# Patient Record
Sex: Male | Born: 1945 | Race: Black or African American | Hispanic: No | Marital: Married | Smoking: Former smoker
Health system: Southern US, Community
[De-identification: ages and names within clinical notes are randomized; demographics above are authoritative.]

## PROBLEM LIST (undated history)

## (undated) DIAGNOSIS — G8929 Other chronic pain: Secondary | ICD-10-CM

## (undated) DIAGNOSIS — M549 Dorsalgia, unspecified: Secondary | ICD-10-CM

## (undated) DIAGNOSIS — K219 Gastro-esophageal reflux disease without esophagitis: Secondary | ICD-10-CM

## (undated) DIAGNOSIS — K668 Other specified disorders of peritoneum: Secondary | ICD-10-CM

## (undated) DIAGNOSIS — I10 Essential (primary) hypertension: Secondary | ICD-10-CM

## (undated) DIAGNOSIS — K259 Gastric ulcer, unspecified as acute or chronic, without hemorrhage or perforation: Secondary | ICD-10-CM

## (undated) DIAGNOSIS — E119 Type 2 diabetes mellitus without complications: Secondary | ICD-10-CM

## (undated) DIAGNOSIS — G589 Mononeuropathy, unspecified: Secondary | ICD-10-CM

## (undated) DIAGNOSIS — M1990 Unspecified osteoarthritis, unspecified site: Secondary | ICD-10-CM

## (undated) HISTORY — PX: KNEE ARTHROSCOPY: SHX127

---

## 2000-07-02 ENCOUNTER — Emergency Department (HOSPITAL_COMMUNITY): Admission: EM | Admit: 2000-07-02 | Discharge: 2000-07-03 | Payer: Self-pay | Admitting: *Deleted

## 2001-05-29 ENCOUNTER — Ambulatory Visit (HOSPITAL_COMMUNITY): Admission: RE | Admit: 2001-05-29 | Discharge: 2001-05-29 | Payer: Self-pay | Admitting: General Surgery

## 2013-03-20 DIAGNOSIS — K668 Other specified disorders of peritoneum: Secondary | ICD-10-CM

## 2013-03-20 HISTORY — PX: CARPAL TUNNEL RELEASE: SHX101

## 2013-03-20 HISTORY — DX: Other specified disorders of peritoneum: K66.8

## 2013-10-30 ENCOUNTER — Inpatient Hospital Stay (HOSPITAL_COMMUNITY)
Admission: AD | Admit: 2013-10-30 | Discharge: 2013-11-09 | DRG: 357 | Disposition: A | Discharge status: Home / Self Care | Payer: 59 | Source: Other Acute Inpatient Hospital | Attending: Internal Medicine | Admitting: Internal Medicine

## 2013-10-30 ENCOUNTER — Encounter (HOSPITAL_COMMUNITY): Payer: Self-pay | Admitting: General Practice

## 2013-10-30 DIAGNOSIS — K560 Paralytic ileus: Secondary | ICD-10-CM | POA: Diagnosis present

## 2013-10-30 DIAGNOSIS — Z79899 Other long term (current) drug therapy: Secondary | ICD-10-CM | POA: Diagnosis not present

## 2013-10-30 DIAGNOSIS — K567 Ileus, unspecified: Secondary | ICD-10-CM

## 2013-10-30 DIAGNOSIS — E119 Type 2 diabetes mellitus without complications: Secondary | ICD-10-CM | POA: Diagnosis present

## 2013-10-30 DIAGNOSIS — R109 Unspecified abdominal pain: Secondary | ICD-10-CM | POA: Diagnosis present

## 2013-10-30 DIAGNOSIS — E876 Hypokalemia: Secondary | ICD-10-CM | POA: Diagnosis not present

## 2013-10-30 DIAGNOSIS — K9189 Other postprocedural complications and disorders of digestive system: Secondary | ICD-10-CM

## 2013-10-30 DIAGNOSIS — R188 Other ascites: Secondary | ICD-10-CM | POA: Diagnosis present

## 2013-10-30 DIAGNOSIS — F17200 Nicotine dependence, unspecified, uncomplicated: Secondary | ICD-10-CM | POA: Diagnosis present

## 2013-10-30 DIAGNOSIS — IMO0002 Reserved for concepts with insufficient information to code with codable children: Secondary | ICD-10-CM | POA: Diagnosis present

## 2013-10-30 DIAGNOSIS — Y838 Other surgical procedures as the cause of abnormal reaction of the patient, or of later complication, without mention of misadventure at the time of the procedure: Secondary | ICD-10-CM | POA: Diagnosis present

## 2013-10-30 DIAGNOSIS — R1900 Intra-abdominal and pelvic swelling, mass and lump, unspecified site: Secondary | ICD-10-CM | POA: Diagnosis present

## 2013-10-30 DIAGNOSIS — I10 Essential (primary) hypertension: Secondary | ICD-10-CM | POA: Diagnosis present

## 2013-10-30 DIAGNOSIS — K929 Disease of digestive system, unspecified: Secondary | ICD-10-CM | POA: Diagnosis present

## 2013-10-30 DIAGNOSIS — K651 Peritoneal abscess: Principal | ICD-10-CM | POA: Diagnosis present

## 2013-10-30 HISTORY — DX: Gastro-esophageal reflux disease without esophagitis: K21.9

## 2013-10-30 HISTORY — DX: Essential (primary) hypertension: I10

## 2013-10-30 HISTORY — DX: Mononeuropathy, unspecified: G58.9

## 2013-10-30 HISTORY — DX: Type 2 diabetes mellitus without complications: E11.9

## 2013-10-30 LAB — CBC WITH DIFFERENTIAL/PLATELET
Basophils Absolute: 0 10*3/uL (ref 0.0–0.1)
Basophils Relative: 0 % (ref 0–1)
EOS ABS: 0 10*3/uL (ref 0.0–0.7)
EOS PCT: 0 % (ref 0–5)
HCT: 40.3 % (ref 39.0–52.0)
Hemoglobin: 13.7 g/dL (ref 13.0–17.0)
LYMPHS ABS: 2.2 10*3/uL (ref 0.7–4.0)
Lymphocytes Relative: 20 % (ref 12–46)
MCH: 31.3 pg (ref 26.0–34.0)
MCHC: 34 g/dL (ref 30.0–36.0)
MCV: 92 fL (ref 78.0–100.0)
Monocytes Absolute: 0.6 10*3/uL (ref 0.1–1.0)
Monocytes Relative: 5 % (ref 3–12)
Neutro Abs: 8.3 10*3/uL — ABNORMAL HIGH (ref 1.7–7.7)
Neutrophils Relative %: 75 % (ref 43–77)
PLATELETS: 274 10*3/uL (ref 150–400)
RBC: 4.38 MIL/uL (ref 4.22–5.81)
RDW: 13 % (ref 11.5–15.5)
WBC: 11.1 10*3/uL — ABNORMAL HIGH (ref 4.0–10.5)

## 2013-10-30 LAB — GLUCOSE, CAPILLARY
Glucose-Capillary: 242 mg/dL — ABNORMAL HIGH (ref 70–99)
Glucose-Capillary: 277 mg/dL — ABNORMAL HIGH (ref 70–99)

## 2013-10-30 MED ORDER — INSULIN ASPART 100 UNIT/ML ~~LOC~~ SOLN
0.0000 [IU] | Freq: Three times a day (TID) | SUBCUTANEOUS | Status: DC
  Administered 2013-10-31: 5 [IU] via SUBCUTANEOUS

## 2013-10-30 MED ORDER — ONDANSETRON HCL 4 MG/2ML IJ SOLN
4.0000 mg | Freq: Four times a day (QID) | INTRAMUSCULAR | Status: DC | PRN
  Administered 2013-10-31 – 2013-11-07 (×8): 4 mg via INTRAVENOUS
  Filled 2013-10-30 (×9): qty 2

## 2013-10-30 MED ORDER — MORPHINE SULFATE 2 MG/ML IJ SOLN
1.0000 mg | INTRAMUSCULAR | Status: DC | PRN
  Administered 2013-10-31 (×2): 1 mg via INTRAVENOUS
  Filled 2013-10-30 (×3): qty 1

## 2013-10-30 MED ORDER — HYDRALAZINE HCL 20 MG/ML IJ SOLN: 10.0000 mg | INTRAMUSCULAR | Status: DC | PRN

## 2013-10-30 MED ORDER — ACETAMINOPHEN 325 MG PO TABS
650.0000 mg | ORAL_TABLET | Freq: Four times a day (QID) | ORAL | Status: DC | PRN
  Administered 2013-11-02 – 2013-11-09 (×2): 650 mg via ORAL
  Filled 2013-10-30 (×2): qty 2

## 2013-10-30 MED ORDER — METRONIDAZOLE IN NACL 5-0.79 MG/ML-% IV SOLN
500.0000 mg | Freq: Three times a day (TID) | INTRAVENOUS | Status: DC
  Administered 2013-10-31 – 2013-11-09 (×28): 500 mg via INTRAVENOUS
  Filled 2013-10-30 (×29): qty 100

## 2013-10-30 MED ORDER — ONDANSETRON HCL 4 MG PO TABS: 4.0000 mg | ORAL_TABLET | Freq: Four times a day (QID) | ORAL | Status: DC | PRN

## 2013-10-30 MED ORDER — ACETAMINOPHEN 650 MG RE SUPP: 650.0000 mg | Freq: Four times a day (QID) | RECTAL | Status: DC | PRN

## 2013-10-30 MED ORDER — SODIUM CHLORIDE 0.9 % IV SOLN
INTRAVENOUS | Status: DC
  Administered 2013-10-30: via INTRAVENOUS

## 2013-10-30 MED ORDER — CIPROFLOXACIN IN D5W 400 MG/200ML IV SOLN
400.0000 mg | INTRAVENOUS | Status: AC
  Administered 2013-10-31: 400 mg via INTRAVENOUS
  Filled 2013-10-30: qty 200

## 2013-10-30 NOTE — Progress Notes (Signed)
Pt arrive to unit in no s/s of distress via EMS. Pt transported from [facility]. Pt A&Ox4. Pt oriented to unit and room. Whiteboard updated. VS stable. RR elevated - pt in no s/s of distress. BP low - still stable. Report received from previous nurse prior to pt's arrival. Callbell within reach. Will continue to monitor. Admitting doctor paged.

## 2013-10-30 NOTE — Progress Notes (Signed)
ANTIBIOTIC CONSULT NOTE - INITIAL  Pharmacy Consult for cipro Indication: intra-abdominal infection  Not on File  Patient Measurements: Height: 5\' 6"  (167.6 cm) Weight: 155 lb 10.3 oz (70.6 kg) IBW/kg (Calculated) : 63.8   Vital Signs: Temp: 99.2 F (37.3 C) (08/13 2152) Temp src: Oral (08/13 2152) BP: 99/65 mmHg (08/13 2152) Pulse Rate: 88 (08/13 2152) Intake/Output from previous day:   Intake/Output from this shift:    Labs: No results found for this basename: WBC, HGB, PLT, LABCREA, CREATININE,  in the last 72 hours CrCl is unknown because no creatinine reading has been taken. No results found for this basename: VANCOTROUGH, VANCOPEAK, VANCORANDOM, GENTTROUGH, GENTPEAK, GENTRANDOM, TOBRATROUGH, TOBRAPEAK, TOBRARND, AMIKACINPEAK, AMIKACINTROU, AMIKACIN,  in the last 72 hours   Microbiology: No results found for this or any previous visit (from the past 720 hour(s)).  Medical History: Past Medical History  Diagnosis Date  . Hypertension   . Type II diabetes mellitus   . GERD (gastroesophageal reflux disease)   . Pinched nerve     "back" (10/30/2013)    Medications:  See electronic EMR  Assessment: 68 year old male with abdominal abscess ordered empiric cipro and flagyl. No fever noted, wbc normal at 11.1. Renal function normal, pharmacy to sign off as it does not appear that patient will need any further dose adjustments. Reconsult if needed.  Goal of Therapy:  Eradication of infection  Plan:  Cipro 400mg  q12h  Erin Hearing PharmD., BCPS Clinical Pharmacist Pager [phone] 10/30/2013 10:57 PM

## 2013-10-30 NOTE — H&P (Signed)
[facility] History and Physical  METE PURDUM WUJ:[id number] DOB: 1945/04/17 DOA: 10/30/2013  Referring physician: Patient was transferred from [facility] hospital. PCP: Fanny Bien, MD  Chief Complaint: Abdominal pain.  HPI: AMBERS Cross is a 68 y.o. male with history of diabetes mellitus and hypertension had presented to the ER at [facility] with complaints of abdominal pain. Patient has been experiencing abdominal pain over the last one month but it had acutely worsened over last 24 hours and patient also had some nausea vomiting. Patient has had some subjective feeling of fever and chills. Pain most recent left lower quadrant radiating across the abdomen to the right lower quadrant. Patient also has pain in the left lower extremity. CT abdomen pelvis done showed left omental mass with necrosis and patient was transferred to [facility] for further management. On exam patient has left lower quadrant tenderness. Patient's last bowel movement was 2 days ago. Denies any chest pain or shortness of breath. Patient is mildly febrile. Labs done at [facility] were largely unremarkable except for mild leukocytosis. Patient states he has had colonoscopy last year which was unremarkable and as per the patient.   Review of Systems: As presented in the history of presenting illness, rest negative.  Past Medical History  Diagnosis Date  . Hypertension   . Type II diabetes mellitus   . GERD (gastroesophageal reflux disease)   . Pinched nerve     "back" (10/30/2013)   Past Surgical History  Procedure Laterality Date  . Carpal tunnel release Left 03/2013  . Knee arthroscopy Left 1980's   Social History:  reports that he has been smoking Cigarettes.  He has a 56 pack-year smoking history. He has never used smokeless tobacco. He reports that he does not drink alcohol or use illicit drugs. Where does patient live home. Can patient participate in ADLs? Yes.  Not on  File  Family History:  Family History  Problem Relation Age of Onset  . Diabetes Mellitus II Sister   . Stroke Neg Hx       Prior to Admission medications   Medication Sig Start Date End Date Taking? Authorizing Provider  glyBURIDE (DIABETA) 5 MG tablet Take 10 mg by mouth 2 (two) times daily. 09/30/13  Yes Historical Provider, MD  lisinopril (PRINIVIL,ZESTRIL) 40 MG tablet Take 40 mg by mouth daily. 09/27/13  Yes Historical Provider, MD    Physical Exam: Filed Vitals:   10/30/13 2152  BP: 99/65  Pulse: 88  Temp: 99.2 F (37.3 C)  TempSrc: Oral  Resp: 24  Height: 5\' 6"  (1.676 m)  Weight: 70.6 kg (155 lb 10.3 oz)  SpO2: 97%     General:  Moderately built and nourished.  Eyes: Anicteric no pallor.  ENT: No discharge from ears eyes nose mouth.  Neck: No mass felt.  Cardiovascular: S1-S2 heard.  Respiratory: No rhonchi or crepitations.  Abdomen: Soft tenderness in the left lower quadrant with guarding no rigidity. Bowel sounds not appreciated.  Skin: No rash.  Musculoskeletal: No edema. Poor pulses.  Psychiatric: Appears normal.  Neurologic: Alert awake oriented to time place and person. Moves all extremities.  Labs on Admission:  Basic Metabolic Panel: No results found for this basename: NA, K, CL, CO2, GLUCOSE, BUN, CREATININE, CALCIUM, MG, PHOS,  in the last 168 hours Liver Function Tests: No results found for this basename: AST, ALT, ALKPHOS, BILITOT, PROT, ALBUMIN,  in the last 168 hours No results found for this basename: LIPASE, AMYLASE,  in the  last 168 hours No results found for this basename: AMMONIA,  in the last 168 hours CBC: No results found for this basename: WBC, NEUTROABS, HGB, HCT, MCV, PLT,  in the last 168 hours Cardiac Enzymes: No results found for this basename: CKTOTAL, CKMB, CKMBINDEX, TROPONINI,  in the last 168 hours  BNP (last 3 results) No results found for this basename: PROBNP,  in the last 8760 hours CBG:  Recent Labs Lab  10/30/13 2155  GLUCAP 277*    Radiological Exams on Admission: No results found.   Assessment/Plan Principal Problem:   Abdominal abscess Active Problems:   HTN (hypertension)   Diabetes mellitus   Abdominal mass   1. 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 Prince Solian. At this time patient will be kept n.p.o. and since patient has mild fever with leukocytosis I have placed patient on empiric antibiotics. Pain relief medication and gentle hydration. Patient's blood pressure is in the low-normal but does not look septic or toxic at this time. 2. Diabetes mellitus - since patient is n.p.o. I have placed patient on sliding-scale coverage for now. Closely follow CBGs. 3. Hypertension - patient's blood pressure is in the low-normal. Hold antihypertensives. Continue hydration. When necessary IV hydralazine for systolic blood pressure more than 160.  Patient's repeat labs are pending.  Code Status: Full code.  Family Communication: Patient's daughter at the bedside.  Disposition Plan: Admit to inpatient.    Ceceilia Cephus N. [facility] Pager [phone].  If 7PM-7AM, please contact night-coverage [email or web address] Password [email or web address] 10/30/2013, 11:30 PM

## 2013-10-31 DIAGNOSIS — D490 Neoplasm of unspecified behavior of digestive system: Secondary | ICD-10-CM

## 2013-10-31 LAB — BASIC METABOLIC PANEL
Anion gap: 16 — ABNORMAL HIGH (ref 5–15)
BUN: 11 mg/dL (ref 6–23)
CALCIUM: 9.5 mg/dL (ref 8.4–10.5)
CO2: 24 mEq/L (ref 19–32)
Chloride: 98 mEq/L (ref 96–112)
Creatinine, Ser: 0.95 mg/dL (ref 0.50–1.35)
GFR calc Af Amer: >90 mL/min (ref >90)
GFR, EST NON AFRICAN AMERICAN: 85 mL/min — AB (ref >90)
Glucose, Bld: 266 mg/dL — ABNORMAL HIGH (ref 70–99)
Potassium: 4.1 mEq/L (ref 3.7–5.3)
Sodium: 138 mEq/L (ref 137–147)

## 2013-10-31 LAB — GLUCOSE, CAPILLARY
GLUCOSE-CAPILLARY: 275 mg/dL — AB (ref 70–99)
GLUCOSE-CAPILLARY: 207 mg/dL — AB (ref 70–99)
GLUCOSE-CAPILLARY: 211 mg/dL — AB (ref 70–99)
Glucose-Capillary: 245 mg/dL — ABNORMAL HIGH (ref 70–99)
Glucose-Capillary: 183 mg/dL — ABNORMAL HIGH (ref 70–99)
Glucose-Capillary: 213 mg/dL — ABNORMAL HIGH (ref 70–99)

## 2013-10-31 LAB — COMPREHENSIVE METABOLIC PANEL
ALK PHOS: 75 U/L (ref 39–117)
ALT: 15 U/L (ref 0–53)
AST: 10 U/L (ref 0–37)
Albumin: 2.5 g/dL — ABNORMAL LOW (ref 3.5–5.2)
Anion gap: 13 (ref 5–15)
BUN: 10 mg/dL (ref 6–23)
CHLORIDE: 100 meq/L (ref 96–112)
CO2: 25 meq/L (ref 19–32)
Calcium: 9.5 mg/dL (ref 8.4–10.5)
Creatinine, Ser: 0.95 mg/dL (ref 0.50–1.35)
GFR calc Af Amer: >90 mL/min (ref >90)
GFR calc non Af Amer: 85 mL/min — ABNORMAL LOW (ref >90)
Glucose, Bld: 260 mg/dL — ABNORMAL HIGH (ref 70–99)
Potassium: 4.2 mEq/L (ref 3.7–5.3)
SODIUM: 138 meq/L (ref 137–147)
Total Bilirubin: 0.3 mg/dL (ref 0.3–1.2)
Total Protein: 6.5 g/dL (ref 6.0–8.3)

## 2013-10-31 LAB — CBC
HCT: 40.9 % (ref 39.0–52.0)
Hemoglobin: 13.9 g/dL (ref 13.0–17.0)
MCH: 31.4 pg (ref 26.0–34.0)
MCHC: 34 g/dL (ref 30.0–36.0)
MCV: 92.3 fL (ref 78.0–100.0)
PLATELETS: 291 10*3/uL (ref 150–400)
RBC: 4.43 MIL/uL (ref 4.22–5.81)
RDW: 13.1 % (ref 11.5–15.5)
WBC: 10.1 10*3/uL (ref 4.0–10.5)

## 2013-10-31 MED ORDER — INSULIN GLARGINE 100 UNIT/ML ~~LOC~~ SOLN: 12.0000 [IU] | Freq: Every day | SUBCUTANEOUS | Status: DC

## 2013-10-31 MED ORDER — INSULIN ASPART 100 UNIT/ML ~~LOC~~ SOLN
0.0000 [IU] | SUBCUTANEOUS | Status: DC
  Administered 2013-10-31: 2 [IU] via SUBCUTANEOUS
  Administered 2013-10-31 (×2): 3 [IU] via SUBCUTANEOUS
  Administered 2013-11-01 (×2): 2 [IU] via SUBCUTANEOUS
  Administered 2013-11-01: 3 [IU] via SUBCUTANEOUS
  Administered 2013-11-01 (×2): 2 [IU] via SUBCUTANEOUS
  Administered 2013-11-01: 1 [IU] via SUBCUTANEOUS
  Administered 2013-11-01: 2 [IU] via SUBCUTANEOUS
  Administered 2013-11-02 (×5): 1 [IU] via SUBCUTANEOUS
  Administered 2013-11-03: 2 [IU] via SUBCUTANEOUS
  Administered 2013-11-03: 3 [IU] via SUBCUTANEOUS
  Administered 2013-11-03 – 2013-11-05 (×8): 2 [IU] via SUBCUTANEOUS
  Administered 2013-11-05: 1 [IU] via SUBCUTANEOUS
  Administered 2013-11-05 – 2013-11-06 (×5): 2 [IU] via SUBCUTANEOUS
  Administered 2013-11-06: 1 [IU] via SUBCUTANEOUS
  Administered 2013-11-06 (×2): 2 [IU] via SUBCUTANEOUS
  Administered 2013-11-07: 1 [IU] via SUBCUTANEOUS
  Administered 2013-11-07: 3 [IU] via SUBCUTANEOUS
  Administered 2013-11-07: 5 [IU] via SUBCUTANEOUS
  Administered 2013-11-07: 1 [IU] via SUBCUTANEOUS

## 2013-10-31 MED ORDER — HEPARIN SODIUM (PORCINE) 5000 UNIT/ML IJ SOLN
5000.0000 [IU] | Freq: Three times a day (TID) | INTRAMUSCULAR | Status: DC
  Administered 2013-10-31 – 2013-11-02 (×8): 5000 [IU] via SUBCUTANEOUS
  Filled 2013-10-31 (×11): qty 1

## 2013-10-31 MED ORDER — WHITE PETROLATUM GEL
Status: AC
  Administered 2013-10-31: 0.2
  Filled 2013-10-31: qty 5

## 2013-10-31 MED ORDER — SODIUM CHLORIDE 0.9 % IV SOLN
INTRAVENOUS | Status: AC
  Administered 2013-10-31 – 2013-11-01 (×3): via INTRAVENOUS

## 2013-10-31 MED ORDER — INSULIN GLARGINE 100 UNIT/ML ~~LOC~~ SOLN
8.0000 [IU] | Freq: Every day | SUBCUTANEOUS | Status: DC
  Administered 2013-10-31 – 2013-11-01 (×2): 8 [IU] via SUBCUTANEOUS
  Filled 2013-10-31 (×3): qty 0.08

## 2013-10-31 MED ORDER — CIPROFLOXACIN IN D5W 400 MG/200ML IV SOLN
400.0000 mg | Freq: Two times a day (BID) | INTRAVENOUS | Status: DC
  Administered 2013-10-31 – 2013-11-04 (×10): 400 mg via INTRAVENOUS
  Filled 2013-10-31 (×12): qty 200

## 2013-10-31 NOTE — Care Management Note (Signed)
    Page 1 of 1   11/06/2013     11:12:36 AM CARE MANAGEMENT NOTE 11/06/2013  Patient:  Adrian Cross, Adrian Cross   Account Number:  [id number]  Date Initiated:  10/31/2013  Documentation initiated by:  Tomi Bamberger  Subjective/Objective Assessment:   dx tumor abd, n/v  admit- lives with family.     Action/Plan:   Anticipated DC Date:  11/07/2013   Anticipated DC Plan:  [place]  CM consult      Choice offered to / List presented to:             Status of service:  In process, will continue to follow Medicare Important Message given?  YES (If response is "NO", the following Medicare IM given date fields will be blank) Date Medicare IM given:  11/03/2013 Medicare IM given by:  Tomi Bamberger Date Additional Medicare IM given:  11/06/2013 Additional Medicare IM given by:  Tomi Bamberger  Discharge Disposition:  HOME/SELF CARE  Per UR Regulation:  Reviewed for med. necessity/level of care/duration of stay  If discussed at [place] of Stay Meetings, dates discussed:   11/04/2013  11/06/2013    Comments:  11/06/13 [place], BSN [phone] patient with post op ileus, pca dc'd yeste, patient vomited last pm, will cont to try to advance diet.  11/04/13 [place], BSN [phone] s/p exploratory laporatomy and rescetion of omental mass. Conts on pca, sips and clears. NCM will continue to folow for dc needs.

## 2013-10-31 NOTE — Progress Notes (Signed)
Utilization review completed. Nyron Mozer, RN, BSN. 

## 2013-10-31 NOTE — Progress Notes (Signed)
[facility] Progress Note     Subjective: Pt c/o pain in his abdomen most significantly in the LLQ, but mildly all over.  No N/V, having flatus, no BM since 10/28/13.    Objective: Vital signs in last 24 hours: Temp:  [99.2 F (37.3 C)] 99.2 F (37.3 C) (08/14 0423) Pulse Rate:  [88-89] 89 (08/14 0423) Resp:  [20-24] 20 (08/14 0423) BP: (99-103)/(57-65) 103/57 mmHg (08/14 0423) SpO2:  [97 %-98 %] 98 % (08/14 0423) Weight:  [155 lb 10.3 oz (70.6 kg)] 155 lb 10.3 oz (70.6 kg) (08/13 2152) Last BM Date: 10/28/13  Intake/Output from previous day: 08/13 0701 - 08/14 0700 In: 462.5 [I.V.:462.5] Out: 50 [Urine:50] Intake/Output this shift:    PE: Gen:  Alert, NAD, pleasant Abd: Soft, distended, mass in LLQ, very tender to palpation in LLQ, +BS, no HSM, no abdominal scars noted   Lab Results:   Recent Labs  10/30/13 2348 10/31/13 0508  WBC 11.1* 10.1  HGB 13.7 13.9  HCT 40.3 40.9  PLT 274 291   BMET  Recent Labs  10/30/13 2348 10/31/13 0508  NA 138 138  K 4.2 4.1  CL 100 98  CO2 25 24  GLUCOSE 260* 266*  BUN 10 11  CREATININE 0.95 0.95  CALCIUM 9.5 9.5   PT/INR No results found for this basename: LABPROT, INR,  in the last 72 hours CMP     Component Value Date/Time   NA 138 10/31/2013 0508   K 4.1 10/31/2013 0508   CL 98 10/31/2013 0508   CO2 24 10/31/2013 0508   GLUCOSE 266* 10/31/2013 0508   BUN 11 10/31/2013 0508   CREATININE 0.95 10/31/2013 0508   CALCIUM 9.5 10/31/2013 0508   PROT 6.5 10/30/2013 2348   ALBUMIN 2.5* 10/30/2013 2348   AST 10 10/30/2013 2348   ALT 15 10/30/2013 2348   ALKPHOS 75 10/30/2013 2348   BILITOT 0.3 10/30/2013 2348   GFRNONAA 85* 10/31/2013 0508   GFRAA >90 10/31/2013 0508   Lipase  No results found for this basename: lipase       Studies/Results: No results found.  Anti-infectives: Anti-infectives   Start     Dose/Rate Route Frequency Ordered Stop   10/31/13 1000  ciprofloxacin (CIPRO) IVPB 400 mg     400  mg 200 mL/hr over 60 Minutes Intravenous Every 12 hours 10/31/13 0022     10/31/13 0000  metroNIDAZOLE (FLAGYL) IVPB 500 mg     500 mg 100 mL/hr over 60 Minutes Intravenous Every 8 hours 10/30/13 2252     10/31/13 0000  ciprofloxacin (CIPRO) IVPB 400 mg     400 mg 200 mL/hr over 60 Minutes Intravenous NOW 10/30/13 2255 10/31/13 0111       Assessment/Plan 6.4 x 5.1 cm LLQ necrotic omental mass - now causing increasing tenderness and leukocytosis   Plan: 1.  While CT-guided biopsy could be done, it is doubtful that any findings on biopsy would keep him from needing to have this area resected.  2.  Will need exploratory laparotomy with resection of this omental mass within the next few days.  Dr. Donne Hazel and Dr. Georgette Dover to decide timing - likely this weekend. 3.  Keep him NPO for now 4.  WBC normal 5.  Encourage ambulation and IS 6.  SCD's and not currently on blood thinners (dvt proph would be fine, but hold after midnight) 7.  Daughter at bedside - we discussed plans for surgery and they are both agreeable to  proceed with surgery    LOS: 1 day    Adrian Cross, Adrian Cross 10/31/2013, 9:55 AM Pager: [phone]

## 2013-10-31 NOTE — Consult Note (Signed)
Reason for Consult:Left lower quadrant abdominal omental mass with central necrosis Referring Physician: Dr. Sharlet Salina is an 68 y.o. male.  HPI: This is a 68 yo male with DM2 and chronic tobacco use that presents with about a month of LLQ abdominal pain that has become worse over the last couple of days.  The pain has now radiated across to his RLQ.  He feels distended.  His last BM was two days ago.  He reports some vomiting.  He presented to the emergency department at [facility] in [place], [place] where a CT scan showed a 6.4 x 5.1 cm necrotizing mass in the LLQ omentum.  WBC mildly elevated at 10.9.  He was then transferred to the [facility] service for further work-up.  Currently the patient is resting comfortably with minimal pain medication.  Past Medical History  Diagnosis Date  . Hypertension   . Type II diabetes mellitus   . GERD (gastroesophageal reflux disease)   . Pinched nerve     "back" (10/30/2013)    Past Surgical History  Procedure Laterality Date  . Carpal tunnel release Left 03/2013  . Knee arthroscopy Left 1980's    Family History  Problem Relation Age of Onset  . Diabetes Mellitus II Sister   . Stroke Neg Hx     Social History:  reports that he has been smoking Cigarettes.  He has a 56 pack-year smoking history. He has never used smokeless tobacco. He reports that he does not drink alcohol or use illicit drugs.  Allergies: Not on File  Medications:   Prior to Admission medications   Medication Sig Start Date End Date Taking? Authorizing Provider  glyBURIDE (DIABETA) 5 MG tablet Take 10 mg by mouth 2 (two) times daily. 09/30/13  Yes Historical Provider, MD  lisinopril (PRINIVIL,ZESTRIL) 40 MG tablet Take 40 mg by mouth daily. 09/27/13  Yes Historical Provider, MD     Results for orders placed during the hospital encounter of 10/30/13 (from the past 48 hour(s))  GLUCOSE, CAPILLARY     Status: Abnormal   Collection Time     10/30/13  9:55 PM      Result Value Ref Range   Glucose-Capillary 277 (*) 70 - 99 mg/dL   Comment 1 Notify RN     Comment 2 Documented in Chart    COMPREHENSIVE METABOLIC PANEL     Status: Abnormal   Collection Time    10/30/13 11:48 PM      Result Value Ref Range   Sodium 138  137 - 147 mEq/L   Potassium 4.2  3.7 - 5.3 mEq/L   Chloride 100  96 - 112 mEq/L   CO2 25  19 - 32 mEq/L   Glucose, Bld 260 (*) 70 - 99 mg/dL   BUN 10  6 - 23 mg/dL   Creatinine, Ser 0.95  0.50 - 1.35 mg/dL   Calcium 9.5  8.4 - 10.5 mg/dL   Total Protein 6.5  6.0 - 8.3 g/dL   Albumin 2.5 (*) 3.5 - 5.2 g/dL   AST 10  0 - 37 U/L   ALT 15  0 - 53 U/L   Alkaline Phosphatase 75  39 - 117 U/L   Total Bilirubin 0.3  0.3 - 1.2 mg/dL   GFR calc non Af Amer 85 (*) >90 mL/min   GFR calc Af Amer >90  >90 mL/min   Comment: (NOTE)     The eGFR has been calculated using the  CKD EPI equation.     This calculation has not been validated in all clinical situations.     eGFR's persistently <90 mL/min signify possible Chronic Kidney     Disease.   Anion gap 13  5 - 15  CBC WITH DIFFERENTIAL     Status: Abnormal   Collection Time    10/30/13 11:48 PM      Result Value Ref Range   WBC 11.1 (*) 4.0 - 10.5 K/uL   RBC 4.38  4.22 - 5.81 MIL/uL   Hemoglobin 13.7  13.0 - 17.0 g/dL   HCT 40.3  39.0 - 52.0 %   MCV 92.0  78.0 - 100.0 fL   MCH 31.3  26.0 - 34.0 pg   MCHC 34.0  30.0 - 36.0 g/dL   RDW 13.0  11.5 - 15.5 %   Platelets 274  150 - 400 K/uL   Neutrophils Relative % 75  43 - 77 %   Neutro Abs 8.3 (*) 1.7 - 7.7 K/uL   Lymphocytes Relative 20  12 - 46 %   Lymphs Abs 2.2  0.7 - 4.0 K/uL   Monocytes Relative 5  3 - 12 %   Monocytes Absolute 0.6  0.1 - 1.0 K/uL   Eosinophils Relative 0  0 - 5 %   Eosinophils Absolute 0.0  0.0 - 0.7 K/uL   Basophils Relative 0  0 - 1 %   Basophils Absolute 0.0  0.0 - 0.1 K/uL  GLUCOSE, CAPILLARY     Status: Abnormal   Collection Time    10/30/13 11:55 PM      Result Value Ref Range    Glucose-Capillary 242 (*) 70 - 99 mg/dL    CT Chest - Mild bibasilar atelectasis and central peribronchial thickening.  Underlying emphysematous changes.  No evidence of pulmonary tumor or metastases.  CT Abd/ Pelvis - Necrotic mass int he left lower quadrant appears to lie within the omentum.  The lesion could represent metastatic disease although no primary tumor is identified.  Additional differential considerations include GI stromal tumor or sarcoma.  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  Associated small volume of perihepatic and pelvic ascites is noted.  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  Review of Systems  Constitutional: Negative for weight loss.  HENT: Negative for ear discharge, ear pain, hearing loss and tinnitus.   Eyes: Negative for blurred vision, double vision, photophobia and pain.  Respiratory: Negative for cough, sputum production and shortness of breath.   Cardiovascular: Negative for chest pain.  Gastrointestinal: Positive for nausea, vomiting, abdominal pain and constipation.  Genitourinary: Negative for dysuria, urgency, frequency and flank pain.  Musculoskeletal: Negative for back pain, falls, joint pain, myalgias and neck pain.  Neurological: Negative for dizziness, tingling, sensory change, focal weakness, loss of consciousness and headaches.  Endo/Heme/Allergies: Does not bruise/bleed easily.  Psychiatric/Behavioral: Negative for depression, memory loss and substance abuse. The patient is not nervous/anxious.    Blood pressure 99/65, pulse 88, temperature 99.2 F (37.3 C), temperature source Oral, resp. rate 24, height 5' 6"  (1.676 m), weight 155 lb 10.3 oz (70.6 kg), SpO2 97.00%. Physical Exam WDWN in NAD HEENT:  EOMI, sclera anicteric Neck:  No masses, no  thyromegaly Lungs:  CTA bilaterally; normal respiratory effort CV:  Regular rate and rhythm; no murmurs Abd:  +bowel sounds,  distended; tender mostly in LLQ, but also in RLQ; no palpable mass Ext:  Well-perfused; no edema Skin:  Warm, dry; no sign of jaundice  Assessment/Plan: LLQ necrotic omental mass - now causing increasing tenderness and leukocytosis  While CT-guided biopsy could be done, it is doubtful that any findings on biopsy would keep him from needing to have this area resected.  Will discuss further with the daytime Surgeon, but he will likely need exploratory laparotomy with resection of this omental mass within the next few days.    Keep him NPO for now.  Imogene Burn. Georgette Dover, MD, [facility]  General/ Trauma Surgery  10/31/2013 1:01 AM   Katlyn Muldrew K. 10/31/2013, 12:50 AM

## 2013-10-31 NOTE — Progress Notes (Signed)
Agree with Dr Vonna Kotyk note, I think laparotomy with resection indicated.  Will try to prep him today and possibly for surgery tomorrow.

## 2013-10-31 NOTE — Progress Notes (Signed)
Patient Demographics  Adrian Cross, is a 68 y.o. male, DOB - Aug 17, 1945, HQP:[id number]  Admit date - 10/30/2013   Admitting Physician Bonnielee Haff, MD  Outpatient Primary MD for the patient is HILL,CHARLES, MD  LOS - 1   No chief complaint on file.       Subjective:   Adrian Cross today has, No headache, No chest pain, +ve LLQ abdominal pain - No Nausea, No new weakness tingling or numbness, No Cough - SOB.   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 and on IVF + epiric antibiotics. Pain relief medication and gentle hydration. CCS following likely exploratory laparotomy in am.    Diabetes mellitus  2 - since patient is n.p.o. I have placed patient on sliding-scale coverage for now + low dose Lantus. Closely follow CBGs.   CBG (last 3)   Recent Labs  10/30/13 2355 10/31/13 0422 10/31/13 0746  GLUCAP 242* 245* 275*      Hypertension - patient's blood pressure is in the low-normal. Hold antihypertensives. Continue hydration. When necessary IV hydralazine for systolic blood pressure more than 160.      Code Status: full  Family Communication: none  Disposition Plan: home   Procedures Ct abd-pelvis,    Consults CCS   Medications  Scheduled Meds: . ciprofloxacin  400 mg Intravenous Q12H  . heparin subcutaneous  5,000 Units Subcutaneous 3 times per day  . insulin aspart  0-9 Units Subcutaneous TID WC  . metronidazole  500 mg Intravenous Q8H   Continuous Infusions: . sodium chloride     PRN Meds:.acetaminophen, acetaminophen, hydrALAZINE, morphine injection, ondansetron (ZOFRAN) IV, ondansetron  DVT Prophylaxis   Heparin - SCDs    Lab Results  Component Value Date   PLT 291 10/31/2013     Antibiotics     Anti-infectives   Start     Dose/Rate Route Frequency Ordered Stop   10/31/13 1000  ciprofloxacin (CIPRO) IVPB 400 mg     400 mg 200 mL/hr over 60 Minutes Intravenous Every 12 hours 10/31/13 0022     10/31/13 0000  metroNIDAZOLE (FLAGYL) IVPB 500 mg     500 mg 100 mL/hr over 60 Minutes Intravenous Every 8 hours 10/30/13 2252     10/31/13 0000  ciprofloxacin (CIPRO) IVPB 400 mg     400 mg 200 mL/hr over 60 Minutes Intravenous NOW 10/30/13 2255 10/31/13 0111          Objective:   Filed Vitals:   10/30/13 2152 10/31/13 0423  BP: 99/65 103/57  Pulse: 88 89  Temp: 99.2 F (37.3 C) 99.2 F (37.3 C)  TempSrc: Oral Oral  Resp: 24 20  Height: 5\' 6"  (1.676 m)   Weight: 70.6 kg (155 lb 10.3 oz)   SpO2: 97% 98%    Wt Readings from Last 3 Encounters:  10/30/13 70.6 kg (155 lb 10.3 oz)     Intake/Output Summary (Last 24 hours) at 10/31/13 1106 Last data filed at 10/31/13 0601  Gross per 24 hour  Intake  462.5 ml  Output     50 ml  Net  412.5 ml     Physical Exam  Awake  Alert, Oriented X 3, No new F.N deficits, Normal affect [place].AT,PERRAL Supple Neck,No JVD, No cervical lymphadenopathy appriciated.  Symmetrical Chest wall movement, Good air movement bilaterally, CTAB RRR,No Gallops,Rubs or new Murmurs, No Parasternal Heave +ve B.Sounds, Abd Soft, +ve LLQ tenderness, No organomegaly appriciated, No rebound - guarding or rigidity. No Cyanosis, Clubbing or edema, No new Rash or bruise      Data Review   Micro Results No results found for this or any previous visit (from the past 240 hour(s)).  Radiology Reports No results found.  CBC  Recent Labs Lab 10/30/13 2348 10/31/13 0508  WBC 11.1* 10.1  HGB 13.7 13.9  HCT 40.3 40.9  PLT 274 291  MCV 92.0 92.3  MCH 31.3 31.4  MCHC 34.0 34.0  RDW 13.0 13.1  LYMPHSABS 2.2  --   MONOABS 0.6  --   EOSABS 0.0  --   BASOSABS 0.0  --     Chemistries   Recent Labs Lab 10/30/13 2348  10/31/13 0508  NA 138 138  K 4.2 4.1  CL 100 98  CO2 25 24  GLUCOSE 260* 266*  BUN 10 11  CREATININE 0.95 0.95  CALCIUM 9.5 9.5  AST 10  --   ALT 15  --   ALKPHOS 75  --   BILITOT 0.3  --    ------------------------------------------------------------------------------------------------------------------ estimated creatinine clearance is 69 ml/min (by C-G formula based on Cr of 0.95). ------------------------------------------------------------------------------------------------------------------ No results found for this basename: HGBA1C,  in the last 72 hours ------------------------------------------------------------------------------------------------------------------ No results found for this basename: CHOL, HDL, LDLCALC, TRIG, CHOLHDL, LDLDIRECT,  in the last 72 hours ------------------------------------------------------------------------------------------------------------------ No results found for this basename: TSH, T4TOTAL, FREET3, T3FREE, THYROIDAB,  in the last 72 hours ------------------------------------------------------------------------------------------------------------------ No results found for this basename: VITAMINB12, FOLATE, FERRITIN, TIBC, IRON, RETICCTPCT,  in the last 72 hours  Coagulation profile No results found for this basename: INR, PROTIME,  in the last 168 hours  No results found for this basename: DDIMER,  in the last 72 hours  Cardiac Enzymes No results found for this basename: CK, CKMB, TROPONINI, MYOGLOBIN,  in the last 168 hours ------------------------------------------------------------------------------------------------------------------ No components found with this basename: POCBNP,      Time Spent in minutes   35   Adrian Cross K M.D on 10/31/2013 at 11:06 AM  Between 7am to 7pm - Pager - [phone]  After 7pm go to [email or web address] - password [email or web address]  And look for the night coverage person covering for me after  hours  [facility] Office  [phone]   **Disclaimer: This note may have been dictated with voice recognition software. Similar sounding words can inadvertently be transcribed and this note may contain transcription errors which may not have been corrected upon publication of note.**

## 2013-11-01 ENCOUNTER — Inpatient Hospital Stay (HOSPITAL_COMMUNITY): Payer: 59

## 2013-11-01 LAB — COMPREHENSIVE METABOLIC PANEL
ALK PHOS: 84 U/L (ref 39–117)
ALT: 10 U/L (ref 0–53)
AST: 9 U/L (ref 0–37)
Albumin: 2.2 g/dL — ABNORMAL LOW (ref 3.5–5.2)
Anion gap: 16 — ABNORMAL HIGH (ref 5–15)
BUN: 13 mg/dL (ref 6–23)
CO2: 21 meq/L (ref 19–32)
Calcium: 10.3 mg/dL (ref 8.4–10.5)
Chloride: 100 mEq/L (ref 96–112)
Creatinine, Ser: 1.05 mg/dL (ref 0.50–1.35)
GFR calc Af Amer: 83 mL/min — ABNORMAL LOW (ref >90)
GFR, EST NON AFRICAN AMERICAN: 72 mL/min — AB (ref >90)
Glucose, Bld: 169 mg/dL — ABNORMAL HIGH (ref 70–99)
POTASSIUM: 4.5 meq/L (ref 3.7–5.3)
SODIUM: 137 meq/L (ref 137–147)
Total Bilirubin: 0.3 mg/dL (ref 0.3–1.2)
Total Protein: 6.5 g/dL (ref 6.0–8.3)

## 2013-11-01 LAB — GLUCOSE, CAPILLARY
GLUCOSE-CAPILLARY: 165 mg/dL — AB (ref 70–99)
GLUCOSE-CAPILLARY: 172 mg/dL — AB (ref 70–99)
Glucose-Capillary: 148 mg/dL — ABNORMAL HIGH (ref 70–99)
Glucose-Capillary: 167 mg/dL — ABNORMAL HIGH (ref 70–99)
Glucose-Capillary: 179 mg/dL — ABNORMAL HIGH (ref 70–99)
Glucose-Capillary: 186 mg/dL — ABNORMAL HIGH (ref 70–99)

## 2013-11-01 LAB — CBC
HCT: 42.4 % (ref 39.0–52.0)
HEMOGLOBIN: 14.2 g/dL (ref 13.0–17.0)
MCH: 31.3 pg (ref 26.0–34.0)
MCHC: 33.5 g/dL (ref 30.0–36.0)
MCV: 93.4 fL (ref 78.0–100.0)
PLATELETS: 301 10*3/uL (ref 150–400)
RBC: 4.54 MIL/uL (ref 4.22–5.81)
RDW: 13.2 % (ref 11.5–15.5)
WBC: 12.7 10*3/uL — AB (ref 4.0–10.5)

## 2013-11-01 LAB — SURGICAL PCR SCREEN
MRSA, PCR: NEGATIVE
STAPHYLOCOCCUS AUREUS: NEGATIVE

## 2013-11-01 MED ORDER — SODIUM CHLORIDE 0.9 % IV BOLUS (SEPSIS)
1000.0000 mL | INTRAVENOUS | Status: DC | PRN
  Administered 2013-11-01: 1000 mL via INTRAVENOUS

## 2013-11-01 MED ORDER — SODIUM CHLORIDE 0.9 % IV SOLN
INTRAVENOUS | Status: DC
  Administered 2013-11-01: 18:00:00 via INTRAVENOUS

## 2013-11-01 MED ORDER — PANTOPRAZOLE SODIUM 40 MG IV SOLR
40.0000 mg | INTRAVENOUS | Status: DC
  Administered 2013-11-01 – 2013-11-09 (×9): 40 mg via INTRAVENOUS
  Filled 2013-11-01 (×10): qty 40

## 2013-11-01 NOTE — Progress Notes (Signed)
Patient Demographics  Adrian Cross, is a 68 y.o. male, DOB - [birth year], MHD:[id number]  Admit date - 10/30/2013   Admitting Physician Bonnielee Haff, MD  Outpatient Primary MD for the patient is HILL,CHARLES, MD  LOS - 2   No chief complaint on file.       Subjective:   Adrian Cross today has, No headache, No chest pain, +ve LLQ abdominal pain - had Nausea and vomiting early morning 11/01/2013 is now better, No new weakness tingling or numbness, No Cough - SOB.   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 and on IVF + epiric antibiotics. Pain relief medication and gentle hydration. CCS following likely exploratory laparotomy today.    Diabetes mellitus  2 - since patient is n.p.o. I have placed patient on sliding-scale coverage for now + low dose Lantus. Closely follow CBGs.   CBG (last 3)   Recent Labs  10/31/13 2315 11/01/13 0359 11/01/13 0744  GLUCAP 213* 179* 186*      Hypertension - patient's blood pressure is in the low-normal. Hold antihypertensives. Continue hydration. When necessary IV hydralazine for systolic blood pressure more than 160.      Code Status: full  Family Communication: none  Disposition Plan: home   Procedures Ct abd-pelvis,    Consults CCS   Medications  Scheduled Meds: . ciprofloxacin  400 mg Intravenous Q12H  . heparin subcutaneous  5,000 Units Subcutaneous 3 times per day  . insulin aspart  0-9 Units Subcutaneous Q4H  . insulin glargine  8 Units Subcutaneous Daily  . metronidazole  500 mg Intravenous Q8H  . pantoprazole (PROTONIX) IV  40 mg Intravenous Q24H   Continuous Infusions: . sodium chloride 100 mL/hr at 11/01/13 0307   PRN Meds:.acetaminophen,  acetaminophen, hydrALAZINE, morphine injection, ondansetron (ZOFRAN) IV  DVT Prophylaxis   Heparin - SCDs    Lab Results  Component Value Date   PLT 301 11/01/2013    Antibiotics     Anti-infectives   Start     Dose/Rate Route Frequency Ordered Stop   10/31/13 1000  ciprofloxacin (CIPRO) IVPB 400 mg     400 mg 200 mL/hr over 60 Minutes Intravenous Every 12 hours 10/31/13 0022     10/31/13 0000  metroNIDAZOLE (FLAGYL) IVPB 500 mg     500 mg 100 mL/hr over 60 Minutes Intravenous Every 8 hours 10/30/13 2252     10/31/13 0000  ciprofloxacin (CIPRO) IVPB 400 mg     400 mg 200 mL/hr over 60 Minutes Intravenous NOW 10/30/13 2255 10/31/13 0111          Objective:   Filed Vitals:   10/31/13 0423 10/31/13 1430 10/31/13 2038 11/01/13 0405  BP: 103/57 95/66 99/64  103/64  Pulse: 89 99 96 70  Temp: 99.2 F (37.3 C) 98.3 F (36.8 C) 100.3 F (37.9 C) 98.1 F (36.7 C)  TempSrc: Oral Oral Oral Oral  Resp: 20 20 18 16   Height:      Weight:      SpO2: 98% 97% 96% 99%    Wt Readings from Last 3 Encounters:  10/30/13 70.6 kg (155 lb 10.3 oz)  Intake/Output Summary (Last 24 hours) at 11/01/13 0912 Last data filed at 11/01/13 6237  Gross per 24 hour  Intake   2280 ml  Output      0 ml  Net   2280 ml     Physical Exam  Awake Alert, Oriented X 3, No new F.N deficits, Normal affect [place].AT,PERRAL Supple Neck,No JVD, No cervical lymphadenopathy appriciated.  Symmetrical Chest wall movement, Good air movement bilaterally, CTAB RRR,No Gallops,Rubs or new Murmurs, No Parasternal Heave +ve B.Sounds, Abd Soft, +ve LLQ tenderness, No organomegaly appriciated, No rebound - guarding or rigidity. No Cyanosis, Clubbing or edema, No new Rash or bruise      Data Review   Micro Results Recent Results (from the past 240 hour(s))  SURGICAL PCR SCREEN     Status: None   Collection Time    11/01/13 12:12 AM      Result Value Ref Range Status   MRSA, PCR NEGATIVE  NEGATIVE Final    Staphylococcus aureus NEGATIVE  NEGATIVE Final   Comment:            The Xpert SA Assay (FDA     approved for NASAL specimens     in patients over 4 years of age),     is one component of     a comprehensive surveillance     program.  Test performance has     been validated by [employer] for patients greater     than or equal to 85 year old.     It is not intended     to diagnose infection nor to     guide or monitor treatment.    Radiology Reports No results found.  CBC  Recent Labs Lab 10/30/13 2348 10/31/13 0508 11/01/13 0529  WBC 11.1* 10.1 12.7*  HGB 13.7 13.9 14.2  HCT 40.3 40.9 42.4  PLT 274 291 301  MCV 92.0 92.3 93.4  MCH 31.3 31.4 31.3  MCHC 34.0 34.0 33.5  RDW 13.0 13.1 13.2  LYMPHSABS 2.2  --   --   MONOABS 0.6  --   --   EOSABS 0.0  --   --   BASOSABS 0.0  --   --     Chemistries   Recent Labs Lab 10/30/13 2348 10/31/13 0508 11/01/13 0529  NA 138 138 137  K 4.2 4.1 4.5  CL 100 98 100  CO2 25 24 21   GLUCOSE 260* 266* 169*  BUN 10 11 13   CREATININE 0.95 0.95 1.05  CALCIUM 9.5 9.5 10.3  AST 10  --  9  ALT 15  --  10  ALKPHOS 75  --  84  BILITOT 0.3  --  0.3   ------------------------------------------------------------------------------------------------------------------ estimated creatinine clearance is 62.4 ml/min (by C-G formula based on Cr of 1.05). ------------------------------------------------------------------------------------------------------------------ No results found for this basename: HGBA1C,  in the last 72 hours ------------------------------------------------------------------------------------------------------------------ No results found for this basename: CHOL, HDL, LDLCALC, TRIG, CHOLHDL, LDLDIRECT,  in the last 72 hours ------------------------------------------------------------------------------------------------------------------ No results found for this basename: TSH, T4TOTAL, FREET3, T3FREE, THYROIDAB,   in the last 72 hours ------------------------------------------------------------------------------------------------------------------ No results found for this basename: VITAMINB12, FOLATE, FERRITIN, TIBC, IRON, RETICCTPCT,  in the last 72 hours  Coagulation profile No results found for this basename: INR, PROTIME,  in the last 168 hours  No results found for this basename: DDIMER,  in the last 72 hours  Cardiac Enzymes No results found for this basename: CK, CKMB, TROPONINI, MYOGLOBIN,  in the last 168 hours ------------------------------------------------------------------------------------------------------------------ No components found with this basename: POCBNP,      Time Spent in minutes   35   Lala Lund K M.D on 11/01/2013 at 9:12 AM  Between 7am to 7pm - Pager - [phone]  After 7pm go to [email or web address] - password [email or web address]  And look for the night coverage person covering for me after hours  [facility] Office  [phone]   **Disclaimer: This note may have been dictated with voice recognition software. Similar sounding words can inadvertently be transcribed and this note may contain transcription errors which may not have been corrected upon publication of note.**

## 2013-11-01 NOTE — Progress Notes (Signed)
General Surgery Note  LOS: 2 days  POD -     Assessment/Plan: 1.  Omental mass - 6.4 x 5.1 cm  CT at [facility] (CT scan not in Epic at this time)  On Cipro/Flagyl  The OR schedule is busy today.  Will plan surgery tomorrow or more likely Monday.  Will give clear liquids until midnight. 2.  HTN 3.  DM 4.  GERD 5.  DVT prophylaxis - SQ Heparin 6.  Smokes 1 ppd  Principal Problem:   Abdominal abscess Active Problems:   HTN (hypertension)   Diabetes mellitus   Abdominal mass  Subjective:  A lot of family in room - 2 sisters, 1 daughter.  His wife is at home.  He feels a little better, but he has had abdominal pain going on about 1 to 2 months. Objective:   Filed Vitals:   11/01/13 0405  BP: 103/64  Pulse: 70  Temp: 98.1 F (36.7 C)  Resp: 16     Intake/Output from previous day:  08/14 0701 - 08/15 0700 In: 2280 [I.V.:1780; IV Piggyback:500] Out: -   Intake/Output this shift:      Physical Exam:   General: Older AA M who is alert and oriented.    HEENT: Normal. Pupils equal. .   Lungs: Clear   Abdomen: Tender - more towards LLQ.     Lab Results:    Recent Labs  10/31/13 0508 11/01/13 0529  WBC 10.1 12.7*  HGB 13.9 14.2  HCT 40.9 42.4  PLT 291 301    BMET   Recent Labs  10/31/13 0508 11/01/13 0529  NA 138 137  K 4.1 4.5  CL 98 100  CO2 24 21  GLUCOSE 266* 169*  BUN 11 13  CREATININE 0.95 1.05  CALCIUM 9.5 10.3    PT/INR  No results found for this basename: LABPROT, INR,  in the last 72 hours  ABG  No results found for this basename: PHART, PCO2, PO2, HCO3,  in the last 72 hours   Studies/Results:  No results found.   Anti-infectives:   Anti-infectives   Start     Dose/Rate Route Frequency Ordered Stop   10/31/13 1000  ciprofloxacin (CIPRO) IVPB 400 mg     400 mg 200 mL/hr over 60 Minutes Intravenous Every 12 hours 10/31/13 0022     10/31/13 0000  metroNIDAZOLE (FLAGYL) IVPB 500 mg     500 mg 100 mL/hr over 60 Minutes Intravenous  Every 8 hours 10/30/13 2252     10/31/13 0000  ciprofloxacin (CIPRO) IVPB 400 mg     400 mg 200 mL/hr over 60 Minutes Intravenous NOW 10/30/13 2255 10/31/13 0111      Alphonsa Overall, MD, FACS Pager: [facility] Office: [phone] 11/01/2013

## 2013-11-02 LAB — CBC
HEMATOCRIT: 40 % (ref 39.0–52.0)
HEMOGLOBIN: 13.5 g/dL (ref 13.0–17.0)
MCH: 31 pg (ref 26.0–34.0)
MCHC: 33.8 g/dL (ref 30.0–36.0)
MCV: 91.7 fL (ref 78.0–100.0)
Platelets: 294 10*3/uL (ref 150–400)
RBC: 4.36 MIL/uL (ref 4.22–5.81)
RDW: 13.1 % (ref 11.5–15.5)
WBC: 13.2 10*3/uL — ABNORMAL HIGH (ref 4.0–10.5)

## 2013-11-02 LAB — COMPREHENSIVE METABOLIC PANEL
ALT: 8 U/L (ref 0–53)
ANION GAP: 13 (ref 5–15)
AST: 7 U/L (ref 0–37)
Albumin: 1.9 g/dL — ABNORMAL LOW (ref 3.5–5.2)
Alkaline Phosphatase: 104 U/L (ref 39–117)
BUN: 12 mg/dL (ref 6–23)
CO2: 21 meq/L (ref 19–32)
CREATININE: 0.85 mg/dL (ref 0.50–1.35)
Calcium: 9.5 mg/dL (ref 8.4–10.5)
Chloride: 104 mEq/L (ref 96–112)
GFR calc Af Amer: >90 mL/min (ref >90)
GFR, EST NON AFRICAN AMERICAN: 89 mL/min — AB (ref >90)
GLUCOSE: 143 mg/dL — AB (ref 70–99)
Potassium: 3.5 mEq/L — ABNORMAL LOW (ref 3.7–5.3)
SODIUM: 138 meq/L (ref 137–147)
TOTAL PROTEIN: 5.9 g/dL — AB (ref 6.0–8.3)
Total Bilirubin: 0.3 mg/dL (ref 0.3–1.2)

## 2013-11-02 LAB — GLUCOSE, CAPILLARY
GLUCOSE-CAPILLARY: 140 mg/dL — AB (ref 70–99)
GLUCOSE-CAPILLARY: 137 mg/dL — AB (ref 70–99)
Glucose-Capillary: 135 mg/dL — ABNORMAL HIGH (ref 70–99)
Glucose-Capillary: 148 mg/dL — ABNORMAL HIGH (ref 70–99)
Glucose-Capillary: 128 mg/dL — ABNORMAL HIGH (ref 70–99)

## 2013-11-02 MED ORDER — INSULIN GLARGINE 100 UNIT/ML ~~LOC~~ SOLN
5.0000 [IU] | Freq: Every day | SUBCUTANEOUS | Status: DC
  Administered 2013-11-03: 5 [IU] via SUBCUTANEOUS
  Filled 2013-11-02 (×4): qty 0.05

## 2013-11-02 MED ORDER — POTASSIUM CHLORIDE IN NACL 40-0.9 MEQ/L-% IV SOLN
INTRAVENOUS | Status: DC
  Administered 2013-11-02 – 2013-11-03 (×2): 75 mL/h via INTRAVENOUS
  Filled 2013-11-02 (×3): qty 1000

## 2013-11-02 NOTE — Progress Notes (Signed)
Patient Demographics  Adrian Cross, is a 68 y.o. male, DOB - 1946-02-23, QPR:[id number]  Admit date - 10/30/2013   Admitting Physician Rise Patience, MD  Outpatient Primary MD for the patient is HILL,CHARLES, MD  LOS - 3   No chief complaint on file.       Subjective:   Adrian Cross today has, No headache, No chest pain, +ve LLQ abdominal pain - has Nausea , No new weakness tingling or numbness, No Cough - SOB.   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 and on IVF + epiric antibiotics. Pain relief medication and gentle hydration. CCS following likely exploratory laparotomy per Surgery.    Diabetes mellitus  2 - since patient is n.p.o. I have placed patient on sliding-scale coverage for now + low dose Lantus. Closely follow CBGs.   CBG (last 3)   Recent Labs  11/01/13 2340 11/02/13 0418 11/02/13 0731  GLUCAP 148* 135* 140*      Hypertension - patient's blood pressure is in the low-normal. Hold antihypertensives. Continue hydration. When necessary IV hydralazine for systolic blood pressure more than 160.      Code Status: full  Family Communication: none  Disposition Plan: home   Procedures Ct abd-pelvis,    Consults CCS   Medications  Scheduled Meds: . ciprofloxacin  400 mg Intravenous Q12H  . heparin subcutaneous  5,000 Units Subcutaneous 3 times per day  . insulin aspart  0-9 Units Subcutaneous Q4H  . insulin glargine  8 Units Subcutaneous Daily  . metronidazole  500 mg Intravenous Q8H  . pantoprazole (PROTONIX) IV  40 mg Intravenous Q24H   Continuous Infusions: . sodium chloride 75 mL/hr at 11/01/13 1742   PRN Meds:.acetaminophen, acetaminophen, hydrALAZINE, morphine  injection, ondansetron (ZOFRAN) IV, sodium chloride  DVT Prophylaxis   Heparin - SCDs    Lab Results  Component Value Date   PLT 294 11/02/2013    Antibiotics     Anti-infectives   Start     Dose/Rate Route Frequency Ordered Stop   10/31/13 1000  ciprofloxacin (CIPRO) IVPB 400 mg     400 mg 200 mL/hr over 60 Minutes Intravenous Every 12 hours 10/31/13 0022     10/31/13 0000  metroNIDAZOLE (FLAGYL) IVPB 500 mg     500 mg 100 mL/hr over 60 Minutes Intravenous Every 8 hours 10/30/13 2252     10/31/13 0000  ciprofloxacin (CIPRO) IVPB 400 mg     400 mg 200 mL/hr over 60 Minutes Intravenous NOW 10/30/13 2255 10/31/13 0111          Objective:   Filed Vitals:   11/01/13 0405 11/01/13 1421 11/01/13 2014 11/02/13 0419  BP: 103/64 109/75 126/81 115/73  Pulse: 70 84 89 86  Temp: 98.1 F (36.7 C) 98.9 F (37.2 C) 99 F (37.2 C) 99.5 F (37.5 C)  TempSrc: Oral Oral Oral Oral  Resp: 16 16 16 16   Height:      Weight:      SpO2: 99% 97% 96% 93%    Wt Readings from Last 3 Encounters:  10/30/13 70.6 kg (155 lb 10.3 oz)     Intake/Output Summary (Last  24 hours) at 11/02/13 1120 Last data filed at 11/02/13 0636  Gross per 24 hour  Intake  967.5 ml  Output      1 ml  Net  966.5 ml     Physical Exam  Awake Alert, Oriented X 3, No new F.N deficits, Normal affect Adrian Cross.AT,PERRAL Supple Neck,No JVD, No cervical lymphadenopathy appriciated.  Symmetrical Chest wall movement, Good air movement bilaterally, CTAB RRR,No Gallops,Rubs or new Murmurs, No Parasternal Heave +ve B.Sounds, Abd Soft, +ve LLQ tenderness, No organomegaly appriciated, No rebound - guarding or rigidity. No Cyanosis, Clubbing or edema, No new Rash or bruise      Data Review   Micro Results Recent Results (from the past 240 hour(s))  SURGICAL PCR SCREEN     Status: None   Collection Time    11/01/13 12:12 AM      Result Value Ref Range Status   MRSA, PCR NEGATIVE  NEGATIVE Final   Staphylococcus aureus  NEGATIVE  NEGATIVE Final   Comment:            The Xpert SA Assay (FDA     approved for NASAL specimens     in patients over 69 years of age),     is one component of     a comprehensive surveillance     program.  Test performance has     been validated by [employer] for patients greater     than or equal to 8 year old.     It is not intended     to diagnose infection nor to     guide or monitor treatment.    Radiology Reports No results found.  CBC  Recent Labs Lab 10/30/13 2348 10/31/13 0508 11/01/13 0529 11/02/13 0615  WBC 11.1* 10.1 12.7* 13.2*  HGB 13.7 13.9 14.2 13.5  HCT 40.3 40.9 42.4 40.0  PLT 274 291 301 294  MCV 92.0 92.3 93.4 91.7  MCH 31.3 31.4 31.3 31.0  MCHC 34.0 34.0 33.5 33.8  RDW 13.0 13.1 13.2 13.1  LYMPHSABS 2.2  --   --   --   MONOABS 0.6  --   --   --   EOSABS 0.0  --   --   --   BASOSABS 0.0  --   --   --     Chemistries   Recent Labs Lab 10/30/13 2348 10/31/13 0508 11/01/13 0529 11/02/13 0615  NA 138 138 137 138  K 4.2 4.1 4.5 3.5*  CL 100 98 100 104  CO2 25 24 21 21   GLUCOSE 260* 266* 169* 143*  BUN 10 11 13 12   CREATININE 0.95 0.95 1.05 0.85  CALCIUM 9.5 9.5 10.3 9.5  AST 10  --  9 7  ALT 15  --  10 8  ALKPHOS 75  --  84 104  BILITOT 0.3  --  0.3 0.3   ------------------------------------------------------------------------------------------------------------------ estimated creatinine clearance is 77.1 ml/min (by C-G formula based on Cr of 0.85). ------------------------------------------------------------------------------------------------------------------ No results found for this basename: HGBA1C,  in the last 72 hours ------------------------------------------------------------------------------------------------------------------ No results found for this basename: CHOL, HDL, LDLCALC, TRIG, CHOLHDL, LDLDIRECT,  in the last 72  hours ------------------------------------------------------------------------------------------------------------------ No results found for this basename: TSH, T4TOTAL, FREET3, T3FREE, THYROIDAB,  in the last 72 hours ------------------------------------------------------------------------------------------------------------------ No results found for this basename: VITAMINB12, FOLATE, FERRITIN, TIBC, IRON, RETICCTPCT,  in the last 72 hours  Coagulation profile No results found for this basename: INR, PROTIME,  in  the last 168 hours  No results found for this basename: DDIMER,  in the last 72 hours  Cardiac Enzymes No results found for this basename: CK, CKMB, TROPONINI, MYOGLOBIN,  in the last 168 hours ------------------------------------------------------------------------------------------------------------------ No components found with this basename: POCBNP,      Time Spent in minutes   35   Keisa Blow K M.D on 11/02/2013 at 11:20 AM  Between 7am to 7pm - Pager - [phone]  After 7pm go to [email or web address] - password [email or web address]  And look for the night coverage person covering for me after hours  [facility] Office  [phone]   **Disclaimer: This note may have been dictated with voice recognition software. Similar sounding words can inadvertently be transcribed and this note may contain transcription errors which may not have been corrected upon publication of note.**

## 2013-11-02 NOTE — Progress Notes (Signed)
General Surgery Note  LOS: 3 days  POD -     Assessment/Plan: 1.  Omental mass - 6.4 x 5.1 cm  CT at [facility] (CT scan now can be accessed in Epic)  On Cipro/Flagyl  Plan probable surgery tomorrow AM for exploration and removal of mass.  I discussed possible surgery with patient.  The risks include, but are not limited to, bleeding, infection, bowel resection, and ostomy.  Dr. Georgette Dover is familiar with the patient.  2.  HTN 3.  DM 4.  GERD 5.  DVT prophylaxis - SQ Heparin 6.  Smokes 1 ppd   Principal Problem:   Abdominal abscess Active Problems:   HTN (hypertension)   Diabetes mellitus   Abdominal mass  Subjective:  Vomited yesterday several times.  No family in room today.  Looks better today. Objective:   Filed Vitals:   11/02/13 0419  BP: 115/73  Pulse: 86  Temp: 99.5 F (37.5 C)  Resp: 16     Intake/Output from previous day:  08/15 0701 - 08/16 0700 In: 967.5 [I.V.:967.5] Out: 1 [Emesis/NG output:1]  Intake/Output this shift:      Physical Exam:   General: Older AA M who is alert and oriented.    HEENT: Normal. Pupils equal. .   Lungs: Clear   Abdomen: Tender - more towards LLQ.     Lab Results:     Recent Labs  11/01/13 0529 11/02/13 0615  WBC 12.7* 13.2*  HGB 14.2 13.5  HCT 42.4 40.0  PLT 301 294    BMET    Recent Labs  11/01/13 0529 11/02/13 0615  NA 137 138  K 4.5 3.5*  CL 100 104  CO2 21 21  GLUCOSE 169* 143*  BUN 13 12  CREATININE 1.05 0.85  CALCIUM 10.3 9.5    PT/INR  No results found for this basename: LABPROT, INR,  in the last 72 hours  ABG  No results found for this basename: PHART, PCO2, PO2, HCO3,  in the last 72 hours   Studies/Results:  Dg Chest 2 View  11/01/2013   CLINICAL DATA:  Preop for abdominal mass  EXAM: CHEST  2 VIEW  COMPARISON:  CT 10/30/2013  FINDINGS: Normal cardiac silhouette. There is linear markings at the lung bases corresponds linear atelectasis on comparison CT. Upper lungs are clear. No pleural  fluid.  IMPRESSION: 1. No interval change from recent CT. 2. Bibasilar atelectasis.   Electronically Signed   By: Suzy Bouchard M.D.   On: 11/01/2013 15:58     Anti-infectives:   Anti-infectives   Start     Dose/Rate Route Frequency Ordered Stop   10/31/13 1000  ciprofloxacin (CIPRO) IVPB 400 mg     400 mg 200 mL/hr over 60 Minutes Intravenous Every 12 hours 10/31/13 0022     10/31/13 0000  metroNIDAZOLE (FLAGYL) IVPB 500 mg     500 mg 100 mL/hr over 60 Minutes Intravenous Every 8 hours 10/30/13 2252     10/31/13 0000  ciprofloxacin (CIPRO) IVPB 400 mg     400 mg 200 mL/hr over 60 Minutes Intravenous NOW 10/30/13 2255 10/31/13 0111      Alphonsa Overall, MD, FACS Pager: [facility] Office: [phone] 11/02/2013

## 2013-11-02 NOTE — Progress Notes (Addendum)
PT with at least 3  episodes of vomiting overnight. Paged provider on call with no response. Pt no longer vomiting or requesting anything for vomiting.

## 2013-11-03 ENCOUNTER — Inpatient Hospital Stay (HOSPITAL_COMMUNITY): Payer: 59 | Admitting: Anesthesiology

## 2013-11-03 ENCOUNTER — Encounter (HOSPITAL_COMMUNITY): Payer: 59 | Admitting: Anesthesiology

## 2013-11-03 ENCOUNTER — Encounter (HOSPITAL_COMMUNITY)
Admission: AD | Disposition: A | Discharge status: Home / Self Care | Payer: Self-pay | Source: Other Acute Inpatient Hospital | Attending: Internal Medicine

## 2013-11-03 ENCOUNTER — Encounter (HOSPITAL_COMMUNITY): Payer: Self-pay | Admitting: Anesthesiology

## 2013-11-03 DIAGNOSIS — K651 Peritoneal abscess: Secondary | ICD-10-CM

## 2013-11-03 DIAGNOSIS — K660 Peritoneal adhesions (postprocedural) (postinfection): Secondary | ICD-10-CM

## 2013-11-03 HISTORY — PX: LAPAROTOMY: SHX154

## 2013-11-03 LAB — CBC
HCT: 38.6 % — ABNORMAL LOW (ref 39.0–52.0)
HEMATOCRIT: 37 % — AB (ref 39.0–52.0)
HEMOGLOBIN: 12.6 g/dL — AB (ref 13.0–17.0)
Hemoglobin: 13.3 g/dL (ref 13.0–17.0)
MCH: 30.4 pg (ref 26.0–34.0)
MCH: 30.6 pg (ref 26.0–34.0)
MCHC: 34.1 g/dL (ref 30.0–36.0)
MCHC: 34.5 g/dL (ref 30.0–36.0)
MCV: 89.4 fL (ref 78.0–100.0)
MCV: 88.7 fL (ref 78.0–100.0)
Platelets: 311 10*3/uL (ref 150–400)
Platelets: 314 K/uL (ref 150–400)
RBC: 4.14 MIL/uL — AB (ref 4.22–5.81)
RBC: 4.35 MIL/uL (ref 4.22–5.81)
RDW: 13 % (ref 11.5–15.5)
RDW: 13 % (ref 11.5–15.5)
WBC: 10.9 10*3/uL — ABNORMAL HIGH (ref 4.0–10.5)
WBC: 13.8 K/uL — ABNORMAL HIGH (ref 4.0–10.5)

## 2013-11-03 LAB — COMPREHENSIVE METABOLIC PANEL
ALK PHOS: 79 U/L (ref 39–117)
ALT: 8 U/L (ref 0–53)
ANION GAP: 11 (ref 5–15)
AST: 11 U/L (ref 0–37)
Albumin: 1.9 g/dL — ABNORMAL LOW (ref 3.5–5.2)
BUN: 11 mg/dL (ref 6–23)
CALCIUM: 9.2 mg/dL (ref 8.4–10.5)
CO2: 23 meq/L (ref 19–32)
Chloride: 103 mEq/L (ref 96–112)
Creatinine, Ser: 0.9 mg/dL (ref 0.50–1.35)
GFR calc non Af Amer: 87 mL/min — ABNORMAL LOW (ref >90)
GLUCOSE: 135 mg/dL — AB (ref 70–99)
Potassium: 3.6 mEq/L — ABNORMAL LOW (ref 3.7–5.3)
Sodium: 137 mEq/L (ref 137–147)
Total Bilirubin: 0.3 mg/dL (ref 0.3–1.2)
Total Protein: 5.7 g/dL — ABNORMAL LOW (ref 6.0–8.3)

## 2013-11-03 LAB — GLUCOSE, CAPILLARY
GLUCOSE-CAPILLARY: 151 mg/dL — AB (ref 70–99)
Glucose-Capillary: 115 mg/dL — ABNORMAL HIGH (ref 70–99)
Glucose-Capillary: 116 mg/dL — ABNORMAL HIGH (ref 70–99)
Glucose-Capillary: 151 mg/dL — ABNORMAL HIGH (ref 70–99)
Glucose-Capillary: 175 mg/dL — ABNORMAL HIGH (ref 70–99)
Glucose-Capillary: 185 mg/dL — ABNORMAL HIGH (ref 70–99)
Glucose-Capillary: 199 mg/dL — ABNORMAL HIGH (ref 70–99)
Glucose-Capillary: 231 mg/dL — ABNORMAL HIGH (ref 70–99)

## 2013-11-03 LAB — MAGNESIUM: Magnesium: 1.9 mg/dL (ref 1.5–2.5)

## 2013-11-03 LAB — CREATININE, SERUM
Creatinine, Ser: 0.83 mg/dL (ref 0.50–1.35)
GFR calc Af Amer: >90 mL/min
GFR calc non Af Amer: 90 mL/min — ABNORMAL LOW

## 2013-11-03 SURGERY — LAPAROTOMY, EXPLORATORY
Anesthesia: General | Site: Abdomen

## 2013-11-03 MED ORDER — ONDANSETRON HCL 4 MG/2ML IJ SOLN: 4.0000 mg | Freq: Four times a day (QID) | INTRAMUSCULAR | Status: DC | PRN

## 2013-11-03 MED ORDER — HYDROMORPHONE HCL PF 1 MG/ML IJ SOLN
0.2500 mg | INTRAMUSCULAR | Status: DC | PRN
  Administered 2013-11-03 (×4): 0.5 mg via INTRAVENOUS

## 2013-11-03 MED ORDER — CHLORHEXIDINE GLUCONATE 0.12 % MT SOLN: 15.0000 mL | Freq: Two times a day (BID) | OROMUCOSAL | Status: DC

## 2013-11-03 MED ORDER — NEOSTIGMINE METHYLSULFATE 10 MG/10ML IV SOLN
INTRAVENOUS | Status: DC | PRN
  Administered 2013-11-03: 1 mg via INTRAVENOUS
  Administered 2013-11-03: 3 mg via INTRAVENOUS

## 2013-11-03 MED ORDER — ONDANSETRON HCL 4 MG/2ML IJ SOLN
INTRAMUSCULAR | Status: AC
  Filled 2013-11-03: qty 2

## 2013-11-03 MED ORDER — DIPHENHYDRAMINE HCL 50 MG/ML IJ SOLN: 12.5000 mg | Freq: Four times a day (QID) | INTRAMUSCULAR | Status: DC | PRN

## 2013-11-03 MED ORDER — HYDROMORPHONE HCL PF 1 MG/ML IJ SOLN
INTRAMUSCULAR | Status: AC
  Administered 2013-11-03: 0.5 mg via INTRAVENOUS
  Filled 2013-11-03: qty 1

## 2013-11-03 MED ORDER — MORPHINE SULFATE (PF) 1 MG/ML IV SOLN
INTRAVENOUS | Status: AC
  Filled 2013-11-03: qty 25

## 2013-11-03 MED ORDER — ONDANSETRON HCL 4 MG/2ML IJ SOLN: 4.0000 mg | Freq: Once | INTRAMUSCULAR | Status: DC | PRN

## 2013-11-03 MED ORDER — NALOXONE HCL 0.4 MG/ML IJ SOLN: 0.4000 mg | INTRAMUSCULAR | Status: DC | PRN

## 2013-11-03 MED ORDER — CETYLPYRIDINIUM CHLORIDE 0.05 % MT LIQD: 7.0000 mL | Freq: Two times a day (BID) | OROMUCOSAL | Status: DC

## 2013-11-03 MED ORDER — 0.9 % SODIUM CHLORIDE (POUR BTL) OPTIME
TOPICAL | Status: DC | PRN
  Administered 2013-11-03: 4000 mL

## 2013-11-03 MED ORDER — FENTANYL CITRATE 0.05 MG/ML IJ SOLN
INTRAMUSCULAR | Status: AC
  Filled 2013-11-03: qty 5

## 2013-11-03 MED ORDER — ONDANSETRON HCL 4 MG/2ML IJ SOLN
INTRAMUSCULAR | Status: DC | PRN
  Administered 2013-11-03: 4 mg via INTRAVENOUS

## 2013-11-03 MED ORDER — OXYCODONE HCL 5 MG PO TABS
5.0000 mg | ORAL_TABLET | Freq: Once | ORAL | Status: DC | PRN
Start: 2013-11-03 — End: 2013-11-03

## 2013-11-03 MED ORDER — DEXTROSE 5 % IV SOLN
INTRAVENOUS | Status: DC | PRN
  Administered 2013-11-03: 11:00:00 via INTRAVENOUS

## 2013-11-03 MED ORDER — POTASSIUM CHLORIDE 10 MEQ/100ML IV SOLN
10.0000 meq | INTRAVENOUS | Status: AC
  Administered 2013-11-03: 10 meq via INTRAVENOUS
  Filled 2013-11-03 (×2): qty 100

## 2013-11-03 MED ORDER — LIDOCAINE HCL (CARDIAC) 20 MG/ML IV SOLN
INTRAVENOUS | Status: AC
  Filled 2013-11-03: qty 5

## 2013-11-03 MED ORDER — GLYCOPYRROLATE 0.2 MG/ML IJ SOLN
INTRAMUSCULAR | Status: AC
  Filled 2013-11-03: qty 2

## 2013-11-03 MED ORDER — VECURONIUM BROMIDE 10 MG IV SOLR
INTRAVENOUS | Status: DC | PRN
  Administered 2013-11-03: 4 mg via INTRAVENOUS

## 2013-11-03 MED ORDER — SODIUM CHLORIDE 0.9 % IJ SOLN: 9.0000 mL | INTRAMUSCULAR | Status: DC | PRN

## 2013-11-03 MED ORDER — HEPARIN SODIUM (PORCINE) 5000 UNIT/ML IJ SOLN
5000.0000 [IU] | Freq: Three times a day (TID) | INTRAMUSCULAR | Status: DC
  Administered 2013-11-04 – 2013-11-09 (×16): 5000 [IU] via SUBCUTANEOUS
  Filled 2013-11-03 (×20): qty 1

## 2013-11-03 MED ORDER — ROCURONIUM BROMIDE 50 MG/5ML IV SOLN
INTRAVENOUS | Status: AC
  Filled 2013-11-03: qty 1

## 2013-11-03 MED ORDER — MIDAZOLAM HCL 5 MG/5ML IJ SOLN
INTRAMUSCULAR | Status: DC | PRN
  Administered 2013-11-03: 1 mg via INTRAVENOUS

## 2013-11-03 MED ORDER — LACTATED RINGERS IV SOLN
INTRAVENOUS | Status: DC | PRN
  Administered 2013-11-03 (×2): via INTRAVENOUS

## 2013-11-03 MED ORDER — FENTANYL CITRATE 0.05 MG/ML IJ SOLN
INTRAMUSCULAR | Status: DC | PRN
  Administered 2013-11-03 (×2): 50 ug via INTRAVENOUS
  Administered 2013-11-03: 100 ug via INTRAVENOUS

## 2013-11-03 MED ORDER — NEOSTIGMINE METHYLSULFATE 10 MG/10ML IV SOLN
INTRAVENOUS | Status: AC
  Filled 2013-11-03: qty 1

## 2013-11-03 MED ORDER — PROPOFOL 10 MG/ML IV BOLUS
INTRAVENOUS | Status: DC | PRN
  Administered 2013-11-03: 200 mg via INTRAVENOUS

## 2013-11-03 MED ORDER — POTASSIUM CHLORIDE IN NACL 40-0.9 MEQ/L-% IV SOLN
INTRAVENOUS | Status: DC
  Administered 2013-11-03 – 2013-11-04 (×2): 100 mL/h via INTRAVENOUS
  Administered 2013-11-04: 75 mL/h via INTRAVENOUS
  Filled 2013-11-03 (×4): qty 1000

## 2013-11-03 MED ORDER — MIDAZOLAM HCL 2 MG/2ML IJ SOLN
INTRAMUSCULAR | Status: AC
  Filled 2013-11-03: qty 2

## 2013-11-03 MED ORDER — SUCCINYLCHOLINE CHLORIDE 20 MG/ML IJ SOLN
INTRAMUSCULAR | Status: DC | PRN
  Administered 2013-11-03: 120 mg via INTRAVENOUS

## 2013-11-03 MED ORDER — GLYCOPYRROLATE 0.2 MG/ML IJ SOLN
INTRAMUSCULAR | Status: DC | PRN
  Administered 2013-11-03 (×2): 0.4 mg via INTRAVENOUS

## 2013-11-03 MED ORDER — MORPHINE SULFATE (PF) 1 MG/ML IV SOLN
INTRAVENOUS | Status: DC
  Administered 2013-11-03: 1.5 mg via INTRAVENOUS
  Administered 2013-11-03: 3 mg via INTRAVENOUS
  Administered 2013-11-04: 1.5 mg via INTRAVENOUS

## 2013-11-03 MED ORDER — DIPHENHYDRAMINE HCL 12.5 MG/5ML PO ELIX: 12.5000 mg | ORAL_SOLUTION | Freq: Four times a day (QID) | ORAL | Status: DC | PRN

## 2013-11-03 MED ORDER — PHENYLEPHRINE HCL 10 MG/ML IJ SOLN
INTRAMUSCULAR | Status: DC | PRN
  Administered 2013-11-03 (×2): 40 ug via INTRAVENOUS

## 2013-11-03 MED ORDER — LIDOCAINE HCL (CARDIAC) 20 MG/ML IV SOLN
INTRAVENOUS | Status: DC | PRN
  Administered 2013-11-03: 100 mg via INTRAVENOUS

## 2013-11-03 MED ORDER — DIPHENHYDRAMINE HCL 12.5 MG/5ML PO ELIX
12.5000 mg | ORAL_SOLUTION | Freq: Four times a day (QID) | ORAL | Status: DC | PRN
  Filled 2013-11-03: qty 5

## 2013-11-03 MED ORDER — OXYCODONE HCL 5 MG/5ML PO SOLN: 5.0000 mg | Freq: Once | ORAL | Status: DC | PRN

## 2013-11-03 MED ORDER — PROPOFOL 10 MG/ML IV BOLUS
INTRAVENOUS | Status: AC
  Filled 2013-11-03: qty 20

## 2013-11-03 MED ADMIN — Morphine Sulfate IV Soln PF 1 MG/ML: INTRAVENOUS | @ 12:00:00

## 2013-11-03 SURGICAL SUPPLY — 47 items
BLADE SURG ROTATE 9660 (MISCELLANEOUS) ×2 IMPLANT
CANISTER SUCTION 2500CC (MISCELLANEOUS) ×3 IMPLANT
CHLORAPREP W/TINT 26ML (MISCELLANEOUS) ×3 IMPLANT
COVER MAYO STAND STRL (DRAPES) IMPLANT
COVER SURGICAL LIGHT HANDLE (MISCELLANEOUS) ×3 IMPLANT
DRAPE LAPAROSCOPIC ABDOMINAL (DRAPES) ×3 IMPLANT
DRAPE PROXIMA HALF (DRAPES) IMPLANT
DRAPE UTILITY 15X26 W/TAPE STR (DRAPE) ×6 IMPLANT
DRAPE WARM FLUID 44X44 (DRAPE) ×3 IMPLANT
DRSG COVADERM 4X14 (GAUZE/BANDAGES/DRESSINGS) ×2 IMPLANT
DRSG OPSITE POSTOP 4X10 (GAUZE/BANDAGES/DRESSINGS) IMPLANT
DRSG OPSITE POSTOP 4X8 (GAUZE/BANDAGES/DRESSINGS) IMPLANT
ELECT BLADE 6.5 EXT (BLADE) IMPLANT
ELECT CAUTERY BLADE 6.4 (BLADE) ×4 IMPLANT
ELECT REM PT RETURN 9FT ADLT (ELECTROSURGICAL) ×3
ELECTRODE REM PT RTRN 9FT ADLT (ELECTROSURGICAL) ×1 IMPLANT
GLOVE BIO SURGEON STRL SZ 6.5 (GLOVE) ×1 IMPLANT
GLOVE BIO SURGEON STRL SZ7 (GLOVE) ×9 IMPLANT
GLOVE BIO SURGEONS STRL SZ 6.5 (GLOVE) ×1
GLOVE BIOGEL PI IND STRL 7.0 (GLOVE) IMPLANT
GLOVE BIOGEL PI IND STRL 7.5 (GLOVE) ×1 IMPLANT
GLOVE BIOGEL PI INDICATOR 7.0 (GLOVE) ×4
GLOVE BIOGEL PI INDICATOR 7.5 (GLOVE) ×2
GOWN STRL REUS W/ TWL LRG LVL3 (GOWN DISPOSABLE) ×3 IMPLANT
GOWN STRL REUS W/TWL LRG LVL3 (GOWN DISPOSABLE) ×9
KIT BASIN OR (CUSTOM PROCEDURE TRAY) ×3 IMPLANT
KIT ROOM TURNOVER OR (KITS) ×3 IMPLANT
LIGASURE IMPACT 36 18CM CVD LR (INSTRUMENTS) ×2 IMPLANT
NS IRRIG 1000ML POUR BTL (IV SOLUTION) ×6 IMPLANT
PACK GENERAL/GYN (CUSTOM PROCEDURE TRAY) ×3 IMPLANT
PAD ARMBOARD 7.5X6 YLW CONV (MISCELLANEOUS) ×5 IMPLANT
PENCIL BUTTON HOLSTER BLD 10FT (ELECTRODE) IMPLANT
SPECIMEN JAR LARGE (MISCELLANEOUS) IMPLANT
SPONGE LAP 18X18 X RAY DECT (DISPOSABLE) IMPLANT
STAPLER VISISTAT 35W (STAPLE) ×3 IMPLANT
SUCTION POOLE TIP (SUCTIONS) ×3 IMPLANT
SUT PDS AB 1 TP1 96 (SUTURE) ×6 IMPLANT
SUT SILK 2 0 SH CR/8 (SUTURE) ×3 IMPLANT
SUT SILK 2 0 TIES 10X30 (SUTURE) ×3 IMPLANT
SUT SILK 3 0 SH CR/8 (SUTURE) ×3 IMPLANT
SUT SILK 3 0 TIES 10X30 (SUTURE) ×3 IMPLANT
SUT VIC AB 3-0 SH 18 (SUTURE) IMPLANT
TOWEL OR 17X26 10 PK STRL BLUE (TOWEL DISPOSABLE) ×3 IMPLANT
TRAY FOLEY CATH 16FRSI W/METER (SET/KITS/TRAYS/PACK) ×2 IMPLANT
TUBE CONNECTING 12'X1/4 (SUCTIONS)
TUBE CONNECTING 12X1/4 (SUCTIONS) IMPLANT
YANKAUER SUCT BULB TIP NO VENT (SUCTIONS) IMPLANT

## 2013-11-03 NOTE — Progress Notes (Signed)
CARE MANAGEMENT NOTE 11/03/2013  Patient:  Adrian Cross, Adrian Cross   Account Number:  [id number]  Date Initiated:  10/31/2013  Documentation initiated by:  Tomi Bamberger  Subjective/Objective Assessment:   dx tumor abd, n/v  admit- lives with family.     Action/Plan:   Anticipated DC Date:  11/02/2013   Anticipated DC Plan:  [place]  CM consult      Choice offered to / List presented to:             Status of service:   Medicare Important Message given?  YES (If response is "NO", the following Medicare IM given date fields will be blank) Date Medicare IM given:  11/03/2013 Medicare IM given by:  [facility] Date Additional Medicare IM given:   Additional Medicare IM given by:    Discharge Disposition:    Per UR Regulation:  Reviewed for med. necessity/level of care/duration of stay  If discussed at [place] of Stay Meetings, dates discussed:    Comments:

## 2013-11-03 NOTE — Anesthesia Preprocedure Evaluation (Addendum)
Anesthesia Evaluation  Patient identified by MRN, date of birth, ID band Patient awake    Reviewed: Allergy & Precautions, H&P , NPO status , Patient's Chart, lab work & pertinent test results  Airway Mallampati: I TM Distance: >3 FB Neck ROM: Full    Dental  (+) Edentulous Upper, Edentulous Lower, Dental Advisory Given   Pulmonary Current Smoker,  breath sounds clear to auscultation        Cardiovascular hypertension, Pt. on medications Rhythm:Regular Rate:Normal     Neuro/Psych    GI/Hepatic GERD-  Medicated and Controlled,  Endo/Other  diabetes, Well Controlled, Type 2, Insulin Dependent  Renal/GU      Musculoskeletal   Abdominal   Peds  Hematology   Anesthesia Other Findings   Reproductive/Obstetrics                         Anesthesia Physical Anesthesia Plan  ASA: III  Anesthesia Plan: General   Post-op Pain Management:    Induction: Intravenous  Airway Management Planned: Oral ETT  Additional Equipment:   Intra-op Plan:   Post-operative Plan: Extubation in OR  Informed Consent: I have reviewed the patients History and Physical, chart, labs and discussed the procedure including the risks, benefits and alternatives for the proposed anesthesia with the patient or authorized representative who has indicated his/her understanding and acceptance.   Dental advisory given  Plan Discussed with: CRNA, Anesthesiologist and Surgeon  Anesthesia Plan Comments:         Anesthesia Quick Evaluation

## 2013-11-03 NOTE — Progress Notes (Signed)
Patient ID: Adrian Cross, male   DOB: September 03, 1945, 68 y.o.   MRN: [id number] Filed Vitals:   11/03/13 0411  BP: 122/72  Pulse: 74  Temp: 98 F (36.7 C)  Resp: 20    Patient still having some LLQ pain. Will proceed with exploratory laparotomy and resection of omental mass today.  The surgical procedure has been discussed with the patient.  Potential risks, benefits, alternative treatments, and expected outcomes have been explained.  All of the patient's questions at this time have been answered.  The likelihood of reaching the patient's treatment goal is good.  The patient understand the proposed surgical procedure and wishes to proceed.  Imogene Burn. Georgette Dover, MD, [facility] Surgery  General/ Trauma Surgery  11/03/2013 8:51 AM

## 2013-11-03 NOTE — Progress Notes (Signed)
Patient Demographics  Adrian Cross, is a 68 y.o. male, DOB - [birth year], HEN:[id number]  Admit date - 10/30/2013   Admitting Physician Rise Patience, MD  Outpatient Primary MD for the patient is HILL,CHARLES, MD  LOS - 4   No chief complaint on file.       Subjective:   Adrian Cross today has, No headache, No chest pain, +ve LLQ abdominal pain - has Nausea , No new weakness tingling or numbness, No Cough - SOB.   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 and on IVF + epiric antibiotics. Pain relief medication and gentle hydration. CCS following likely exploratory laparotomy 11-03-13.    Diabetes mellitus  2 - since patient is n.p.o. I have placed patient on sliding-scale coverage for now + low dose Lantus. Closely follow CBGs.   CBG (last 3)   Recent Labs  11/03/13 0050 11/03/13 0401 11/03/13 0756  GLUCAP 116* 115* 151*      Hypertension - patient's blood pressure is in the low-normal. Hold antihypertensives. Continue hydration. When necessary IV hydralazine for systolic blood pressure more than 160.    Low K - replaced      Code Status: full  Family Communication: none  Disposition Plan: home   Procedures Ct abd-pelvis,    Consults CCS   Medications  Scheduled Meds: . [facility] HOLD] ciprofloxacin  400 mg Intravenous Q12H  . [facility] HOLD] heparin subcutaneous  5,000 Units Subcutaneous 3 times per day  . [MAR HOLD] insulin aspart  0-9 Units Subcutaneous Q4H  . [MAR HOLD] insulin glargine  5 Units Subcutaneous Daily  . [facility] HOLD] metronidazole  500 mg Intravenous Q8H  . [MAR HOLD] pantoprazole (PROTONIX) IV  40 mg Intravenous Q24H  . [facility] HOLD] potassium chloride  10 mEq Intravenous Q1  Hr x 2   Continuous Infusions: . 0.9 % NaCl with KCl 40 mEq / L 75 mL/hr (11/03/13 0532)   PRN Meds:.[MAR HOLD] acetaminophen, [MAR HOLD] acetaminophen, [MAR HOLD] hydrALAZINE, [MAR HOLD]  morphine injection, [MAR HOLD] ondansetron (ZOFRAN) IV, [MAR HOLD] sodium chloride  DVT Prophylaxis   Heparin - SCDs    Lab Results  Component Value Date   PLT 311 11/03/2013    Antibiotics     Anti-infectives   Start     Dose/Rate Route Frequency Ordered Stop   10/31/13 1000  [MAR Hold]  ciprofloxacin (CIPRO) IVPB 400 mg     (On MAR Hold since 11/03/13 0858)   400 mg 200 mL/hr over 60 Minutes Intravenous Every 12 hours 10/31/13 0022     10/31/13 0000  [MAR Hold]  metroNIDAZOLE (FLAGYL) IVPB 500 mg     (On MAR Hold since 11/03/13 0858)   500 mg 100 mL/hr over 60 Minutes Intravenous Every 8 hours 10/30/13 2252     10/31/13 0000  ciprofloxacin (CIPRO) IVPB 400 mg     400 mg 200 mL/hr over 60 Minutes Intravenous NOW 10/30/13 2255 10/31/13 0111          Objective:   Filed Vitals:   11/02/13 0419 11/02/13 1424 11/02/13 2038 11/03/13 0411  BP: 115/73 127/85 121/81 122/72  Pulse: 86 82 90  74  Temp: 99.5 F (37.5 C) 98.6 F (37 C) 99.7 F (37.6 C) 98 F (36.7 C)  TempSrc: Oral Oral Oral Oral  Resp: 16 18 18 20   Height:      Weight:      SpO2: 93% 92% 94% 98%    Wt Readings from Last 3 Encounters:  10/30/13 70.6 kg (155 lb 10.3 oz)  10/30/13 70.6 kg (155 lb 10.3 oz)     Intake/Output Summary (Last 24 hours) at 11/03/13 0906 Last data filed at 11/03/13 0837  Gross per 24 hour  Intake 321.25 ml  Output    250 ml  Net  71.25 ml     Physical Exam  Awake Alert, Oriented X 3, No new F.N deficits, Normal affect [place].AT,PERRAL Supple Neck,No JVD, No cervical lymphadenopathy appriciated.  Symmetrical Chest wall movement, Good air movement bilaterally, CTAB RRR,No Gallops,Rubs or new Murmurs, No Parasternal Heave +ve B.Sounds, Abd Soft, +ve LLQ tenderness, No organomegaly  appriciated, No rebound - guarding or rigidity. No Cyanosis, Clubbing or edema, No new Rash or bruise      Data Review   Micro Results Recent Results (from the past 240 hour(s))  SURGICAL PCR SCREEN     Status: None   Collection Time    11/01/13 12:12 AM      Result Value Ref Range Status   MRSA, PCR NEGATIVE  NEGATIVE Final   Staphylococcus aureus NEGATIVE  NEGATIVE Final   Comment:            The Xpert SA Assay (FDA     approved for NASAL specimens     in patients over 55 years of age),     is one component of     a comprehensive surveillance     program.  Test performance has     been validated by [employer] for patients greater     than or equal to 85 year old.     It is not intended     to diagnose infection nor to     guide or monitor treatment.    Radiology Reports No results found.  CBC  Recent Labs Lab 10/30/13 2348 10/31/13 0508 11/01/13 0529 11/02/13 0615 11/03/13 0550  WBC 11.1* 10.1 12.7* 13.2* 10.9*  HGB 13.7 13.9 14.2 13.5 12.6*  HCT 40.3 40.9 42.4 40.0 37.0*  PLT 274 291 301 294 311  MCV 92.0 92.3 93.4 91.7 89.4  MCH 31.3 31.4 31.3 31.0 30.4  MCHC 34.0 34.0 33.5 33.8 34.1  RDW 13.0 13.1 13.2 13.1 13.0  LYMPHSABS 2.2  --   --   --   --   MONOABS 0.6  --   --   --   --   EOSABS 0.0  --   --   --   --   BASOSABS 0.0  --   --   --   --     Chemistries   Recent Labs Lab 10/30/13 2348 10/31/13 0508 11/01/13 0529 11/02/13 0615 11/03/13 0550  NA 138 138 137 138 137  K 4.2 4.1 4.5 3.5* 3.6*  CL 100 98 100 104 103  CO2 25 24 21 21 23   GLUCOSE 260* 266* 169* 143* 135*  BUN 10 11 13 12 11   CREATININE 0.95 0.95 1.05 0.85 0.90  CALCIUM 9.5 9.5 10.3 9.5 9.2  MG  --   --   --   --  1.9  AST 10  --  9 7  11  ALT 15  --  10 8 8   ALKPHOS 75  --  84 104 79  BILITOT 0.3  --  0.3 0.3 0.3   ------------------------------------------------------------------------------------------------------------------ estimated creatinine clearance is 72.9  ml/min (by C-G formula based on Cr of 0.9). ------------------------------------------------------------------------------------------------------------------ No results found for this basename: HGBA1C,  in the last 72 hours ------------------------------------------------------------------------------------------------------------------ No results found for this basename: CHOL, HDL, LDLCALC, TRIG, CHOLHDL, LDLDIRECT,  in the last 72 hours ------------------------------------------------------------------------------------------------------------------ No results found for this basename: TSH, T4TOTAL, FREET3, T3FREE, THYROIDAB,  in the last 72 hours ------------------------------------------------------------------------------------------------------------------ No results found for this basename: VITAMINB12, FOLATE, FERRITIN, TIBC, IRON, RETICCTPCT,  in the last 72 hours  Coagulation profile No results found for this basename: INR, PROTIME,  in the last 168 hours  No results found for this basename: DDIMER,  in the last 72 hours  Cardiac Enzymes No results found for this basename: CK, CKMB, TROPONINI, MYOGLOBIN,  in the last 168 hours ------------------------------------------------------------------------------------------------------------------ No components found with this basename: POCBNP,      Time Spent in minutes   35   Cuong Moorman K M.D on 11/03/2013 at 9:06 AM  Between 7am to 7pm - Pager - [phone]  After 7pm go to [email or web address] - password [email or web address]  And look for the night coverage person covering for me after hours  [facility] Office  [phone]   **Disclaimer: This note may have been dictated with voice recognition software. Similar sounding words can inadvertently be transcribed and this note may contain transcription errors which may not have been corrected upon publication of note.**

## 2013-11-03 NOTE — Op Note (Signed)
Pre-op Diagnosis:  Necrotic mass in the greater omentum Postop diagnosis: Same Procedure performed: Exploratory laparotomy with partial omentectomy and lysis of adhesions Surgeon: Monti Villers K. Anesthesia: Gen. Endotracheal Indications: This is a 68 year old male who presents with several months of mild discomfort in his left lower quadrant. This has developed into significant pain. He was evaluated in a emergency department at outside hospital.  He was noted to have a inflammatory, necrotic mass in this greater omentum. This was felt to be suspicious for some type of sarcoma or GI stromal tumor. He presents now for resection.  Description of procedure: The patient brought to the operating room and placed in a supine position on the operating room table. After an adequate level of general anesthesia was obtained, a Foley catheter was placed in the still technique. The patient's abdomen was shaved, prepped with ChloraPrep and draped in sterile fashion. A timeout was taken to ensure the proper patient proper procedure. We made a vertical midline incision. Dissection was carried down to the linea alba which was divided with cautery. We entered the peritoneal cavity. A moderate amount of thin ascites was encountered. This was suctioned out and we explored the abdomen. There were some inflammatory adhesions to the anterior abdominal wall. These were bluntly dissected. There is some fibrinous exudate in the pelvis but no sign of bowel perforation. A firm mass was noted in the greater omentum. This was delivered up to the field. We bluntly dissected the adhesions around this mass. This does not seem to involve the transverse colon. The LigaSure device was used to resect this portion of the omentum. The specimen was sent for pathologic examination. We then identified the cecum. The small bowel was run from the ileocecal valve to the ligament of Treitz. There were some inflammatory adhesions but no sign of  perforation. There is no sign of obstruction. The proximal small bowel that seemed be more dilated and this may represent a mild ileus. The examined portions of the colon appeared to be normal. There is no sign of perforation the pelvis. We then irrigated the abdomen thoroughly. We inspected for hemostasis. The fascia was reapproximated with double-stranded #1 PDS suture. The subcutaneous tissues were irrigated and staples were used to close the skin. A dry dressing was applied. The patient was then extubated and brought to recovery in stable condition. All sponge, initially, and needle counts are correct.  Imogene Burn. Georgette Dover, MD, [facility]  General/ Trauma Surgery  11/03/2013 11:35 AM

## 2013-11-03 NOTE — Transfer of Care (Signed)
Immediate Anesthesia Transfer of Care Note  Patient: Adrian Cross  Procedure(s) Performed: Procedure(s): EXPLORATORY LAPAROTOMY for resection of omental mass (N/A)  Patient Location: PACU  Anesthesia Type:General  Level of Consciousness: awake, alert , oriented and patient cooperative  Airway & Oxygen Therapy: Patient Spontanous Breathing and Patient connected to nasal cannula oxygen  Post-op Assessment: Report given to PACU RN and Post -op Vital signs reviewed and stable  Post vital signs: Reviewed  Complications: No apparent anesthesia complications

## 2013-11-03 NOTE — Anesthesia Procedure Notes (Signed)
Procedure Name: Intubation Date/Time: 11/03/2013 10:27 AM Performed by: Jenne Campus Pre-anesthesia Checklist: Patient identified, Emergency Drugs available, Suction available, Patient being monitored and Timeout performed Patient Re-evaluated:Patient Re-evaluated prior to inductionOxygen Delivery Method: Circle system utilized Preoxygenation: Pre-oxygenation with 100% oxygen Intubation Type: IV induction and Cricoid Pressure applied Grade View: Grade I Tube type: Oral Tube size: 7.5 mm Number of attempts: 1 Airway Equipment and Method: Stylet Placement Confirmation: ETT inserted through vocal cords under direct vision,  positive ETCO2,  CO2 detector and breath sounds checked- equal and bilateral Secured at: 23 cm Tube secured with: Tape Dental Injury: Teeth and Oropharynx as per pre-operative assessment

## 2013-11-03 NOTE — Anesthesia Postprocedure Evaluation (Signed)
  Anesthesia Post-op Note  Patient: Adrian Cross  Procedure(s) Performed: Procedure(s): EXPLORATORY LAPAROTOMY for resection of omental mass (N/A)  Patient Location: PACU  Anesthesia Type: General   Level of Consciousness: awake, alert  and oriented  Airway and Oxygen Therapy: Patient Spontanous Breathing  Post-op Pain: mild  Post-op Assessment: Post-op Vital signs reviewed  Post-op Vital Signs: Reviewed  Last Vitals:  Filed Vitals:   11/03/13 1326  BP: 115/74  Pulse: 87  Temp:   Resp: 22    Complications: No apparent anesthesia complications

## 2013-11-04 LAB — GLUCOSE, CAPILLARY
GLUCOSE-CAPILLARY: 159 mg/dL — AB (ref 70–99)
GLUCOSE-CAPILLARY: 118 mg/dL — AB (ref 70–99)
Glucose-Capillary: 154 mg/dL — ABNORMAL HIGH (ref 70–99)
Glucose-Capillary: 166 mg/dL — ABNORMAL HIGH (ref 70–99)
Glucose-Capillary: 173 mg/dL — ABNORMAL HIGH (ref 70–99)
Glucose-Capillary: 180 mg/dL — ABNORMAL HIGH (ref 70–99)
Glucose-Capillary: 196 mg/dL — ABNORMAL HIGH (ref 70–99)

## 2013-11-04 LAB — CBC
HCT: 37.4 % — ABNORMAL LOW (ref 39.0–52.0)
Hemoglobin: 13.1 g/dL (ref 13.0–17.0)
MCH: 30.8 pg (ref 26.0–34.0)
MCHC: 35 g/dL (ref 30.0–36.0)
MCV: 87.8 fL (ref 78.0–100.0)
Platelets: 340 10*3/uL (ref 150–400)
RBC: 4.26 MIL/uL (ref 4.22–5.81)
RDW: 13.1 % (ref 11.5–15.5)
WBC: 15.6 10*3/uL — ABNORMAL HIGH (ref 4.0–10.5)

## 2013-11-04 LAB — COMPREHENSIVE METABOLIC PANEL
ALK PHOS: 70 U/L (ref 39–117)
ALT: 10 U/L (ref 0–53)
ANION GAP: 11 (ref 5–15)
AST: 15 U/L (ref 0–37)
Albumin: 1.9 g/dL — ABNORMAL LOW (ref 3.5–5.2)
BILIRUBIN TOTAL: 0.2 mg/dL — AB (ref 0.3–1.2)
BUN: 12 mg/dL (ref 6–23)
CHLORIDE: 106 meq/L (ref 96–112)
CO2: 23 meq/L (ref 19–32)
CREATININE: 0.87 mg/dL (ref 0.50–1.35)
Calcium: 9.5 mg/dL (ref 8.4–10.5)
GFR calc Af Amer: >90 mL/min (ref >90)
GFR calc non Af Amer: 88 mL/min — ABNORMAL LOW (ref >90)
Glucose, Bld: 162 mg/dL — ABNORMAL HIGH (ref 70–99)
Potassium: 4.5 mEq/L (ref 3.7–5.3)
Sodium: 140 mEq/L (ref 137–147)
Total Protein: 5.8 g/dL — ABNORMAL LOW (ref 6.0–8.3)

## 2013-11-04 MED ORDER — INSULIN GLARGINE 100 UNIT/ML ~~LOC~~ SOLN
5.0000 [IU] | Freq: Every day | SUBCUTANEOUS | Status: DC
  Administered 2013-11-04 – 2013-11-08 (×5): 5 [IU] via SUBCUTANEOUS
  Filled 2013-11-04 (×6): qty 0.05

## 2013-11-04 MED ORDER — POTASSIUM CHLORIDE IN NACL 20-0.9 MEQ/L-% IV SOLN
INTRAVENOUS | Status: DC
  Administered 2013-11-04: 75 mL/h via INTRAVENOUS
  Administered 2013-11-05 – 2013-11-07 (×4): via INTRAVENOUS
  Filled 2013-11-04 (×8): qty 1000

## 2013-11-04 MED ORDER — MORPHINE SULFATE (PF) 1 MG/ML IV SOLN
INTRAVENOUS | Status: DC
  Administered 2013-11-04: 3 mg via INTRAVENOUS

## 2013-11-04 MED ORDER — ALBUTEROL SULFATE (2.5 MG/3ML) 0.083% IN NEBU: 2.5000 mg | INHALATION_SOLUTION | RESPIRATORY_TRACT | Status: DC | PRN

## 2013-11-04 NOTE — Progress Notes (Signed)
1 Day Post-Op  Subjective: Appears fairly comfortable in bed.  No flatus, no nausea, site is dry.  Objective: Vital signs in last 24 hours: Temp:  [97.8 F (36.6 C)-98.5 F (36.9 C)] 98.5 F (36.9 C) (08/18 0410) Pulse Rate:  [58-91] 80 (08/18 0410) Resp:  [11-28] 20 (08/18 0410) BP: (99-144)/(53-84) 138/79 mmHg (08/18 0410) SpO2:  [94 %-100 %] 95 % (08/18 0410) Last BM Date: 11/03/13 NPO x ice chips Afebrile, VSS Glucose and WBC is up some. Intake/Output from previous day: 08/17 0701 - 08/18 0700 In: 3036.3 [I.V.:2736.3; IV Piggyback:300] Out: 1050 [Urine:1050] Intake/Output this shift: Total I/O In: -  Out: 15 [Urine:15]  General appearance: alert, cooperative and no distress Resp: clear to auscultation bilaterally GI: soft, tender no BS the dressing is dry.    Lab Results:   Recent Labs  11/03/13 1330 11/04/13 0547  WBC 13.8* 15.6*  HGB 13.3 13.1  HCT 38.6* 37.4*  PLT 314 340    BMET  Recent Labs  11/03/13 0550 11/03/13 1330 11/04/13 0547  NA 137  --  140  K 3.6*  --  4.5  CL 103  --  106  CO2 23  --  23  GLUCOSE 135*  --  162*  BUN 11  --  12  CREATININE 0.90 0.83 0.87  CALCIUM 9.2  --  9.5   PT/INR No results found for this basename: LABPROT, INR,  in the last 72 hours   Recent Labs Lab 10/30/13 2348 11/01/13 0529 11/02/13 0615 11/03/13 0550 11/04/13 0547  AST 10 9 7 11 15   ALT 15 10 8 8 10   ALKPHOS 75 84 104 79 70  BILITOT 0.3 0.3 0.3 0.3 0.2*  PROT 6.5 6.5 5.9* 5.7* 5.8*  ALBUMIN 2.5* 2.2* 1.9* 1.9* 1.9*     Lipase  No results found for this basename: lipase     Studies/Results: No results found.  Medications: . ciprofloxacin  400 mg Intravenous Q12H  . heparin  5,000 Units Subcutaneous 3 times per day  . insulin aspart  0-9 Units Subcutaneous Q4H  . insulin glargine  5 Units Subcutaneous Daily  . metronidazole  500 mg Intravenous Q8H  . pantoprazole (PROTONIX) IV  40 mg Intravenous Q24H    Assessment/Plan 1.   Necrotic mass in the greater omentum  S/p Exploratory laparotomy with partial omentectomy and lysis of adhesions, 11/03/2013, Imogene Burn.  Tsuei, MD 2.  AODM type II 3.  Hypertension 4.  GERD 5.  Tobacco use   Plan:  Mobilize and sips of clears today.  Continue PCA.  Path pending. Recheck CBC it was up this AM.   LOS: 5 days    Braedin Millhouse 11/04/2013

## 2013-11-04 NOTE — Progress Notes (Signed)
Patient Demographics  Adrian Cross, is a 68 y.o. male, DOB - [birth year], ZDG:[id number]  Admit date - 10/30/2013   Admitting Physician Rise Patience, MD  Outpatient Primary MD for the patient is HILL,CHARLES, MD  LOS - 5   No chief complaint on file.       Subjective:   Adrian Cross today has, No headache, No chest pain, mild postop abdominal pain, no nausea not passing flatus yet , No new weakness tingling or numbness, No Cough - SOB.   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 +  IVF + epiric antibiotics. On IV morphine PCA for pain relief, increase activity, I S every hour., follow pathology results.   Diabetes mellitus  2 - since patient is n.p.o. I have placed patient on sliding-scale coverage for now + low dose Lantus. Closely follow CBGs.   CBG (last 3)   Recent Labs  11/03/13 2343 11/04/13 0432 11/04/13 0751  GLUCAP 196* 154* 159*      Hypertension - patient's blood pressure is in the low-normal. Hold antihypertensives. Continue hydration. When necessary IV hydralazine for systolic blood pressure more than 160.    Low K - replaced, monitor K levels closely on normal saline with 20 of Kcl/lit.      Code Status: full  Family Communication: none  Disposition Plan: home   Procedures Ct abd-pelvis,    Consults CCS   Medications  Scheduled Meds: . ciprofloxacin  400 mg Intravenous Q12H  . heparin  5,000 Units Subcutaneous 3 times per day  . insulin aspart  0-9 Units Subcutaneous Q4H  . insulin glargine  5 Units Subcutaneous Daily  . metronidazole  500 mg Intravenous Q8H  . morphine   Intravenous 6 times per day  .  pantoprazole (PROTONIX) IV  40 mg Intravenous Q24H   Continuous Infusions: . 0.9 % NaCl with KCl 20 mEq / L     PRN Meds:.acetaminophen, acetaminophen, albuterol, hydrALAZINE, morphine injection, ondansetron (ZOFRAN) IV, sodium chloride  DVT Prophylaxis   Heparin - SCDs    Lab Results  Component Value Date   PLT 340 11/04/2013    Antibiotics     Anti-infectives   Start     Dose/Rate Route Frequency Ordered Stop   10/31/13 1000  ciprofloxacin (CIPRO) IVPB 400 mg     400 mg 200 mL/hr over 60 Minutes Intravenous Every 12 hours 10/31/13 0022     10/31/13 0000  metroNIDAZOLE (FLAGYL) IVPB 500 mg     500 mg 100 mL/hr over 60 Minutes Intravenous Every 8 hours 10/30/13 2252     10/31/13 0000  ciprofloxacin (CIPRO) IVPB 400 mg     400 mg 200 mL/hr over 60 Minutes Intravenous NOW 10/30/13 2255 10/31/13 0111          Objective:   Filed Vitals:   11/04/13 0002 11/04/13 0400 11/04/13 0410 11/04/13 0815  BP: 133/72  138/79   Pulse: 75  80   Temp: 98.3 F (36.8 C)  98.5 F (36.9 C)   TempSrc: Oral  Oral   Resp: 20 20 20 26   Height:  Weight:      SpO2: 97%  95% 98%    Wt Readings from Last 3 Encounters:  10/30/13 70.6 kg (155 lb 10.3 oz)  10/30/13 70.6 kg (155 lb 10.3 oz)     Intake/Output Summary (Last 24 hours) at 11/04/13 0945 Last data filed at 11/04/13 5329  Gross per 24 hour  Intake   2715 ml  Output   1065 ml  Net   1650 ml     Physical Exam  Awake Alert, Oriented X 3, No new F.N deficits, Normal affect [place].AT,PERRAL Supple Neck,No JVD, No cervical lymphadenopathy appriciated.  Symmetrical Chest wall movement, Good air movement bilaterally, CTAB RRR,No Gallops,Rubs or new Murmurs, No Parasternal Heave No bowel sounds, midline abdominal incision site appears clean, No organomegaly appriciated, No rebound - guarding or rigidity. No Cyanosis, Clubbing or edema, No new Rash or bruise      Data Review   Micro Results Recent Results (from the past 240  hour(s))  SURGICAL PCR SCREEN     Status: None   Collection Time    11/01/13 12:12 AM      Result Value Ref Range Status   MRSA, PCR NEGATIVE  NEGATIVE Final   Staphylococcus aureus NEGATIVE  NEGATIVE Final   Comment:            The Xpert SA Assay (FDA     approved for NASAL specimens     in patients over 38 years of age),     is one component of     a comprehensive surveillance     program.  Test performance has     been validated by [employer] for patients greater     than or equal to 77 year old.     It is not intended     to diagnose infection nor to     guide or monitor treatment.    Radiology Reports No results found.  CBC  Recent Labs Lab 10/30/13 2348  11/01/13 0529 11/02/13 0615 11/03/13 0550 11/03/13 1330 11/04/13 0547  WBC 11.1*  < > 12.7* 13.2* 10.9* 13.8* 15.6*  HGB 13.7  < > 14.2 13.5 12.6* 13.3 13.1  HCT 40.3  < > 42.4 40.0 37.0* 38.6* 37.4*  PLT 274  < > 301 294 311 314 340  MCV 92.0  < > 93.4 91.7 89.4 88.7 87.8  MCH 31.3  < > 31.3 31.0 30.4 30.6 30.8  MCHC 34.0  < > 33.5 33.8 34.1 34.5 35.0  RDW 13.0  < > 13.2 13.1 13.0 13.0 13.1  LYMPHSABS 2.2  --   --   --   --   --   --   MONOABS 0.6  --   --   --   --   --   --   EOSABS 0.0  --   --   --   --   --   --   BASOSABS 0.0  --   --   --   --   --   --   < > = values in this interval not displayed.  Chemistries   Recent Labs Lab 10/30/13 2348 10/31/13 0508 11/01/13 0529 11/02/13 0615 11/03/13 0550 11/03/13 1330 11/04/13 0547  NA 138 138 137 138 137  --  140  K 4.2 4.1 4.5 3.5* 3.6*  --  4.5  CL 100 98 100 104 103  --  106  CO2 25 24 21 21 23   --  23  GLUCOSE 260* 266* 169* 143* 135*  --  162*  BUN 10 11 13 12 11   --  12  CREATININE 0.95 0.95 1.05 0.85 0.90 0.83 0.87  CALCIUM 9.5 9.5 10.3 9.5 9.2  --  9.5  MG  --   --   --   --  1.9  --   --   AST 10  --  9 7 11   --  15  ALT 15  --  10 8 8   --  10  ALKPHOS 75  --  84 104 79  --  70  BILITOT 0.3  --  0.3 0.3 0.3  --  0.2*    ------------------------------------------------------------------------------------------------------------------ estimated creatinine clearance is 75.4 ml/min (by C-G formula based on Cr of 0.87). ------------------------------------------------------------------------------------------------------------------ No results found for this basename: HGBA1C,  in the last 72 hours ------------------------------------------------------------------------------------------------------------------ No results found for this basename: CHOL, HDL, LDLCALC, TRIG, CHOLHDL, LDLDIRECT,  in the last 72 hours ------------------------------------------------------------------------------------------------------------------ No results found for this basename: TSH, T4TOTAL, FREET3, T3FREE, THYROIDAB,  in the last 72 hours ------------------------------------------------------------------------------------------------------------------ No results found for this basename: VITAMINB12, FOLATE, FERRITIN, TIBC, IRON, RETICCTPCT,  in the last 72 hours  Coagulation profile No results found for this basename: INR, PROTIME,  in the last 168 hours  No results found for this basename: DDIMER,  in the last 72 hours  Cardiac Enzymes No results found for this basename: CK, CKMB, TROPONINI, MYOGLOBIN,  in the last 168 hours ------------------------------------------------------------------------------------------------------------------ No components found with this basename: POCBNP,      Time Spent in minutes   35   Rashawn Rolon K M.D on 11/04/2013 at 9:45 AM  Between 7am to 7pm - Pager - [phone]  After 7pm go to [email or web address] - password [email or web address]  And look for the night coverage person covering for me after hours  [facility] Office  [phone]   **Disclaimer: This note may have been dictated with voice recognition software. Similar sounding words can inadvertently be transcribed and this  note may contain transcription errors which may not have been corrected upon publication of note.**

## 2013-11-04 NOTE — Progress Notes (Signed)
Sore, but LLQ tenderness seems to be ready.  AF/ VSS   Awaiting pathology report.  Slow advance of diet - he may develop an ileus because his bowel seemed mildly dilated at the time of surgery.  Imogene Burn. Georgette Dover, MD, [facility]  General/ Trauma Surgery  11/04/2013 10:54 AM

## 2013-11-05 ENCOUNTER — Encounter (HOSPITAL_COMMUNITY): Payer: Self-pay | Admitting: Surgery

## 2013-11-05 LAB — BASIC METABOLIC PANEL
ANION GAP: 13 (ref 5–15)
BUN: 11 mg/dL (ref 6–23)
CALCIUM: 9.5 mg/dL (ref 8.4–10.5)
CO2: 22 meq/L (ref 19–32)
CREATININE: 0.79 mg/dL (ref 0.50–1.35)
Chloride: 106 mEq/L (ref 96–112)
GFR calc non Af Amer: >90 mL/min (ref >90)
Glucose, Bld: 173 mg/dL — ABNORMAL HIGH (ref 70–99)
Potassium: 4 mEq/L (ref 3.7–5.3)
SODIUM: 141 meq/L (ref 137–147)

## 2013-11-05 LAB — GLUCOSE, CAPILLARY
GLUCOSE-CAPILLARY: 173 mg/dL — AB (ref 70–99)
GLUCOSE-CAPILLARY: 160 mg/dL — AB (ref 70–99)
GLUCOSE-CAPILLARY: 142 mg/dL — AB (ref 70–99)
Glucose-Capillary: 193 mg/dL — ABNORMAL HIGH (ref 70–99)

## 2013-11-05 LAB — CBC
HCT: 37.7 % — ABNORMAL LOW (ref 39.0–52.0)
HEMOGLOBIN: 13 g/dL (ref 13.0–17.0)
MCH: 30.5 pg (ref 26.0–34.0)
MCHC: 34.5 g/dL (ref 30.0–36.0)
MCV: 88.5 fL (ref 78.0–100.0)
PLATELETS: 349 10*3/uL (ref 150–400)
RBC: 4.26 MIL/uL (ref 4.22–5.81)
RDW: 13.4 % (ref 11.5–15.5)
WBC: 11.3 10*3/uL — ABNORMAL HIGH (ref 4.0–10.5)

## 2013-11-05 LAB — MAGNESIUM: MAGNESIUM: 1.9 mg/dL (ref 1.5–2.5)

## 2013-11-05 MED ORDER — ACETAMINOPHEN 160 MG/5ML PO SOLN: 650.0000 mg | ORAL | Status: DC | PRN

## 2013-11-05 MED ORDER — OXYCODONE HCL 5 MG/5ML PO SOLN: 5.0000 mg | ORAL | Status: DC | PRN

## 2013-11-05 MED ORDER — MORPHINE SULFATE 2 MG/ML IJ SOLN: 1.0000 mg | INTRAMUSCULAR | Status: DC | PRN

## 2013-11-05 NOTE — Progress Notes (Signed)
Minimal flatus Mildly distended Ambulating with assistance  Sips of clears today Awaiting full bowel function  Path - abscess ?perforated diverticulum? Although no sign of diverticulitis at the time of surgery.  Continue abx for now until normal WBC   Imogene Burn. Georgette Dover, MD, [facility]  General/ Trauma Surgery  11/05/2013 10:13 AM

## 2013-11-05 NOTE — Progress Notes (Signed)
2 Days Post-Op  Subjective: He says he had one walk yesterday, no flatus, he does not move well in be at all.  Objective: Vital signs in last 24 hours: Temp:  [98.1 F (36.7 C)-98.7 F (37.1 C)] 98.7 F (37.1 C) (08/19 0602) Pulse Rate:  [75-79] 78 (08/19 0602) Resp:  [20-27] 22 (08/19 0602) BP: (144-161)/(71-78) 144/71 mmHg (08/19 0602) SpO2:  [96 %-100 %] 98 % (08/19 0602) Last BM Date: 11/03/13 Ice chips/sips Afebrile, VSS Labs OK, glucose up some WBC improving Omentum, resection for tumor - DENSELY INFLAMED FIBROADIPOSE TISSUE WITH NECROSIS, CONSISTENT WITH ABSCESS. - THERE IS NO EVIDENCE OF MALIGNANCY. - SEE COMMENT. Intake/Output from previous day: 08/18 0701 - 08/19 0700 In: 925 [P.O.:40; I.V.:485; IV Piggyback:400] Out: 515 [Urine:515] Intake/Output this shift:    General appearance: alert, cooperative and no distress Resp: clear to auscultation bilaterally GI: Incision looks fine, no distension, few very hypoactive bowel sounds.  Lab Results:   Recent Labs  11/04/13 0547 11/05/13 0500  WBC 15.6* 11.3*  HGB 13.1 13.0  HCT 37.4* 37.7*  PLT 340 349    BMET  Recent Labs  11/04/13 0547 11/05/13 0500  NA 140 141  K 4.5 4.0  CL 106 106  CO2 23 22  GLUCOSE 162* 173*  BUN 12 11  CREATININE 0.87 0.79  CALCIUM 9.5 9.5   PT/INR No results found for this basename: LABPROT, INR,  in the last 72 hours   Recent Labs Lab 10/30/13 2348 11/01/13 0529 11/02/13 0615 11/03/13 0550 11/04/13 0547  AST 10 9 7 11 15   ALT 15 10 8 8 10   ALKPHOS 75 84 104 79 70  BILITOT 0.3 0.3 0.3 0.3 0.2*  PROT 6.5 6.5 5.9* 5.7* 5.8*  ALBUMIN 2.5* 2.2* 1.9* 1.9* 1.9*     Lipase  No results found for this basename: lipase     Studies/Results: No results found.  Medications: . ciprofloxacin  400 mg Intravenous Q12H  . heparin  5,000 Units Subcutaneous 3 times per day  . insulin aspart  0-9 Units Subcutaneous Q4H  . insulin glargine  5 Units Subcutaneous Daily  .  metronidazole  500 mg Intravenous Q8H  . morphine   Intravenous 6 times per day  . pantoprazole (PROTONIX) IV  40 mg Intravenous Q24H   Omentum, resection for tumor - DENSELY INFLAMED FIBROADIPOSE TISSUE WITH NECROSIS, CONSISTENT WITH ABSCESS. - THERE IS NO EVIDENCE OF MALIGNANCY. - SEE COMMENT. Assessment/Plan 1. Necrotic mass in the greater omentum  S/p Exploratory laparotomy with partial omentectomy and lysis of adhesions, 11/03/2013, Imogene Burn. Tsuei, MD  2. AODM type II  3. Hypertension  4. GERD  5. Tobacco use   Plan:  I will give him a clear tray, i told him to take it in sips, hold if he has nausea.  He needs to walk.   Nurse notes he is not using PCA so I will stop and give PO meds. With Morphine still available as needed.  LOS: 6 days    Claus Silvestro 11/05/2013

## 2013-11-05 NOTE — Progress Notes (Signed)
PROGRESS NOTE  Adrian Cross HUD:[id number] DOB: 12-15-1945 DOA: 10/30/2013 PCP: Fanny Bien, MD  Assessment/Plan: Omental Mass -pathology--no malignancy--> fiber adipose tissue with necrosis c/w abscess -continue cipro and flagyl -11/03/13-S/p Exploratory laparotomy with partial omentectomy and lysis of adhesions, 11/03/2013, Dr. Imogene Burn. Tsuei -11/05/2013 started on clear liquids -Still no flatus postoperatively Diabetes mellitus type 2 -Hold glyburide for now -Hemoglobin A1c -Continue Lantus and NovoLog sliding scale Hypertension -Monitor off of lisinopril  Tobacco abuse -Tobacco cessation discussed Hypokalemia -Repleted    Family Communication:   Pt at beside Disposition Plan:   Home when medically stable       Procedures/Studies: Dg Chest 2 View  11/01/2013   CLINICAL DATA:  Preop for abdominal mass  EXAM: CHEST  2 VIEW  COMPARISON:  CT 10/30/2013  FINDINGS: Normal cardiac silhouette. There is linear markings at the lung bases corresponds linear atelectasis on comparison CT. Upper lungs are clear. No pleural fluid.  IMPRESSION: 1. No interval change from recent CT. 2. Bibasilar atelectasis.   Electronically Signed   By: Suzy Bouchard M.D.   On: 11/01/2013 15:58         Subjective: Patient complains of abdominal pain 5/10. Has some nausea without any emesis. Denies fevers, chills, chest pain, shortness breath, dysuria, hematuria, rashes.   Objective: Filed Vitals:   11/04/13 1600 11/04/13 2124 11/05/13 0602 11/05/13 1439  BP:  161/78 144/71 132/78  Pulse:  79 78 93  Temp:  98.4 F (36.9 C) 98.7 F (37.1 C) 98.3 F (36.8 C)  TempSrc:  Oral Oral Oral  Resp: 27 22 22 20   Height:      Weight:      SpO2: 96% 97% 98% 100%    Intake/Output Summary (Last 24 hours) at 11/05/13 1831 Last data filed at 11/05/13 0800  Gross per 24 hour  Intake     65 ml  Output    600 ml  Net   -535 ml   Weight change:  Exam:   General:  Pt is alert,  follows commands appropriately, not in acute distress  HEENT: No icterus, No thrush,[place]/AT  Cardiovascular: RRR, S1/S2, no rubs, no gallops  Respiratory: Bibasilar crackles. No wheezing. Good air movement.   Abdomen: Soft/+BS, non tender, non distended, no guarding  Extremities: No edema, No lymphangitis, No petechiae, No rashes, no synovitis  Data Reviewed: Basic Metabolic Panel:  Recent Labs Lab 11/01/13 0529 11/02/13 0615 11/03/13 0550 11/03/13 1330 11/04/13 0547 11/05/13 0500  NA 137 138 137  --  140 141  K 4.5 3.5* 3.6*  --  4.5 4.0  CL 100 104 103  --  106 106  CO2 21 21 23   --  23 22  GLUCOSE 169* 143* 135*  --  162* 173*  BUN 13 12 11   --  12 11  CREATININE 1.05 0.85 0.90 0.83 0.87 0.79  CALCIUM 10.3 9.5 9.2  --  9.5 9.5  MG  --   --  1.9  --   --  1.9   Liver Function Tests:  Recent Labs Lab 10/30/13 2348 11/01/13 0529 11/02/13 0615 11/03/13 0550 11/04/13 0547  AST 10 9 7 11 15   ALT 15 10 8 8 10   ALKPHOS 75 84 104 79 70  BILITOT 0.3 0.3 0.3 0.3 0.2*  PROT 6.5 6.5 5.9* 5.7* 5.8*  ALBUMIN 2.5* 2.2* 1.9* 1.9* 1.9*   No results found for this basename: LIPASE, AMYLASE,  in the  last 168 hours No results found for this basename: AMMONIA,  in the last 168 hours CBC:  Recent Labs Lab 10/30/13 2348  11/02/13 0615 11/03/13 0550 11/03/13 1330 11/04/13 0547 11/05/13 0500  WBC 11.1*  < > 13.2* 10.9* 13.8* 15.6* 11.3*  NEUTROABS 8.3*  --   --   --   --   --   --   HGB 13.7  < > 13.5 12.6* 13.3 13.1 13.0  HCT 40.3  < > 40.0 37.0* 38.6* 37.4* 37.7*  MCV 92.0  < > 91.7 89.4 88.7 87.8 88.5  PLT 274  < > 294 311 314 340 349  < > = values in this interval not displayed. Cardiac Enzymes: No results found for this basename: CKTOTAL, CKMB, CKMBINDEX, TROPONINI,  in the last 168 hours BNP: No components found with this basename: POCBNP,  CBG:  Recent Labs Lab 11/04/13 1941 11/04/13 2334 11/05/13 0418 11/05/13 0753 11/05/13 1210  GLUCAP 118* 173* 173*  160* 142*    Recent Results (from the past 240 hour(s))  SURGICAL PCR SCREEN     Status: None   Collection Time    11/01/13 12:12 AM      Result Value Ref Range Status   MRSA, PCR NEGATIVE  NEGATIVE Final   Staphylococcus aureus NEGATIVE  NEGATIVE Final   Comment:            The Xpert SA Assay (FDA     approved for NASAL specimens     in patients over 23 years of age),     is one component of     a comprehensive surveillance     program.  Test performance has     been validated by [employer] for patients greater     than or equal to 25 year old.     It is not intended     to diagnose infection nor to     guide or monitor treatment.     Scheduled Meds: . heparin  5,000 Units Subcutaneous 3 times per day  . insulin aspart  0-9 Units Subcutaneous Q4H  . insulin glargine  5 Units Subcutaneous Daily  . metronidazole  500 mg Intravenous Q8H  . pantoprazole (PROTONIX) IV  40 mg Intravenous Q24H   Continuous Infusions: . 0.9 % NaCl with KCl 20 mEq / L 75 mL/hr at 11/05/13 0649     Oluwadamilola Deliz, DO  [facility] Pager [phone]  If 7PM-7AM, please contact night-coverage [email or web address] Password [facility] 11/05/2013, 6:31 PM   LOS: 6 days

## 2013-11-06 ENCOUNTER — Inpatient Hospital Stay (HOSPITAL_COMMUNITY): Payer: 59

## 2013-11-06 DIAGNOSIS — K567 Ileus, unspecified: Secondary | ICD-10-CM

## 2013-11-06 DIAGNOSIS — K9189 Other postprocedural complications and disorders of digestive system: Secondary | ICD-10-CM

## 2013-11-06 DIAGNOSIS — K560 Paralytic ileus: Secondary | ICD-10-CM

## 2013-11-06 DIAGNOSIS — K651 Peritoneal abscess: Principal | ICD-10-CM

## 2013-11-06 DIAGNOSIS — K929 Disease of digestive system, unspecified: Secondary | ICD-10-CM

## 2013-11-06 LAB — GLUCOSE, CAPILLARY
GLUCOSE-CAPILLARY: 189 mg/dL — AB (ref 70–99)
GLUCOSE-CAPILLARY: 187 mg/dL — AB (ref 70–99)
GLUCOSE-CAPILLARY: 150 mg/dL — AB (ref 70–99)
GLUCOSE-CAPILLARY: 178 mg/dL — AB (ref 70–99)
Glucose-Capillary: 179 mg/dL — ABNORMAL HIGH (ref 70–99)
Glucose-Capillary: 198 mg/dL — ABNORMAL HIGH (ref 70–99)

## 2013-11-06 LAB — CBC
HCT: 40.6 % (ref 39.0–52.0)
Hemoglobin: 13.9 g/dL (ref 13.0–17.0)
MCH: 30.6 pg (ref 26.0–34.0)
MCHC: 34.2 g/dL (ref 30.0–36.0)
MCV: 89.4 fL (ref 78.0–100.0)
Platelets: 349 10*3/uL (ref 150–400)
RBC: 4.54 MIL/uL (ref 4.22–5.81)
RDW: 13.6 % (ref 11.5–15.5)
WBC: 9.2 10*3/uL (ref 4.0–10.5)

## 2013-11-06 LAB — BASIC METABOLIC PANEL
ANION GAP: 12 (ref 5–15)
BUN: 11 mg/dL (ref 6–23)
CALCIUM: 9.6 mg/dL (ref 8.4–10.5)
CO2: 21 mEq/L (ref 19–32)
CREATININE: 0.88 mg/dL (ref 0.50–1.35)
Chloride: 105 mEq/L (ref 96–112)
GFR calc non Af Amer: 88 mL/min — ABNORMAL LOW (ref >90)
Glucose, Bld: 191 mg/dL — ABNORMAL HIGH (ref 70–99)
Potassium: 4.3 mEq/L (ref 3.7–5.3)
Sodium: 138 mEq/L (ref 137–147)

## 2013-11-06 LAB — HEMOGLOBIN A1C
HEMOGLOBIN A1C: 11.9 % — AB (ref <5.7)
Mean Plasma Glucose: 295 mg/dL — ABNORMAL HIGH (ref <117)

## 2013-11-06 MED ORDER — BISACODYL 10 MG RE SUPP
10.0000 mg | Freq: Once | RECTAL | Status: AC
  Administered 2013-11-06: 10 mg via RECTAL
  Filled 2013-11-06: qty 1

## 2013-11-06 NOTE — Progress Notes (Signed)
Patient ID: Adrian Cross, male   DOB: 1945/08/05, 68 y.o.   MRN: [id number]     [place]      [address] [place], [place] [address]    Phone: [phone] FAX: [phone]     Subjective: Sitting up in  A chair.  Vomited this AM, then had clears and so far is keeping it down.  No flatus or BM.  Not walking much, but planning on today. VSS.  Afebrile.    Objective:  Vital signs:  Filed Vitals:   11/05/13 0800 11/05/13 1439 11/05/13 2011 11/06/13 0513  BP:  132/78 158/82 151/78  Pulse:  93 95 89  Temp:  98.3 F (36.8 C) 99.1 F (37.3 C) 98.1 F (36.7 C)  TempSrc:  Oral Oral Oral  Resp: 24 20 20 20   Height:      Weight:      SpO2: 98% 100% 95% 93%    Last BM Date: 11/03/13  Intake/Output   Yesterday:  08/19 0701 - 08/20 0700 In: 400 [IV Piggyback:400] Out: 402 [Urine:400; Emesis/NG output:2] This shift:    I/O last 3 completed shifts: In: 400 [IV Piggyback:400] Out: 34 [Urine:800; Emesis/NG output:2]    Physical Exam: General: Pt awake/alert/oriented x4 in no acute distress Abdomen: +BS.  distended.  Mild tenderness around incision.  Midline incision with approximated wound edges, staples are in place.   No evidence of peritonitis.  No incarcerated hernias.   Problem List:   Principal Problem:   Abdominal abscess Active Problems:   HTN (hypertension)   Diabetes mellitus   Abdominal mass    Results:   Labs: Results for orders placed during the hospital encounter of 10/30/13 (from the past 48 hour(s))  GLUCOSE, CAPILLARY     Status: Abnormal   Collection Time    11/04/13 11:49 AM      Result Value Ref Range   Glucose-Capillary 180 (*) 70 - 99 mg/dL  GLUCOSE, CAPILLARY     Status: Abnormal   Collection Time    11/04/13  3:54 PM      Result Value Ref Range   Glucose-Capillary 166 (*) 70 - 99 mg/dL  GLUCOSE, CAPILLARY     Status: Abnormal   Collection Time    11/04/13  7:41 PM      Result Value  Ref Range   Glucose-Capillary 118 (*) 70 - 99 mg/dL   Comment 1 Notify RN     Comment 2 Documented in Chart    GLUCOSE, CAPILLARY     Status: Abnormal   Collection Time    11/04/13 11:34 PM      Result Value Ref Range   Glucose-Capillary 173 (*) 70 - 99 mg/dL   Comment 1 Notify RN     Comment 2 Documented in Chart    GLUCOSE, CAPILLARY     Status: Abnormal   Collection Time    11/05/13  4:18 AM      Result Value Ref Range   Glucose-Capillary 173 (*) 70 - 99 mg/dL   Comment 1 Notify RN     Comment 2 Documented in Chart    CBC     Status: Abnormal   Collection Time    11/05/13  5:00 AM      Result Value Ref Range   WBC 11.3 (*) 4.0 - 10.5 K/uL   RBC 4.26  4.22 - 5.81 MIL/uL   Hemoglobin 13.0  13.0 - 17.0 g/dL   HCT  37.7 (*) 39.0 - 52.0 %   MCV 88.5  78.0 - 100.0 fL   MCH 30.5  26.0 - 34.0 pg   MCHC 34.5  30.0 - 36.0 g/dL   RDW 13.4  11.5 - 15.5 %   Platelets 349  150 - 400 K/uL  MAGNESIUM     Status: None   Collection Time    11/05/13  5:00 AM      Result Value Ref Range   Magnesium 1.9  1.5 - 2.5 mg/dL  BASIC METABOLIC PANEL     Status: Abnormal   Collection Time    11/05/13  5:00 AM      Result Value Ref Range   Sodium 141  137 - 147 mEq/L   Potassium 4.0  3.7 - 5.3 mEq/L   Chloride 106  96 - 112 mEq/L   CO2 22  19 - 32 mEq/L   Glucose, Bld 173 (*) 70 - 99 mg/dL   BUN 11  6 - 23 mg/dL   Creatinine, Ser 0.79  0.50 - 1.35 mg/dL   Calcium 9.5  8.4 - 10.5 mg/dL   GFR calc non Af Amer >90  >90 mL/min   GFR calc Af Amer >90  >90 mL/min   Comment: (NOTE)     The eGFR has been calculated using the CKD EPI equation.     This calculation has not been validated in all clinical situations.     eGFR's persistently <90 mL/min signify possible Chronic Kidney     Disease.   Anion gap 13  5 - 15  GLUCOSE, CAPILLARY     Status: Abnormal   Collection Time    11/05/13  7:53 AM      Result Value Ref Range   Glucose-Capillary 160 (*) 70 - 99 mg/dL   Comment 1 Documented in  Chart    GLUCOSE, CAPILLARY     Status: Abnormal   Collection Time    11/05/13 12:10 PM      Result Value Ref Range   Glucose-Capillary 142 (*) 70 - 99 mg/dL  GLUCOSE, CAPILLARY     Status: Abnormal   Collection Time    11/05/13  8:08 PM      Result Value Ref Range   Glucose-Capillary 193 (*) 70 - 99 mg/dL   Comment 1 Notify RN     Comment 2 Documented in Chart    GLUCOSE, CAPILLARY     Status: Abnormal   Collection Time    11/06/13 12:04 AM      Result Value Ref Range   Glucose-Capillary 179 (*) 70 - 99 mg/dL   Comment 1 Documented in Chart     Comment 2 Notify RN    GLUCOSE, CAPILLARY     Status: Abnormal   Collection Time    11/06/13  4:39 AM      Result Value Ref Range   Glucose-Capillary 189 (*) 70 - 99 mg/dL   Comment 1 Documented in Chart     Comment 2 Notify RN    BASIC METABOLIC PANEL     Status: Abnormal   Collection Time    11/06/13  5:57 AM      Result Value Ref Range   Sodium 138  137 - 147 mEq/L   Potassium 4.3  3.7 - 5.3 mEq/L   Chloride 105  96 - 112 mEq/L   CO2 21  19 - 32 mEq/L   Glucose, Bld 191 (*) 70 - 99 mg/dL   BUN 11  6 - 23 mg/dL   Creatinine, Ser 0.88  0.50 - 1.35 mg/dL   Calcium 9.6  8.4 - 10.5 mg/dL   GFR calc non Af Amer 88 (*) >90 mL/min   GFR calc Af Amer >90  >90 mL/min   Comment: (NOTE)     The eGFR has been calculated using the CKD EPI equation.     This calculation has not been validated in all clinical situations.     eGFR's persistently <90 mL/min signify possible Chronic Kidney     Disease.   Anion gap 12  5 - 15  CBC     Status: None   Collection Time    11/06/13  5:57 AM      Result Value Ref Range   WBC 9.2  4.0 - 10.5 K/uL   RBC 4.54  4.22 - 5.81 MIL/uL   Hemoglobin 13.9  13.0 - 17.0 g/dL   HCT 40.6  39.0 - 52.0 %   MCV 89.4  78.0 - 100.0 fL   MCH 30.6  26.0 - 34.0 pg   MCHC 34.2  30.0 - 36.0 g/dL   RDW 13.6  11.5 - 15.5 %   Platelets 349  150 - 400 K/uL  GLUCOSE, CAPILLARY     Status: Abnormal   Collection Time     11/06/13  8:05 AM      Result Value Ref Range   Glucose-Capillary 187 (*) 70 - 99 mg/dL    Imaging / Studies: No results found.  Medications / Allergies:  Scheduled Meds: . heparin  5,000 Units Subcutaneous 3 times per day  . insulin aspart  0-9 Units Subcutaneous Q4H  . insulin glargine  5 Units Subcutaneous Daily  . metronidazole  500 mg Intravenous Q8H  . pantoprazole (PROTONIX) IV  40 mg Intravenous Q24H   Continuous Infusions: . 0.9 % NaCl with KCl 20 mEq / L 75 mL/hr at 11/05/13 2303   PRN Meds:.acetaminophen, acetaminophen, acetaminophen, albuterol, hydrALAZINE, morphine injection, ondansetron (ZOFRAN) IV, oxyCODONE, sodium chloride  Antibiotics: Anti-infectives   Start     Dose/Rate Route Frequency Ordered Stop   10/31/13 1000  ciprofloxacin (CIPRO) IVPB 400 mg  Status:  Discontinued     400 mg 200 mL/hr over 60 Minutes Intravenous Every 12 hours 10/31/13 0022 11/05/13 0839   10/31/13 0000  metroNIDAZOLE (FLAGYL) IVPB 500 mg     500 mg 100 mL/hr over 60 Minutes Intravenous Every 8 hours 10/30/13 2252     10/31/13 0000  ciprofloxacin (CIPRO) IVPB 400 mg     400 mg 200 mL/hr over 60 Minutes Intravenous NOW 10/30/13 2255 10/31/13 0111      Assessment/Plan 1. Necrotic mass in the greater omentum  S/p Exploratory laparotomy with partial omentectomy and lysis of adhesions, 11/03/2013, Imogene Burn. Tsuei, MD  2. AODM type II  3. Hypertension  4. GERD  5. Tobacco use  6. Post op ileus  -POD#3 -Will make him NPO, NGT if she continues to vomit -Obtain AXR -Needs to mobilize -Give dulcolax  Erby Pian, ANP-BC [facility] Pager [phone](7A-4:30P) For consults and floor pages call [phone](7A-4:30P)  11/06/2013 10:17 AM

## 2013-11-06 NOTE — Progress Notes (Signed)
PROGRESS NOTE  Adrian Cross OQH:[id number] DOB: 1945/12/06 DOA: 10/30/2013 PCP: Adrian Bien, MD  Assessment/Plan: Omental Mass  -pathology--no malignancy--> fiber adipose tissue with necrosis c/w abscess  -continue cipro and flagyl  -11/03/13-S/p Exploratory laparotomy with partial omentectomy and lysis of adhesions, 11/03/2013, Dr. Imogene Burn. Tsuei  -11/05/2013 started on clear liquids  -Still no flatus postoperatively  Postoperative Ileus -AXR c/w ileus -vomited this am -npo for now -appreciate surgery followup -bisacodyl supp today -ambulate Diabetes mellitus type 2  -Hold glyburide for now  -Hemoglobin A1c-11.9  -Continue Lantus and NovoLog sliding scale  Hypertension  -Monitor off of lisinopril  -restart po anti-HTN when able to take po -prn hydralazine Tobacco abuse  -Tobacco cessation discussed  Hypokalemia  -Repleted     Family Communication:   Pt at beside Disposition Plan:   Home when medically stable       Procedures/Studies: Dg Chest 2 View  11/01/2013   CLINICAL DATA:  Preop for abdominal mass  EXAM: CHEST  2 VIEW  COMPARISON:  CT 10/30/2013  FINDINGS: Normal cardiac silhouette. There is linear markings at the lung bases corresponds linear atelectasis on comparison CT. Upper lungs are clear. No pleural fluid.  IMPRESSION: 1. No interval change from recent CT. 2. Bibasilar atelectasis.   Electronically Signed   By: Suzy Bouchard M.D.   On: 11/01/2013 15:58   Dg Abd 2 Views  11/06/2013   CLINICAL DATA:  Postoperative ileus; constipation  EXAM: ABDOMEN - 2 VIEW  COMPARISON:  CT abdomen and pelvis October 30, 2013  FINDINGS: Supine and upright images were obtained. There are multiple skin staples. There is generalized bowel dilatation with scattered air-fluid levels. Contrast is noted in the colon, and there is a small amount of air in the rectum. No free air.  IMPRESSION: The appearance is consistent with postoperative ileus. A degree of partial  obstruction cannot be entirely excluded, however. No free air.   Electronically Signed   By: Lowella Grip M.D.   On: 11/06/2013 11:47         Subjective: Patient had an episode of emesis this morning but no emesis since then. Abdominal pain is controlled. Denies any fevers, chills, chest pain, shortness breath, cough, hemoptysis, dysuria, hematuria.  Objective: Filed Vitals:   11/05/13 1439 11/05/13 2011 11/06/13 0513 11/06/13 1328  BP: 132/78 158/82 151/78 158/94  Pulse: 93 95 89 86  Temp: 98.3 F (36.8 C) 99.1 F (37.3 C) 98.1 F (36.7 C) 98.7 F (37.1 C)  TempSrc: Oral Oral Oral Oral  Resp: 20 20 20 18   Height:      Weight:      SpO2: 100% 95% 93% 96%    Intake/Output Summary (Last 24 hours) at 11/06/13 1845 Last data filed at 11/06/13 0659  Gross per 24 hour  Intake    400 ml  Output      2 ml  Net    398 ml   Weight change:  Exam:   General:  Pt is alert, follows commands appropriately, not in acute distress  HEENT: No icterus, No thrush,/AT  Cardiovascular: RRR, S1/S2, no rubs, no gallops  Respiratory: Diminished breath sounds but clear to auscultation. No wheezing.  Abdomen: Soft/+BS, mild diffuse tender without peritoneal sign no guarding  Extremities: No edema, No lymphangitis, No petechiae, No rashes, no synovitis  Data Reviewed: Basic Metabolic Panel:  Recent Labs Lab 11/02/13 0615 11/03/13 0550 11/03/13 1330 11/04/13 0547 11/05/13 0500 11/06/13  0557  NA 138 137  --  140 141 138  K 3.5* 3.6*  --  4.5 4.0 4.3  CL 104 103  --  106 106 105  CO2 21 23  --  23 22 21   GLUCOSE 143* 135*  --  162* 173* 191*  BUN 12 11  --  12 11 11   CREATININE 0.85 0.90 0.83 0.87 0.79 0.88  CALCIUM 9.5 9.2  --  9.5 9.5 9.6  MG  --  1.9  --   --  1.9  --    Liver Function Tests:  Recent Labs Lab 10/30/13 2348 11/01/13 0529 11/02/13 0615 11/03/13 0550 11/04/13 0547  AST 10 9 7 11 15   ALT 15 10 8 8 10   ALKPHOS 75 84 104 79 70  BILITOT 0.3 0.3  0.3 0.3 0.2*  PROT 6.5 6.5 5.9* 5.7* 5.8*  ALBUMIN 2.5* 2.2* 1.9* 1.9* 1.9*   No results found for this basename: LIPASE, AMYLASE,  in the last 168 hours No results found for this basename: AMMONIA,  in the last 168 hours CBC:  Recent Labs Lab 10/30/13 2348  11/03/13 0550 11/03/13 1330 11/04/13 0547 11/05/13 0500 11/06/13 0557  WBC 11.1*  < > 10.9* 13.8* 15.6* 11.3* 9.2  NEUTROABS 8.3*  --   --   --   --   --   --   HGB 13.7  < > 12.6* 13.3 13.1 13.0 13.9  HCT 40.3  < > 37.0* 38.6* 37.4* 37.7* 40.6  MCV 92.0  < > 89.4 88.7 87.8 88.5 89.4  PLT 274  < > 311 314 340 349 349  < > = values in this interval not displayed. Cardiac Enzymes: No results found for this basename: CKTOTAL, CKMB, CKMBINDEX, TROPONINI,  in the last 168 hours BNP: No components found with this basename: POCBNP,  CBG:  Recent Labs Lab 11/06/13 0004 11/06/13 0439 11/06/13 0805 11/06/13 1325 11/06/13 1643  GLUCAP 179* 189* 187* 198* 150*    Recent Results (from the past 240 hour(s))  SURGICAL PCR SCREEN     Status: None   Collection Time    11/01/13 12:12 AM      Result Value Ref Range Status   MRSA, PCR NEGATIVE  NEGATIVE Final   Staphylococcus aureus NEGATIVE  NEGATIVE Final   Comment:            The Xpert SA Assay (FDA     approved for NASAL specimens     in patients over 2 years of age),     is one component of     a comprehensive surveillance     program.  Test performance has     been validated by [employer] for patients greater     than or equal to 8 year old.     It is not intended     to diagnose infection nor to     guide or monitor treatment.     Scheduled Meds: . heparin  5,000 Units Subcutaneous 3 times per day  . insulin aspart  0-9 Units Subcutaneous Q4H  . insulin glargine  5 Units Subcutaneous Daily  . metronidazole  500 mg Intravenous Q8H  . pantoprazole (PROTONIX) IV  40 mg Intravenous Q24H   Continuous Infusions: . 0.9 % NaCl with KCl 20 mEq / L 75 mL/hr at  11/05/13 2303     [name], DO  [facility] Pager [phone]  If 7PM-7AM, please contact night-coverage [email or web address]  Password [email or web address] 11/06/2013, 6:45 PM   LOS: 7 days

## 2013-11-06 NOTE — Progress Notes (Signed)
Patient with some post-op ileus.   NPO for now. Ambulate  Imogene Burn. Georgette Dover, MD, [facility]  General/ Trauma Surgery  11/06/2013 4:00 PM

## 2013-11-06 NOTE — Clinical Social Work Note (Signed)
CSW received call from a [employer]. She states that she is a CM with "[employer]" and can assist with any HH, DME, etc for patient if needed. Number for Judeen Hammans is [phone] Ext [phone].   Liz Beach MSW, [place], [place], [phone]

## 2013-11-07 LAB — GLUCOSE, CAPILLARY
GLUCOSE-CAPILLARY: 124 mg/dL — AB (ref 70–99)
GLUCOSE-CAPILLARY: 131 mg/dL — AB (ref 70–99)
GLUCOSE-CAPILLARY: 134 mg/dL — AB (ref 70–99)
GLUCOSE-CAPILLARY: 188 mg/dL — AB (ref 70–99)
Glucose-Capillary: 210 mg/dL — ABNORMAL HIGH (ref 70–99)
Glucose-Capillary: 264 mg/dL — ABNORMAL HIGH (ref 70–99)

## 2013-11-07 LAB — BASIC METABOLIC PANEL
Anion gap: 11 (ref 5–15)
BUN: 10 mg/dL (ref 6–23)
CALCIUM: 9.2 mg/dL (ref 8.4–10.5)
CO2: 24 meq/L (ref 19–32)
CREATININE: 0.86 mg/dL (ref 0.50–1.35)
Chloride: 108 mEq/L (ref 96–112)
GFR calc Af Amer: >90 mL/min (ref >90)
GFR, EST NON AFRICAN AMERICAN: 88 mL/min — AB (ref >90)
Glucose, Bld: 136 mg/dL — ABNORMAL HIGH (ref 70–99)
Potassium: 4.7 mEq/L (ref 3.7–5.3)
SODIUM: 143 meq/L (ref 137–147)

## 2013-11-07 MED ORDER — INSULIN ASPART 100 UNIT/ML ~~LOC~~ SOLN
0.0000 [IU] | Freq: Three times a day (TID) | SUBCUTANEOUS | Status: DC
  Administered 2013-11-08 (×3): 3 [IU] via SUBCUTANEOUS

## 2013-11-07 MED ORDER — CIPROFLOXACIN IN D5W 400 MG/200ML IV SOLN
400.0000 mg | Freq: Two times a day (BID) | INTRAVENOUS | Status: DC
  Administered 2013-11-07 – 2013-11-08 (×3): 400 mg via INTRAVENOUS
  Filled 2013-11-07 (×4): qty 200

## 2013-11-07 MED ORDER — POLYETHYLENE GLYCOL 3350 17 G PO PACK
17.0000 g | PACK | Freq: Every day | ORAL | Status: DC
  Administered 2013-11-07 – 2013-11-08 (×2): 17 g via ORAL
  Filled 2013-11-07 (×3): qty 1

## 2013-11-07 MED ORDER — INSULIN ASPART 100 UNIT/ML ~~LOC~~ SOLN: 0.0000 [IU] | Freq: Every day | SUBCUTANEOUS | Status: DC

## 2013-11-07 NOTE — Progress Notes (Signed)
Patient had two BM today. Tolerating clears Feel great Will advance as tolerated. Home soon.  Imogene Burn. Georgette Dover, MD, [facility]  General/ Trauma Surgery  11/07/2013 3:14 PM

## 2013-11-07 NOTE — Progress Notes (Signed)
Patient ID: Adrian Cross, male   DOB: [birth year], 68 y.o.   MRN: [id number]     [facility]      [address] [place], [place] [address]    Phone: [phone] FAX: [phone]     Subjective: Has a BM early this AM.  C/o hiccups.  No n/v.   Objective:  Vital signs:  Filed Vitals:   11/06/13 0513 11/06/13 1328 11/06/13 2156 11/07/13 0450  BP: 151/78 158/94 141/88 141/72  Pulse: 89 86 99 86  Temp: 98.1 F (36.7 C) 98.7 F (37.1 C) 99.1 F (37.3 C) 98.4 F (36.9 C)  TempSrc: Oral Oral Oral Oral  Resp: 20 18 18 18   Height:      Weight:      SpO2: 93% 96% 93% 93%    Last BM Date: 11/03/13  Intake/Output   Yesterday:    This shift:    I/O last 3 completed shifts: In: 400 [IV Piggyback:400] Out: 2 [Emesis/NG output:2]    Physical Exam: General: Pt awake/alert/oriented x4 in no acute distress Abdomen: +BS. Soft.  distended.  Midline incision with approximated wound edges, staples are in place.  No evidence of peritonitis.  No incarcerated hernias.   Problem List:   Principal Problem:   Abdominal abscess Active Problems:   HTN (hypertension)   Diabetes mellitus   Abdominal mass   Ileus, postoperative    Results:   Labs: Results for orders placed during the hospital encounter of 10/30/13 (from the past 48 hour(s))  GLUCOSE, CAPILLARY     Status: Abnormal   Collection Time    11/05/13 12:10 PM      Result Value Ref Range   Glucose-Capillary 142 (*) 70 - 99 mg/dL  GLUCOSE, CAPILLARY     Status: Abnormal   Collection Time    11/05/13  8:08 PM      Result Value Ref Range   Glucose-Capillary 193 (*) 70 - 99 mg/dL   Comment 1 Notify RN     Comment 2 Documented in Chart    GLUCOSE, CAPILLARY     Status: Abnormal   Collection Time    11/06/13 12:04 AM      Result Value Ref Range   Glucose-Capillary 179 (*) 70 - 99 mg/dL   Comment 1 Documented in Chart     Comment 2 Notify RN    GLUCOSE, CAPILLARY      Status: Abnormal   Collection Time    11/06/13  4:39 AM      Result Value Ref Range   Glucose-Capillary 189 (*) 70 - 99 mg/dL   Comment 1 Documented in Chart     Comment 2 Notify RN    BASIC METABOLIC PANEL     Status: Abnormal   Collection Time    11/06/13  5:57 AM      Result Value Ref Range   Sodium 138  137 - 147 mEq/L   Potassium 4.3  3.7 - 5.3 mEq/L   Chloride 105  96 - 112 mEq/L   CO2 21  19 - 32 mEq/L   Glucose, Bld 191 (*) 70 - 99 mg/dL   BUN 11  6 - 23 mg/dL   Creatinine, Ser 0.88  0.50 - 1.35 mg/dL   Calcium 9.6  8.4 - 10.5 mg/dL   GFR calc non Af Amer 88 (*) >90 mL/min   GFR calc Af Amer >90  >90 mL/min   Comment: (NOTE)  The eGFR has been calculated using the CKD EPI equation.     This calculation has not been validated in all clinical situations.     eGFR's persistently <90 mL/min signify possible Chronic Kidney     Disease.   Anion gap 12  5 - 15  CBC     Status: None   Collection Time    11/06/13  5:57 AM      Result Value Ref Range   WBC 9.2  4.0 - 10.5 K/uL   RBC 4.54  4.22 - 5.81 MIL/uL   Hemoglobin 13.9  13.0 - 17.0 g/dL   HCT 40.6  39.0 - 52.0 %   MCV 89.4  78.0 - 100.0 fL   MCH 30.6  26.0 - 34.0 pg   MCHC 34.2  30.0 - 36.0 g/dL   RDW 13.6  11.5 - 15.5 %   Platelets 349  150 - 400 K/uL  HEMOGLOBIN A1C     Status: Abnormal   Collection Time    11/06/13  5:57 AM      Result Value Ref Range   Hemoglobin A1C 11.9 (*) <5.7 %   Comment: (NOTE)                                                                               According to the ADA Clinical Practice Recommendations for 2011, when     HbA1c is used as a screening test:      >=6.5%   Diagnostic of Diabetes Mellitus               (if abnormal result is confirmed)     5.7-6.4%   Increased risk of developing Diabetes Mellitus     References:Diagnosis and Classification of Diabetes Mellitus,Diabetes     GGEZ,6629,47(MLYYT 1):S62-S69 and Standards of Medical Care in             Diabetes -  2011,Diabetes Care,2011,34 (Suppl 1):S11-S61.   Mean Plasma Glucose 295 (*) <117 mg/dL   Comment: Performed at [place], CAPILLARY     Status: Abnormal   Collection Time    11/06/13  8:05 AM      Result Value Ref Range   Glucose-Capillary 187 (*) 70 - 99 mg/dL  GLUCOSE, CAPILLARY     Status: Abnormal   Collection Time    11/06/13  1:25 PM      Result Value Ref Range   Glucose-Capillary 198 (*) 70 - 99 mg/dL  GLUCOSE, CAPILLARY     Status: Abnormal   Collection Time    11/06/13  4:43 PM      Result Value Ref Range   Glucose-Capillary 150 (*) 70 - 99 mg/dL  GLUCOSE, CAPILLARY     Status: Abnormal   Collection Time    11/06/13  7:49 PM      Result Value Ref Range   Glucose-Capillary 178 (*) 70 - 99 mg/dL   Comment 1 Notify RN     Comment 2 Documented in Chart    GLUCOSE, CAPILLARY     Status: Abnormal   Collection Time    11/07/13 12:02 AM      Result Value Ref Range  Glucose-Capillary 131 (*) 70 - 99 mg/dL  GLUCOSE, CAPILLARY     Status: Abnormal   Collection Time    11/07/13  4:45 AM      Result Value Ref Range   Glucose-Capillary 134 (*) 70 - 99 mg/dL   Comment 1 Notify RN     Comment 2 Documented in Chart    BASIC METABOLIC PANEL     Status: Abnormal   Collection Time    11/07/13  6:25 AM      Result Value Ref Range   Sodium 143  137 - 147 mEq/L   Potassium 4.7  3.7 - 5.3 mEq/L   Chloride 108  96 - 112 mEq/L   CO2 24  19 - 32 mEq/L   Glucose, Bld 136 (*) 70 - 99 mg/dL   BUN 10  6 - 23 mg/dL   Creatinine, Ser 0.86  0.50 - 1.35 mg/dL   Calcium 9.2  8.4 - 10.5 mg/dL   GFR calc non Af Amer 88 (*) >90 mL/min   GFR calc Af Amer >90  >90 mL/min   Comment: (NOTE)     The eGFR has been calculated using the CKD EPI equation.     This calculation has not been validated in all clinical situations.     eGFR's persistently <90 mL/min signify possible Chronic Kidney     Disease.   Anion gap 11  5 - 15  GLUCOSE, CAPILLARY     Status: Abnormal    Collection Time    11/07/13  7:43 AM      Result Value Ref Range   Glucose-Capillary 124 (*) 70 - 99 mg/dL    Imaging / Studies: Dg Abd 2 Views  11/06/2013   CLINICAL DATA:  Postoperative ileus; constipation  EXAM: ABDOMEN - 2 VIEW  COMPARISON:  CT abdomen and pelvis October 30, 2013  FINDINGS: Supine and upright images were obtained. There are multiple skin staples. There is generalized bowel dilatation with scattered air-fluid levels. Contrast is noted in the colon, and there is a small amount of air in the rectum. No free air.  IMPRESSION: The appearance is consistent with postoperative ileus. A degree of partial obstruction cannot be entirely excluded, however. No free air.   Electronically Signed   By: Lowella Grip M.D.   On: 11/06/2013 11:47    Medications / Allergies:  Scheduled Meds: . heparin  5,000 Units Subcutaneous 3 times per day  . insulin aspart  0-9 Units Subcutaneous Q4H  . insulin glargine  5 Units Subcutaneous Daily  . metronidazole  500 mg Intravenous Q8H  . pantoprazole (PROTONIX) IV  40 mg Intravenous Q24H  . polyethylene glycol  17 g Oral Daily   Continuous Infusions: . 0.9 % NaCl with KCl 20 mEq / L 75 mL/hr at 11/07/13 0257   PRN Meds:.acetaminophen, acetaminophen, acetaminophen, albuterol, hydrALAZINE, morphine injection, ondansetron (ZOFRAN) IV, oxyCODONE, sodium chloride  Antibiotics: Anti-infectives   Start     Dose/Rate Route Frequency Ordered Stop   10/31/13 1000  ciprofloxacin (CIPRO) IVPB 400 mg  Status:  Discontinued     400 mg 200 mL/hr over 60 Minutes Intravenous Every 12 hours 10/31/13 0022 11/05/13 0839   10/31/13 0000  metroNIDAZOLE (FLAGYL) IVPB 500 mg     500 mg 100 mL/hr over 60 Minutes Intravenous Every 8 hours 10/30/13 2252     10/31/13 0000  ciprofloxacin (CIPRO) IVPB 400 mg     400 mg 200 mL/hr over 60 Minutes Intravenous NOW 10/30/13  2255 10/31/13 0111        Assessment/Plan 1. 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 Tsuei, MD  2. AODM type II  3. Hypertension  4. GERD  5. Tobacco use  6. Post op ileus   -POD#4 -will allow for clears today -mobilize -pain control -IS -start miralax -c/w atbx   Erby Pian, ANP-BC [facility] Pager [phone](7A-4:30P) For consults and floor pages call [phone](7A-4:30P)  11/07/2013 9:55 AM

## 2013-11-07 NOTE — Progress Notes (Signed)
PROGRESS NOTE  Adrian Cross QQV:[id number] DOB: 1946/02/02 DOA: 10/30/2013 PCP: Fanny Bien, MD  Assessment/Plan: Omental Mass  -pathology--no malignancy--> fiber adipose tissue with necrosis c/w abscess  -continue cipro and flagyl  -11/03/13-S/p Exploratory laparotomy with partial omentectomy and lysis of adhesions, 11/03/2013, Dr. Imogene Burn. Tsuei  -11/05/2013 started on clear liquids  Postoperative Ileus  -8/20--AXR c/w ileus -now clinically improving  -Patient had 4 bowel movements in the last 24 hours -Tolerating clear liquids -That was advanced to full liquids -appreciate surgery followup  -ambulate  -saline lock ivf Diabetes mellitus type 2  -Hold glyburide for now  -Patient states that he was taking Lantus at home for the last several months, but he does not know the dose -Hemoglobin A1c-11.9  -Continue Lantus and NovoLog sliding scale  -Change to moderate sliding scale- Hypertension  -Monitor off of lisinopril  -restart po anti-HTN when able to take po  -prn hydralazine  Tobacco abuse  -Tobacco cessation discussed  Hypokalemia  -Repleted    Family Communication:   Pt at beside Disposition Plan:   Home when medically stable      Procedures/Studies: Dg Chest 2 View  11/01/2013   CLINICAL DATA:  Preop for abdominal mass  EXAM: CHEST  2 VIEW  COMPARISON:  CT 10/30/2013  FINDINGS: Normal cardiac silhouette. There is linear markings at the lung bases corresponds linear atelectasis on comparison CT. Upper lungs are clear. No pleural fluid.  IMPRESSION: 1. No interval change from recent CT. 2. Bibasilar atelectasis.   Electronically Signed   By: Suzy Bouchard M.D.   On: 11/01/2013 15:58   Dg Abd 2 Views  11/06/2013   CLINICAL DATA:  Postoperative ileus; constipation  EXAM: ABDOMEN - 2 VIEW  COMPARISON:  CT abdomen and pelvis October 30, 2013  FINDINGS: Supine and upright images were obtained. There are multiple skin staples. There is generalized bowel  dilatation with scattered air-fluid levels. Contrast is noted in the colon, and there is a small amount of air in the rectum. No free air.  IMPRESSION: The appearance is consistent with postoperative ileus. A degree of partial obstruction cannot be entirely excluded, however. No free air.   Electronically Signed   By: Lowella Grip M.D.   On: 11/06/2013 11:47         Subjective: Patient had 4 bowel movements in the last 24 hours. No hematochezia or melena. Denies fevers, chills, chest pain, shortness breath, nausea, vomiting, diarrhea, dysuria, hematuria   Objective: Filed Vitals:   11/06/13 1328 11/06/13 2156 11/07/13 0450 11/07/13 1314  BP: 158/94 141/88 141/72 132/87  Pulse: 86 99 86 96  Temp: 98.7 F (37.1 C) 99.1 F (37.3 C) 98.4 F (36.9 C) 98.4 F (36.9 C)  TempSrc: Oral Oral Oral Oral  Resp: 18 18 18 18   Height:      Weight:      SpO2: 96% 93% 93% 95%    Intake/Output Summary (Last 24 hours) at 11/07/13 1934 Last data filed at 11/07/13 1421  Gross per 24 hour  Intake   1395 ml  Output      0 ml  Net   1395 ml   Weight change:  Exam:   General:  Pt is alert, follows commands appropriately, not in acute distress  HEENT: No icterus, No thrush, [place]/AT  Cardiovascular: RRR, S1/S2, no rubs, no gallops  Respiratory: CTA bilaterally, no wheezing, no crackles, no rhonchi  Abdomen: Soft/+BS, mild tenderness diffusely, non  distended, no guarding  Extremities: trace LE edema, No lymphangitis, No petechiae, No rashes, no synovitis  Data Reviewed: Basic Metabolic Panel:  Recent Labs Lab 11/02/13 0615 11/03/13 0550 11/03/13 1330 11/04/13 0547 11/05/13 0500 11/06/13 0557 11/07/13 0625  NA 138 137  --  140 141 138 143  K 3.5* 3.6*  --  4.5 4.0 4.3 4.7  CL 104 103  --  106 106 105 108  CO2 21 23  --  23 22 21 24   GLUCOSE 143* 135*  --  162* 173* 191* 136*  BUN 12 11  --  12 11 11 10   CREATININE 0.85 0.90 0.83 0.87 0.79 0.88 0.86  CALCIUM 9.5 9.2  --  9.5  9.5 9.6 9.2  MG  --  1.9  --   --  1.9  --   --    Liver Function Tests:  Recent Labs Lab 11/01/13 0529 11/02/13 0615 11/03/13 0550 11/04/13 0547  AST 9 7 11 15   ALT 10 8 8 10   ALKPHOS 84 104 79 70  BILITOT 0.3 0.3 0.3 0.2*  PROT 6.5 5.9* 5.7* 5.8*  ALBUMIN 2.2* 1.9* 1.9* 1.9*   No results found for this basename: LIPASE, AMYLASE,  in the last 168 hours No results found for this basename: AMMONIA,  in the last 168 hours CBC:  Recent Labs Lab 11/03/13 0550 11/03/13 1330 11/04/13 0547 11/05/13 0500 11/06/13 0557  WBC 10.9* 13.8* 15.6* 11.3* 9.2  HGB 12.6* 13.3 13.1 13.0 13.9  HCT 37.0* 38.6* 37.4* 37.7* 40.6  MCV 89.4 88.7 87.8 88.5 89.4  PLT 311 314 340 349 349   Cardiac Enzymes: No results found for this basename: CKTOTAL, CKMB, CKMBINDEX, TROPONINI,  in the last 168 hours BNP: No components found with this basename: POCBNP,  CBG:  Recent Labs Lab 11/07/13 0002 11/07/13 0445 11/07/13 0743 11/07/13 1130 11/07/13 1649  GLUCAP 131* 134* 124* 210* 264*    Recent Results (from the past 240 hour(s))  SURGICAL PCR SCREEN     Status: None   Collection Time    11/01/13 12:12 AM      Result Value Ref Range Status   MRSA, PCR NEGATIVE  NEGATIVE Final   Staphylococcus aureus NEGATIVE  NEGATIVE Final   Comment:            The Xpert SA Assay (FDA     approved for NASAL specimens     in patients over 42 years of age),     is one component of     a comprehensive surveillance     program.  Test performance has     been validated by [employer] for patients greater     than or equal to 72 year old.     It is not intended     to diagnose infection nor to     guide or monitor treatment.     Scheduled Meds: . ciprofloxacin  400 mg Intravenous Q12H  . heparin  5,000 Units Subcutaneous 3 times per day  . [START ON 11/08/2013] insulin aspart  0-15 Units Subcutaneous TID WC  . insulin aspart  0-5 Units Subcutaneous QHS  . insulin glargine  5 Units Subcutaneous  Daily  . metronidazole  500 mg Intravenous Q8H  . pantoprazole (PROTONIX) IV  40 mg Intravenous Q24H  . polyethylene glycol  17 g Oral Daily   Continuous Infusions: . 0.9 % NaCl with KCl 20 mEq / L 75 mL/hr at 11/07/13  [place]     Onetta Spainhower, DO  [facility] Pager [phone]  If 7PM-7AM, please contact night-coverage [email or web address] Password [facility] 11/07/2013, 7:34 PM   LOS: 8 days

## 2013-11-08 LAB — BASIC METABOLIC PANEL
Anion gap: 14 (ref 5–15)
BUN: 6 mg/dL (ref 6–23)
CALCIUM: 8.8 mg/dL (ref 8.4–10.5)
CO2: 19 mEq/L (ref 19–32)
Chloride: 108 mEq/L (ref 96–112)
Creatinine, Ser: 0.71 mg/dL (ref 0.50–1.35)
GFR calc non Af Amer: >90 mL/min (ref >90)
GLUCOSE: 194 mg/dL — AB (ref 70–99)
POTASSIUM: 4.2 meq/L (ref 3.7–5.3)
SODIUM: 141 meq/L (ref 137–147)

## 2013-11-08 LAB — CBC
HCT: 38.1 % — ABNORMAL LOW (ref 39.0–52.0)
Hemoglobin: 12.6 g/dL — ABNORMAL LOW (ref 13.0–17.0)
MCH: 29.3 pg (ref 26.0–34.0)
MCHC: 33.1 g/dL (ref 30.0–36.0)
MCV: 88.6 fL (ref 78.0–100.0)
PLATELETS: 301 10*3/uL (ref 150–400)
RBC: 4.3 MIL/uL (ref 4.22–5.81)
RDW: 13.6 % (ref 11.5–15.5)
WBC: 8.8 10*3/uL (ref 4.0–10.5)

## 2013-11-08 LAB — GLUCOSE, CAPILLARY
Glucose-Capillary: 153 mg/dL — ABNORMAL HIGH (ref 70–99)
Glucose-Capillary: 179 mg/dL — ABNORMAL HIGH (ref 70–99)
Glucose-Capillary: 180 mg/dL — ABNORMAL HIGH (ref 70–99)
Glucose-Capillary: 190 mg/dL — ABNORMAL HIGH (ref 70–99)
Glucose-Capillary: 191 mg/dL — ABNORMAL HIGH (ref 70–99)
Glucose-Capillary: 199 mg/dL — ABNORMAL HIGH (ref 70–99)

## 2013-11-08 NOTE — Progress Notes (Signed)
5 Days Post-Op  Subjective: PT doing well this AM.  Pt states he had several BMs last night.  Pt c/o reflux overnight.  Asking to go home.  Objective: Vital signs in last 24 hours: Temp:  [98.3 F (36.8 C)-99.1 F (37.3 C)] 98.3 F (36.8 C) (08/22 0512) Pulse Rate:  [88-96] 88 (08/22 0512) Resp:  [16-18] 16 (08/22 0512) BP: (132-157)/(73-87) 150/73 mmHg (08/22 0512) SpO2:  [95 %-97 %] 97 % (08/22 0512) Last BM Date: 11/07/13  Intake/Output from previous day: 08/21 0701 - 08/22 0700 In: 2695 [P.O.:720; I.V.:575; IV Piggyback:1400] Out: -  Intake/Output this shift:    General appearance: alert and cooperative GI: soft, Nttp, nd, active BS, wound c/d/i  Lab Results:   Recent Labs  11-Nov-2013 0557  WBC 9.2  HGB 13.9  HCT 40.6  PLT 349   BMET  Recent Labs  2013/11/11 0557 11/07/13 0625  NA 138 143  K 4.3 4.7  CL 105 108  CO2 21 24  GLUCOSE 191* 136*  BUN 11 10  CREATININE 0.88 0.86  CALCIUM 9.6 9.2   PT/INR No results found for this basename: LABPROT, INR,  in the last 72 hours ABG No results found for this basename: PHART, PCO2, PO2, HCO3,  in the last 72 hours  Studies/Results: Dg Abd 2 Views  2013/11/11   CLINICAL DATA:  Postoperative ileus; constipation  EXAM: ABDOMEN - 2 VIEW  COMPARISON:  CT abdomen and pelvis October 30, 2013  FINDINGS: Supine and upright images were obtained. There are multiple skin staples. There is generalized bowel dilatation with scattered air-fluid levels. Contrast is noted in the colon, and there is a small amount of air in the rectum. No free air.  IMPRESSION: The appearance is consistent with postoperative ileus. A degree of partial obstruction cannot be entirely excluded, however. No free air.   Electronically Signed   By: Lowella Grip M.D.   On: 11/11/13 11:47    Anti-infectives: Anti-infectives   Start     Dose/Rate Route Frequency Ordered Stop   11/07/13 2045  ciprofloxacin (CIPRO) IVPB 400 mg     400 mg 200 mL/hr  over 60 Minutes Intravenous Every 12 hours 11/07/13 1934     10/31/13 1000  ciprofloxacin (CIPRO) IVPB 400 mg  Status:  Discontinued     400 mg 200 mL/hr over 60 Minutes Intravenous Every 12 hours 10/31/13 0022 11/05/13 0839   10/31/13 0000  metroNIDAZOLE (FLAGYL) IVPB 500 mg     500 mg 100 mL/hr over 60 Minutes Intravenous Every 8 hours 10/30/13 2252     10/31/13 0000  ciprofloxacin (CIPRO) IVPB 400 mg     400 mg 200 mL/hr over 60 Minutes Intravenous NOW 10/30/13 2255 10/31/13 0111      Assessment/Plan: s/p Procedure(s): EXPLORATORY LAPAROTOMY for resection of omental mass (N/A) Advance diet to Reg Mobilize Con't abx OK for DC from surgery standpoint if tolerates Reg diet.  LOS: 9 days    Rosario Jacks., Anne Hahn 11/08/2013

## 2013-11-08 NOTE — Progress Notes (Signed)
IV team notified about IV. Pt is on their list.

## 2013-11-08 NOTE — Progress Notes (Signed)
PROGRESS NOTE  Adrian Cross WPY:[id number] DOB: 1945-10-19 DOA: 10/30/2013 PCP: Fanny Bien, MD  Assessment/Plan: Omental Mass  -pathology--no malignancy--> fiber adipose tissue with necrosis c/w abscess  -continue cipro and flagyl  -11/03/13-S/p Exploratory laparotomy with partial omentectomy and lysis of adhesions, 11/03/2013, Dr. Imogene Burn. Tsuei  -11/05/2013 started on clear liquids  -11/08/13--advance to carb modified diet Postoperative Ileus  -8/20--AXR c/w ileus  -now clinically improving  -Patient had 2 BM this am -Tolerating full liquids  -11/08/13--advance to carb modified diet -appreciate surgery followup  -ambulate  -saline lock ivf  Diabetes mellitus type 2  -Hold glyburide for now  -Patient states that he was taking Lantus at home for the last several months, but he does not know the dose  -Hemoglobin A1c-11.9  -Continue Lantus and NovoLog sliding scale  -Change to moderate sliding scale-  Hypertension  -restart low dose lisinopril Tobacco abuse  -Tobacco cessation discussed  Hypokalemia  -Repleted    Family Communication:   Daughter updated at beside Disposition Plan:   Home when medically stable       Procedures/Studies: Dg Chest 2 View  11/01/2013   CLINICAL DATA:  Preop for abdominal mass  EXAM: CHEST  2 VIEW  COMPARISON:  CT 10/30/2013  FINDINGS: Normal cardiac silhouette. There is linear markings at the lung bases corresponds linear atelectasis on comparison CT. Upper lungs are clear. No pleural fluid.  IMPRESSION: 1. No interval change from recent CT. 2. Bibasilar atelectasis.   Electronically Signed   By: Suzy Bouchard M.D.   On: 11/01/2013 15:58   Dg Abd 2 Views  11/06/2013   CLINICAL DATA:  Postoperative ileus; constipation  EXAM: ABDOMEN - 2 VIEW  COMPARISON:  CT abdomen and pelvis October 30, 2013  FINDINGS: Supine and upright images were obtained. There are multiple skin staples. There is generalized bowel dilatation with  scattered air-fluid levels. Contrast is noted in the colon, and there is a small amount of air in the rectum. No free air.  IMPRESSION: The appearance is consistent with postoperative ileus. A degree of partial obstruction cannot be entirely excluded, however. No free air.   Electronically Signed   By: Lowella Grip M.D.   On: 11/06/2013 11:47         Subjective: Patient had 2 bowel movements today. Has minimal abdominal pain. Denies any nausea, vomiting, diarrhea, chest pain, shortness breath, fevers, chills, dysuria, hematuria.  Objective: Filed Vitals:   11/07/13 0450 11/07/13 1314 11/07/13 2015 11/08/13 0512  BP: 141/72 132/87 157/83 150/73  Pulse: 86 96 94 88  Temp: 98.4 F (36.9 C) 98.4 F (36.9 C) 99.1 F (37.3 C) 98.3 F (36.8 C)  TempSrc: Oral Oral Oral Oral  Resp: 18 18 17 16   Height:      Weight:      SpO2: 93% 95% 97% 97%    Intake/Output Summary (Last 24 hours) at 11/08/13 1516 Last data filed at 11/08/13 0845  Gross per 24 hour  Intake   1600 ml  Output      0 ml  Net   1600 ml   Weight change:  Exam:   General:  Pt is alert, follows commands appropriately, not in acute distress  HEENT: No icterus, No thrush,  [place]/AT  Cardiovascular: RRR, S1/S2, no rubs, no gallops  Respiratory: CTA bilaterally, no wheezing, no crackles, no rhonchi  Abdomen: Soft/+BS, non tender, mildly distended, no guarding  Extremities: No edema, No lymphangitis, No  petechiae, No rashes, no synovitis  Data Reviewed: Basic Metabolic Panel:  Recent Labs Lab 11/02/13 0615 11/03/13 0550  11/04/13 0547 11/05/13 0500 11/06/13 0557 11/07/13 0625 11/08/13 0814  NA 138 137  --  140 141 138 143 141  K 3.5* 3.6*  --  4.5 4.0 4.3 4.7 4.2  CL 104 103  --  106 106 105 108 108  CO2 21 23  --  23 22 21 24 19   GLUCOSE 143* 135*  --  162* 173* 191* 136* 194*  BUN 12 11  --  12 11 11 10 6   CREATININE 0.85 0.90  < > 0.87 0.79 0.88 0.86 0.71  CALCIUM 9.5 9.2  --  9.5 9.5 9.6 9.2  8.8  MG  --  1.9  --   --  1.9  --   --   --   < > = values in this interval not displayed. Liver Function Tests:  Recent Labs Lab 11/02/13 0615 11/03/13 0550 11/04/13 0547  AST 7 11 15   ALT 8 8 10   ALKPHOS 104 79 70  BILITOT 0.3 0.3 0.2*  PROT 5.9* 5.7* 5.8*  ALBUMIN 1.9* 1.9* 1.9*   No results found for this basename: LIPASE, AMYLASE,  in the last 168 hours No results found for this basename: AMMONIA,  in the last 168 hours CBC:  Recent Labs Lab 11/03/13 1330 11/04/13 0547 11/05/13 0500 11/06/13 0557 11/08/13 0814  WBC 13.8* 15.6* 11.3* 9.2 8.8  HGB 13.3 13.1 13.0 13.9 12.6*  HCT 38.6* 37.4* 37.7* 40.6 38.1*  MCV 88.7 87.8 88.5 89.4 88.6  PLT 314 340 349 349 301   Cardiac Enzymes: No results found for this basename: CKTOTAL, CKMB, CKMBINDEX, TROPONINI,  in the last 168 hours BNP: No components found with this basename: POCBNP,  CBG:  Recent Labs Lab 11/07/13 2008 11/08/13 0045 11/08/13 0424 11/08/13 0805 11/08/13 1143  GLUCAP 188* 191* 180* 190* 199*    Recent Results (from the past 240 hour(s))  SURGICAL PCR SCREEN     Status: None   Collection Time    11/01/13 12:12 AM      Result Value Ref Range Status   MRSA, PCR NEGATIVE  NEGATIVE Final   Staphylococcus aureus NEGATIVE  NEGATIVE Final   Comment:            The Xpert SA Assay (FDA     approved for NASAL specimens     in patients over 37 years of age),     is one component of     a comprehensive surveillance     program.  Test performance has     been validated by [employer] for patients greater     than or equal to 46 year old.     It is not intended     to diagnose infection nor to     guide or monitor treatment.     Scheduled Meds: . ciprofloxacin  400 mg Intravenous Q12H  . heparin  5,000 Units Subcutaneous 3 times per day  . insulin aspart  0-15 Units Subcutaneous TID WC  . insulin aspart  0-5 Units Subcutaneous QHS  . insulin glargine  5 Units Subcutaneous Daily  .  metronidazole  500 mg Intravenous Q8H  . pantoprazole (PROTONIX) IV  40 mg Intravenous Q24H  . polyethylene glycol  17 g Oral Daily   Continuous Infusions:    Charitie Hinote, DO  [facility] Pager [phone]  If 7PM-7AM,  please contact night-coverage [email or web address] Password [facility] 11/08/2013, 3:16 PM   LOS: 9 days

## 2013-11-09 LAB — BASIC METABOLIC PANEL
Anion gap: 10 (ref 5–15)
BUN: 5 mg/dL — ABNORMAL LOW (ref 6–23)
CO2: 22 mEq/L (ref 19–32)
Calcium: 8.8 mg/dL (ref 8.4–10.5)
Chloride: 110 mEq/L (ref 96–112)
Creatinine, Ser: 0.82 mg/dL (ref 0.50–1.35)
GLUCOSE: 170 mg/dL — AB (ref 70–99)
POTASSIUM: 3.7 meq/L (ref 3.7–5.3)
SODIUM: 142 meq/L (ref 137–147)

## 2013-11-09 LAB — GLUCOSE, CAPILLARY: Glucose-Capillary: 164 mg/dL — ABNORMAL HIGH (ref 70–99)

## 2013-11-09 MED ORDER — METRONIDAZOLE 500 MG PO TABS: 500.0000 mg | ORAL_TABLET | Freq: Three times a day (TID) | ORAL | Status: DC

## 2013-11-09 MED ORDER — CIPROFLOXACIN HCL 500 MG PO TABS
500.0000 mg | ORAL_TABLET | Freq: Two times a day (BID) | ORAL | Status: DC
  Administered 2013-11-09: 500 mg via ORAL
  Filled 2013-11-09 (×3): qty 1

## 2013-11-09 MED ORDER — PANTOPRAZOLE SODIUM 40 MG PO TBEC: 40.0000 mg | DELAYED_RELEASE_TABLET | Freq: Every day | ORAL | Status: DC

## 2013-11-09 MED ORDER — METRONIDAZOLE 500 MG PO TABS
500.0000 mg | ORAL_TABLET | Freq: Three times a day (TID) | ORAL | Status: DC
  Administered 2013-11-09: 500 mg via ORAL
  Filled 2013-11-09 (×4): qty 1

## 2013-11-09 MED ORDER — INSULIN ASPART 100 UNIT/ML ~~LOC~~ SOLN
0.0000 [IU] | Freq: Three times a day (TID) | SUBCUTANEOUS | Status: DC
  Administered 2013-11-09: 2 [IU] via SUBCUTANEOUS

## 2013-11-09 MED ORDER — CIPROFLOXACIN HCL 500 MG PO TABS: 500.0000 mg | ORAL_TABLET | Freq: Two times a day (BID) | ORAL | Status: DC

## 2013-11-09 MED ORDER — INSULIN ASPART PROT & ASPART (70-30 MIX) 100 UNIT/ML ~~LOC~~ SUSP
6.0000 [IU] | Freq: Two times a day (BID) | SUBCUTANEOUS | Status: DC
  Administered 2013-11-09: 6 [IU] via SUBCUTANEOUS
  Filled 2013-11-09: qty 10

## 2013-11-09 MED ORDER — INSULIN LISPRO PROT & LISPRO (75-25 MIX) 100 UNIT/ML KWIKPEN: 6.0000 [IU] | PEN_INJECTOR | Freq: Two times a day (BID) | SUBCUTANEOUS | Status: DC

## 2013-11-09 MED ORDER — UNABLE TO FIND: Status: DC

## 2013-11-09 MED ORDER — INSULIN ASPART 100 UNIT/ML ~~LOC~~ SOLN: 0.0000 [IU] | Freq: Every day | SUBCUTANEOUS | Status: DC

## 2013-11-09 NOTE — Progress Notes (Signed)
[facility] Progress Note  6 Days Post-Op  Subjective: Feels great, going home today.  No questions/concerns.    Objective: Vital signs in last 24 hours: Temp:  [98.5 F (36.9 C)-98.9 F (37.2 C)] 98.5 F (36.9 C) (08/23 0602) Pulse Rate:  [83-89] 83 (08/23 0602) Resp:  [16] 16 (08/23 0602) BP: (137-167)/(71-73) 167/73 mmHg (08/23 0602) SpO2:  [91 %-95 %] 91 % (08/23 0602) Last BM Date: 11/09/13  Intake/Output from previous day: 08/22 0701 - 08/23 0700 In: 700 [IV Piggyback:700] Out: -  Intake/Output this shift:    PE: Gen:  Alert, NAD, pleasant Abd: Soft, NT/ND, +BS, no HSM, incisions C/D/I with staples in place   Lab Results:   Recent Labs  11/08/13 0814  WBC 8.8  HGB 12.6*  HCT 38.1*  PLT 301   BMET  Recent Labs  11/08/13 0814 11/09/13 0540  NA 141 142  K 4.2 3.7  CL 108 110  CO2 19 22  GLUCOSE 194* 170*  BUN 6 5*  CREATININE 0.71 0.82  CALCIUM 8.8 8.8   PT/INR No results found for this basename: LABPROT, INR,  in the last 72 hours CMP     Component Value Date/Time   NA 142 11/09/2013 0540   K 3.7 11/09/2013 0540   CL 110 11/09/2013 0540   CO2 22 11/09/2013 0540   GLUCOSE 170* 11/09/2013 0540   BUN 5* 11/09/2013 0540   CREATININE 0.82 11/09/2013 0540   CALCIUM 8.8 11/09/2013 0540   PROT 5.8* 11/04/2013 0547   ALBUMIN 1.9* 11/04/2013 0547   AST 15 11/04/2013 0547   ALT 10 11/04/2013 0547   ALKPHOS 70 11/04/2013 0547   BILITOT 0.2* 11/04/2013 0547   GFRNONAA >90 11/09/2013 0540   GFRAA >90 11/09/2013 0540   Lipase  No results found for this basename: lipase       Studies/Results: No results found.  Anti-infectives: Anti-infectives   Start     Dose/Rate Route Frequency Ordered Stop   11/09/13 0800  ciprofloxacin (CIPRO) tablet 500 mg     500 mg Oral 2 times daily 11/09/13 0645     11/09/13 0730  metroNIDAZOLE (FLAGYL) tablet 500 mg     500 mg Oral 3 times per day 11/09/13 0645     11/09/13 0000  ciprofloxacin (CIPRO) 500 MG  tablet     500 mg Oral 2 times daily 11/09/13 0704     11/09/13 0000  metroNIDAZOLE (FLAGYL) 500 MG tablet     500 mg Oral Every 8 hours 11/09/13 0704     11/07/13 2045  ciprofloxacin (CIPRO) IVPB 400 mg  Status:  Discontinued     400 mg 200 mL/hr over 60 Minutes Intravenous Every 12 hours 11/07/13 1934 11/09/13 0645   10/31/13 1000  ciprofloxacin (CIPRO) IVPB 400 mg  Status:  Discontinued     400 mg 200 mL/hr over 60 Minutes Intravenous Every 12 hours 10/31/13 0022 11/05/13 0839   10/31/13 0000  metroNIDAZOLE (FLAGYL) IVPB 500 mg  Status:  Discontinued     500 mg 100 mL/hr over 60 Minutes Intravenous Every 8 hours 10/30/13 2252 11/09/13 0645   10/31/13 0000  ciprofloxacin (CIPRO) IVPB 400 mg     400 mg 200 mL/hr over 60 Minutes Intravenous NOW 10/30/13 2255 10/31/13 0111       Assessment/Plan 1. Necrotic mass in the greater omentum which turned out to be an abscess -POD#4 S/p Exploratory laparotomy with partial omentectomy and lysis of adhesions, 11/03/2013, Imogene Burn. Tsuei,  MD  2. AODM type II  3. Hypertension  4. GERD  5. Tobacco use  6. Post op ileus   Plan: -Encouraged IS and ambulation -Going home today with daughter -ABX at d/c (complete 14 day course) -D/c home, f/u with Dr. Vonna Kotyk nurse this week for staple removal, f/u with Dr. Georgette Dover in 2-3 weeks, will likely need to be out of work for 2-3 weeks, he works as a Administrator    LOS: 10 days    DORT, Adrian Cross 11/09/2013, 9:56 AM Pager: [phone]

## 2013-11-09 NOTE — Progress Notes (Signed)
agree

## 2013-11-09 NOTE — Discharge Summary (Signed)
Physician Discharge Summary  Adrian Cross IWP:[id number] DOB: February 08, 1946 DOA: 10/30/2013  PCP: Fanny Bien, MD  Admit date: 10/30/2013 Discharge date: 11/09/2013  Recommendations for Outpatient Follow-up:  1. Pt will need to follow up with PCP in 2 weeks post discharge 2. Please obtain BMP  3. CBC 4. Follow up in 1 week with Dr. Gershon Crane, general surgery  Discharge Diagnoses:  Omental Mass/Abdominal abscess -pathology--no malignancy--> fiber adipose tissue with necrosis c/w abscess  -continue cipro and flagyl x four additional days after discharge which would  complete 14 days of therapy -11/03/13-S/p Exploratory laparotomy with partial omentectomy and lysis of adhesions, 11/03/2013, Dr. Imogene Burn. Tsuei  -11/05/2013 started on clear liquids  -11/08/13--advance to carb modified diet  Postoperative Ileus  -8/20--AXR c/w ileus  -now clinically improving  -Patient had 2 BM this am  -Tolerating full liquids  -11/08/13--advance to carb modified diet-patient tolerated without difficulty - -appreciate surgery followup  -ambulate  -saline lock ivf  Diabetes mellitus type 2  -Hold glyburide for now--patient stated that he was no longer taking this at home  -Patient states that he was taking insulin at home for the last several months, but he does not know the dose.he was getting samples from his primary care physician  -Hemoglobin A1c-11.9  -Continue Lantus and NovoLog sliding scale During the hospitalization   -Further investigation,revealed that the patient has been receiving 75/25 pen at home 10 units at bedtime -Based upon the patient's CBG checks during the hospitalization, the patient will be sent home with 75/25 insulin 6 units with breakfast and 6 units with dinner Hypertension  -restart lisinopril  -Although the patient's antihypertensive regimen was held for most of the hospitalization, the patient's blood pressure began to increase prior to discharge. His lisinopril will be  restarted.  Tobacco abuse  -Tobacco cessation discussed  Hypokalemia  -Repleted    Discharge Condition:  Stable  Disposition:  Follow-up Information   Follow up with TSUEI,MATTHEW K., MD In 1 week.   Specialty:  General Surgery   Contact information:   [address]  [place] [address] [phone]       Follow up with HILL,CHARLES, MD In 2 weeks.   Specialty:  Obstetrics and Gynecology    Home   Diet: Carbohydrate modified  Wt Readings from Last 3 Encounters:  10/30/13 70.6 kg (155 lb 10.3 oz)  10/30/13 70.6 kg (155 lb 10.3 oz)    History of present illness:  68 y.o. male with history of diabetes mellitus and hypertension had presented to the ER at [facility] with complaints of abdominal pain. Patient has been experiencing abdominal pain over the past one month, but it had acutely worsened 24 hours prior to admission.the patient had associated nausea and vomiting with  subjective feeling of fever and chills. Pain most recent left lower quadrant radiating across the abdomen to the right lower quadrant.  CT abdomen pelvis done showed left omental mass with necrosis and patient was transferred to [facility] for further management.Patient's last bowel movement was 2 days ago. Denies any chest pain or shortness of breath. Patient is mildly febrile. Labs done at [facility] were largely unremarkable except for mild leukocytosis. Patient states he has had colonoscopy last year which was unremarkable and as per the patient. General surgery was consulted. The patient underwent exploratory laparotomy with resection of the omental mass on 11/03/2013. Postoperatively, the patient suffered an ileus. He was treated conservatively with fluids and bowel rest. The patient improved without  any further surgical intervention. His diet was gradually advanced which he tolerated. The patient was empirically started on antibiotics given prior to surgery. Antibiotics were  continued through the postoperative period. He will have 4 more days of antibiotics after discharge.    Consultants: General surgery  Discharge Exam: Filed Vitals:   11/09/13 0602  BP: 167/73  Pulse: 83  Temp: 98.5 F (36.9 C)  Resp: 16   Filed Vitals:   11/07/13 2015 11/08/13 0512 11/08/13 2203 11/09/13 0602  BP: 157/83 150/73 137/71 167/73  Pulse: 94 88 89 83  Temp: 99.1 F (37.3 C) 98.3 F (36.8 C) 98.9 F (37.2 C) 98.5 F (36.9 C)  TempSrc: Oral Oral Oral Oral  Resp: 17 16 16 16   Height:      Weight:      SpO2: 97% 97% 95% 91%   General: A&O x 3, NAD, pleasant, cooperative Cardiovascular: RRR, no rub, no gallop, no S3 Respiratory: CTAB, no wheeze, no rhonchi Abdomen:soft, nontender, nondistended, positive bowel sounds Extremities: No edema, No lymphangitis, no petechiae  Discharge Instructions      Discharge Instructions   Diet Carb Modified    Complete by:  As directed      Increase activity slowly    Complete by:  As directed             Medication List    STOP taking these medications       glyBURIDE 5 MG tablet  Commonly known as:  DIABETA      TAKE these medications       ciprofloxacin 500 MG tablet  Commonly known as:  CIPRO  Take 1 tablet (500 mg total) by mouth 2 (two) times daily.     Insulin Lispro Prot & Lispro (75-25) 100 UNIT/ML Kwikpen  Commonly known as:  HUMALOG MIX 75/25 KWIKPEN  Inject 6 Units into the skin 2 (two) times daily with a meal. With breakfast and supper     lisinopril 40 MG tablet  Commonly known as:  PRINIVIL,ZESTRIL  Take 40 mg by mouth daily.     metroNIDAZOLE 500 MG tablet  Commonly known as:  FLAGYL  Take 1 tablet (500 mg total) by mouth every 8 (eight) hours.         The results of significant diagnostics from this hospitalization (including imaging, microbiology, ancillary and laboratory) are listed below for reference.    Significant Diagnostic Studies: Dg Chest 2 View  11/01/2013   CLINICAL  DATA:  Preop for abdominal mass  EXAM: CHEST  2 VIEW  COMPARISON:  CT 10/30/2013  FINDINGS: Normal cardiac silhouette. There is linear markings at the lung bases corresponds linear atelectasis on comparison CT. Upper lungs are clear. No pleural fluid.  IMPRESSION: 1. No interval change from recent CT. 2. Bibasilar atelectasis.   Electronically Signed   By: Suzy Bouchard M.D.   On: 11/01/2013 15:58   Dg Abd 2 Views  11/06/2013   CLINICAL DATA:  Postoperative ileus; constipation  EXAM: ABDOMEN - 2 VIEW  COMPARISON:  CT abdomen and pelvis October 30, 2013  FINDINGS: Supine and upright images were obtained. There are multiple skin staples. There is generalized bowel dilatation with scattered air-fluid levels. Contrast is noted in the colon, and there is a small amount of air in the rectum. No free air.  IMPRESSION: The appearance is consistent with postoperative ileus. A degree of partial obstruction cannot be entirely excluded, however. No free air.   Electronically Signed   By: Gwyndolyn Saxon  Jasmine December M.D.   On: 11/06/2013 11:47     Microbiology: Recent Results (from the past 240 hour(s))  SURGICAL PCR SCREEN     Status: None   Collection Time    11/01/13 12:12 AM      Result Value Ref Range Status   MRSA, PCR NEGATIVE  NEGATIVE Final   Staphylococcus aureus NEGATIVE  NEGATIVE Final   Comment:            The Xpert SA Assay (FDA     approved for NASAL specimens     in patients over 68 years of age),     is one component of     a comprehensive surveillance     program.  Test performance has     been validated by [employer] for patients greater     than or equal to 63 year old.     It is not intended     to diagnose infection nor to     guide or monitor treatment.     Labs: Basic Metabolic Panel:  Recent Labs Lab 11/03/13 0550  11/05/13 0500 11/06/13 0557 11/07/13 0625 11/08/13 0814 11/09/13 0540  NA 137  < > 141 138 143 141 142  K 3.6*  < > 4.0 4.3 4.7 4.2 3.7  CL 103  < > 106  105 108 108 110  CO2 23  < > 22 21 24 19 22   GLUCOSE 135*  < > 173* 191* 136* 194* 170*  BUN 11  < > 11 11 10 6  5*  CREATININE 0.90  < > 0.79 0.88 0.86 0.71 0.82  CALCIUM 9.2  < > 9.5 9.6 9.2 8.8 8.8  MG 1.9  --  1.9  --   --   --   --   < > = values in this interval not displayed. Liver Function Tests:  Recent Labs Lab 11/03/13 0550 11/04/13 0547  AST 11 15  ALT 8 10  ALKPHOS 79 70  BILITOT 0.3 0.2*  PROT 5.7* 5.8*  ALBUMIN 1.9* 1.9*   No results found for this basename: LIPASE, AMYLASE,  in the last 168 hours No results found for this basename: AMMONIA,  in the last 168 hours CBC:  Recent Labs Lab 11/03/13 1330 11/04/13 0547 11/05/13 0500 11/06/13 0557 11/08/13 0814  WBC 13.8* 15.6* 11.3* 9.2 8.8  HGB 13.3 13.1 13.0 13.9 12.6*  HCT 38.6* 37.4* 37.7* 40.6 38.1*  MCV 88.7 87.8 88.5 89.4 88.6  PLT 314 340 349 349 301   Cardiac Enzymes: No results found for this basename: CKTOTAL, CKMB, CKMBINDEX, TROPONINI,  in the last 168 hours BNP: No components found with this basename: POCBNP,  CBG:  Recent Labs Lab 11/08/13 0424 11/08/13 0805 11/08/13 1143 11/08/13 1610 11/08/13 2158  GLUCAP 180* 190* 199* 179* 153*    Time coordinating discharge:  Greater than 30 minutes  Signed:  Sumie Remsen, DO [facility] Pager: [phone] 11/09/2013, 7:16 AM

## 2013-11-09 NOTE — Progress Notes (Signed)
11/09/13 Patient being discharged home today, IV site removed. Discharge instructions reviewed with patient.

## 2013-11-10 ENCOUNTER — Telehealth (INDEPENDENT_AMBULATORY_CARE_PROVIDER_SITE_OTHER): Payer: Self-pay

## 2013-11-10 NOTE — Telephone Encounter (Signed)
LM on sister's VM.  Pt has appt for staple removal on 11/12/13 at 10:00 a.m.  Per [employer].

## 2013-11-12 ENCOUNTER — Encounter (INDEPENDENT_AMBULATORY_CARE_PROVIDER_SITE_OTHER): Payer: Medicare Other

## 2013-11-21 ENCOUNTER — Encounter (INDEPENDENT_AMBULATORY_CARE_PROVIDER_SITE_OTHER): Payer: Medicare Other | Admitting: Surgery

## 2014-06-10 DIAGNOSIS — I10 Essential (primary) hypertension: Secondary | ICD-10-CM | POA: Diagnosis not present

## 2014-06-10 DIAGNOSIS — E1142 Type 2 diabetes mellitus with diabetic polyneuropathy: Secondary | ICD-10-CM | POA: Diagnosis not present

## 2014-06-10 DIAGNOSIS — N529 Male erectile dysfunction, unspecified: Secondary | ICD-10-CM | POA: Diagnosis not present

## 2014-09-14 DIAGNOSIS — I10 Essential (primary) hypertension: Secondary | ICD-10-CM | POA: Diagnosis not present

## 2014-09-14 DIAGNOSIS — N529 Male erectile dysfunction, unspecified: Secondary | ICD-10-CM | POA: Diagnosis not present

## 2014-09-14 DIAGNOSIS — E1142 Type 2 diabetes mellitus with diabetic polyneuropathy: Secondary | ICD-10-CM | POA: Diagnosis not present

## 2014-10-02 DIAGNOSIS — H524 Presbyopia: Secondary | ICD-10-CM | POA: Diagnosis not present

## 2014-10-02 DIAGNOSIS — H5210 Myopia, unspecified eye: Secondary | ICD-10-CM | POA: Diagnosis not present

## 2014-10-02 DIAGNOSIS — E109 Type 1 diabetes mellitus without complications: Secondary | ICD-10-CM | POA: Diagnosis not present

## 2014-10-02 DIAGNOSIS — H52223 Regular astigmatism, bilateral: Secondary | ICD-10-CM | POA: Diagnosis not present

## 2014-10-02 DIAGNOSIS — E11329 Type 2 diabetes mellitus with mild nonproliferative diabetic retinopathy without macular edema: Secondary | ICD-10-CM | POA: Diagnosis not present

## 2014-10-02 DIAGNOSIS — H5201 Hypermetropia, right eye: Secondary | ICD-10-CM | POA: Diagnosis not present

## 2014-12-16 DIAGNOSIS — I10 Essential (primary) hypertension: Secondary | ICD-10-CM | POA: Diagnosis not present

## 2014-12-16 DIAGNOSIS — E1142 Type 2 diabetes mellitus with diabetic polyneuropathy: Secondary | ICD-10-CM | POA: Diagnosis not present

## 2015-03-08 DIAGNOSIS — I10 Essential (primary) hypertension: Secondary | ICD-10-CM | POA: Diagnosis not present

## 2015-03-08 DIAGNOSIS — E1165 Type 2 diabetes mellitus with hyperglycemia: Secondary | ICD-10-CM | POA: Diagnosis not present

## 2015-04-03 DIAGNOSIS — E119 Type 2 diabetes mellitus without complications: Secondary | ICD-10-CM | POA: Diagnosis not present

## 2015-04-03 DIAGNOSIS — Z6827 Body mass index (BMI) 27.0-27.9, adult: Secondary | ICD-10-CM | POA: Diagnosis not present

## 2015-04-03 DIAGNOSIS — I10 Essential (primary) hypertension: Secondary | ICD-10-CM | POA: Diagnosis not present

## 2015-07-12 DIAGNOSIS — E1142 Type 2 diabetes mellitus with diabetic polyneuropathy: Secondary | ICD-10-CM | POA: Diagnosis not present

## 2015-07-12 DIAGNOSIS — F5221 Male erectile disorder: Secondary | ICD-10-CM | POA: Diagnosis not present

## 2015-07-12 DIAGNOSIS — I10 Essential (primary) hypertension: Secondary | ICD-10-CM | POA: Diagnosis not present

## 2015-08-03 DIAGNOSIS — R21 Rash and other nonspecific skin eruption: Secondary | ICD-10-CM | POA: Diagnosis not present

## 2015-08-03 DIAGNOSIS — L0390 Cellulitis, unspecified: Secondary | ICD-10-CM | POA: Diagnosis not present

## 2015-08-03 DIAGNOSIS — W57XXXA Bitten or stung by nonvenomous insect and other nonvenomous arthropods, initial encounter: Secondary | ICD-10-CM | POA: Diagnosis not present

## 2015-09-13 DIAGNOSIS — N529 Male erectile dysfunction, unspecified: Secondary | ICD-10-CM | POA: Diagnosis not present

## 2015-09-13 DIAGNOSIS — I10 Essential (primary) hypertension: Secondary | ICD-10-CM | POA: Diagnosis not present

## 2015-09-13 DIAGNOSIS — E1165 Type 2 diabetes mellitus with hyperglycemia: Secondary | ICD-10-CM | POA: Diagnosis not present

## 2015-12-21 DIAGNOSIS — E109 Type 1 diabetes mellitus without complications: Secondary | ICD-10-CM | POA: Diagnosis not present

## 2015-12-21 DIAGNOSIS — H524 Presbyopia: Secondary | ICD-10-CM | POA: Diagnosis not present

## 2015-12-22 DIAGNOSIS — E118 Type 2 diabetes mellitus with unspecified complications: Secondary | ICD-10-CM | POA: Diagnosis not present

## 2015-12-22 DIAGNOSIS — E119 Type 2 diabetes mellitus without complications: Secondary | ICD-10-CM | POA: Diagnosis not present

## 2015-12-22 DIAGNOSIS — Z6827 Body mass index (BMI) 27.0-27.9, adult: Secondary | ICD-10-CM | POA: Diagnosis not present

## 2015-12-22 DIAGNOSIS — I10 Essential (primary) hypertension: Secondary | ICD-10-CM | POA: Diagnosis not present

## 2016-04-26 DIAGNOSIS — I10 Essential (primary) hypertension: Secondary | ICD-10-CM | POA: Diagnosis not present

## 2016-04-26 DIAGNOSIS — E1165 Type 2 diabetes mellitus with hyperglycemia: Secondary | ICD-10-CM | POA: Diagnosis not present

## 2016-06-20 DIAGNOSIS — L309 Dermatitis, unspecified: Secondary | ICD-10-CM | POA: Diagnosis not present

## 2016-06-27 ENCOUNTER — Ambulatory Visit: Payer: Medicare Other | Admitting: Podiatry

## 2016-07-22 DIAGNOSIS — I10 Essential (primary) hypertension: Secondary | ICD-10-CM | POA: Diagnosis not present

## 2016-07-22 DIAGNOSIS — E118 Type 2 diabetes mellitus with unspecified complications: Secondary | ICD-10-CM | POA: Diagnosis not present

## 2016-07-22 DIAGNOSIS — S30860A Insect bite (nonvenomous) of lower back and pelvis, initial encounter: Secondary | ICD-10-CM | POA: Diagnosis not present

## 2016-08-11 DIAGNOSIS — E119 Type 2 diabetes mellitus without complications: Secondary | ICD-10-CM | POA: Diagnosis not present

## 2016-08-11 DIAGNOSIS — S93492A Sprain of other ligament of left ankle, initial encounter: Secondary | ICD-10-CM | POA: Diagnosis not present

## 2016-08-11 DIAGNOSIS — Z7984 Long term (current) use of oral hypoglycemic drugs: Secondary | ICD-10-CM | POA: Diagnosis not present

## 2016-08-11 DIAGNOSIS — X501XXA Overexertion from prolonged static or awkward postures, initial encounter: Secondary | ICD-10-CM | POA: Diagnosis not present

## 2016-08-11 DIAGNOSIS — F17200 Nicotine dependence, unspecified, uncomplicated: Secondary | ICD-10-CM | POA: Diagnosis not present

## 2016-08-11 DIAGNOSIS — Z79899 Other long term (current) drug therapy: Secondary | ICD-10-CM | POA: Diagnosis not present

## 2016-08-11 DIAGNOSIS — M25572 Pain in left ankle and joints of left foot: Secondary | ICD-10-CM | POA: Diagnosis not present

## 2016-09-19 DIAGNOSIS — I10 Essential (primary) hypertension: Secondary | ICD-10-CM | POA: Diagnosis not present

## 2016-09-19 DIAGNOSIS — E1165 Type 2 diabetes mellitus with hyperglycemia: Secondary | ICD-10-CM | POA: Diagnosis not present

## 2016-09-19 DIAGNOSIS — K5900 Constipation, unspecified: Secondary | ICD-10-CM | POA: Diagnosis not present

## 2016-11-23 DIAGNOSIS — S199XXA Unspecified injury of neck, initial encounter: Secondary | ICD-10-CM | POA: Diagnosis not present

## 2016-11-23 DIAGNOSIS — W010XXA Fall on same level from slipping, tripping and stumbling without subsequent striking against object, initial encounter: Secondary | ICD-10-CM | POA: Diagnosis not present

## 2016-11-23 DIAGNOSIS — M25511 Pain in right shoulder: Secondary | ICD-10-CM | POA: Diagnosis not present

## 2016-11-23 DIAGNOSIS — M6281 Muscle weakness (generalized): Secondary | ICD-10-CM | POA: Diagnosis not present

## 2016-11-23 DIAGNOSIS — R531 Weakness: Secondary | ICD-10-CM | POA: Diagnosis not present

## 2016-11-23 DIAGNOSIS — E1165 Type 2 diabetes mellitus with hyperglycemia: Secondary | ICD-10-CM | POA: Diagnosis not present

## 2016-11-23 DIAGNOSIS — I10 Essential (primary) hypertension: Secondary | ICD-10-CM | POA: Diagnosis not present

## 2016-11-23 DIAGNOSIS — M6282 Rhabdomyolysis: Secondary | ICD-10-CM | POA: Diagnosis not present

## 2016-11-23 DIAGNOSIS — S0990XA Unspecified injury of head, initial encounter: Secondary | ICD-10-CM | POA: Diagnosis not present

## 2016-11-23 DIAGNOSIS — T602X1A Toxic effect of other insecticides, accidental (unintentional), initial encounter: Secondary | ICD-10-CM | POA: Diagnosis not present

## 2016-11-23 DIAGNOSIS — T148XXA Other injury of unspecified body region, initial encounter: Secondary | ICD-10-CM | POA: Diagnosis not present

## 2016-11-23 DIAGNOSIS — N179 Acute kidney failure, unspecified: Secondary | ICD-10-CM | POA: Diagnosis not present

## 2016-11-23 DIAGNOSIS — E860 Dehydration: Secondary | ICD-10-CM | POA: Diagnosis not present

## 2016-11-23 DIAGNOSIS — T304 Corrosion of unspecified body region, unspecified degree: Secondary | ICD-10-CM | POA: Diagnosis not present

## 2016-11-23 DIAGNOSIS — I959 Hypotension, unspecified: Secondary | ICD-10-CM | POA: Diagnosis not present

## 2016-11-23 DIAGNOSIS — R061 Stridor: Secondary | ICD-10-CM | POA: Diagnosis not present

## 2016-11-24 DIAGNOSIS — W19XXXA Unspecified fall, initial encounter: Secondary | ICD-10-CM | POA: Diagnosis not present

## 2016-11-24 DIAGNOSIS — R531 Weakness: Secondary | ICD-10-CM | POA: Diagnosis not present

## 2016-11-24 DIAGNOSIS — S14109A Unspecified injury at unspecified level of cervical spinal cord, initial encounter: Secondary | ICD-10-CM | POA: Diagnosis not present

## 2016-11-24 DIAGNOSIS — M25511 Pain in right shoulder: Secondary | ICD-10-CM | POA: Diagnosis not present

## 2016-11-25 DIAGNOSIS — E119 Type 2 diabetes mellitus without complications: Secondary | ICD-10-CM | POA: Diagnosis not present

## 2016-11-25 DIAGNOSIS — S14129A Central cord syndrome at unspecified level of cervical spinal cord, initial encounter: Secondary | ICD-10-CM | POA: Diagnosis not present

## 2016-11-25 DIAGNOSIS — T6591XA Toxic effect of unspecified substance, accidental (unintentional), initial encounter: Secondary | ICD-10-CM | POA: Diagnosis not present

## 2016-11-26 DIAGNOSIS — E119 Type 2 diabetes mellitus without complications: Secondary | ICD-10-CM | POA: Diagnosis not present

## 2016-11-26 DIAGNOSIS — T6591XA Toxic effect of unspecified substance, accidental (unintentional), initial encounter: Secondary | ICD-10-CM | POA: Diagnosis not present

## 2016-11-26 DIAGNOSIS — S14129A Central cord syndrome at unspecified level of cervical spinal cord, initial encounter: Secondary | ICD-10-CM | POA: Diagnosis not present

## 2016-11-27 DIAGNOSIS — S14129A Central cord syndrome at unspecified level of cervical spinal cord, initial encounter: Secondary | ICD-10-CM | POA: Diagnosis not present

## 2016-11-27 DIAGNOSIS — T6591XA Toxic effect of unspecified substance, accidental (unintentional), initial encounter: Secondary | ICD-10-CM | POA: Diagnosis not present

## 2016-11-27 DIAGNOSIS — E119 Type 2 diabetes mellitus without complications: Secondary | ICD-10-CM | POA: Diagnosis not present

## 2016-11-28 DIAGNOSIS — K269 Duodenal ulcer, unspecified as acute or chronic, without hemorrhage or perforation: Secondary | ICD-10-CM | POA: Diagnosis not present

## 2016-11-28 DIAGNOSIS — R933 Abnormal findings on diagnostic imaging of other parts of digestive tract: Secondary | ICD-10-CM | POA: Diagnosis not present

## 2016-11-28 DIAGNOSIS — Z833 Family history of diabetes mellitus: Secondary | ICD-10-CM | POA: Diagnosis not present

## 2016-11-28 DIAGNOSIS — T148XXA Other injury of unspecified body region, initial encounter: Secondary | ICD-10-CM | POA: Diagnosis not present

## 2016-11-28 DIAGNOSIS — M6281 Muscle weakness (generalized): Secondary | ICD-10-CM | POA: Diagnosis not present

## 2016-11-28 DIAGNOSIS — K2980 Duodenitis without bleeding: Secondary | ICD-10-CM | POA: Diagnosis not present

## 2016-11-28 DIAGNOSIS — I10 Essential (primary) hypertension: Secondary | ICD-10-CM | POA: Diagnosis not present

## 2016-11-28 DIAGNOSIS — Z77098 Contact with and (suspected) exposure to other hazardous, chiefly nonmedicinal, chemicals: Secondary | ICD-10-CM | POA: Diagnosis not present

## 2016-11-28 DIAGNOSIS — K529 Noninfective gastroenteritis and colitis, unspecified: Secondary | ICD-10-CM | POA: Diagnosis not present

## 2016-11-28 DIAGNOSIS — N179 Acute kidney failure, unspecified: Secondary | ICD-10-CM | POA: Diagnosis not present

## 2016-11-28 DIAGNOSIS — M6282 Rhabdomyolysis: Secondary | ICD-10-CM | POA: Diagnosis not present

## 2016-11-28 DIAGNOSIS — R112 Nausea with vomiting, unspecified: Secondary | ICD-10-CM | POA: Diagnosis not present

## 2016-11-28 DIAGNOSIS — T300 Burn of unspecified body region, unspecified degree: Secondary | ICD-10-CM | POA: Diagnosis not present

## 2016-11-28 DIAGNOSIS — F17210 Nicotine dependence, cigarettes, uncomplicated: Secondary | ICD-10-CM | POA: Diagnosis not present

## 2016-11-28 DIAGNOSIS — K449 Diaphragmatic hernia without obstruction or gangrene: Secondary | ICD-10-CM | POA: Diagnosis not present

## 2016-11-28 DIAGNOSIS — W1789XA Other fall from one level to another, initial encounter: Secondary | ICD-10-CM | POA: Diagnosis present

## 2016-11-28 DIAGNOSIS — M25511 Pain in right shoulder: Secondary | ICD-10-CM | POA: Diagnosis not present

## 2016-11-28 DIAGNOSIS — E860 Dehydration: Secondary | ICD-10-CM | POA: Diagnosis not present

## 2016-11-28 DIAGNOSIS — M4312 Spondylolisthesis, cervical region: Secondary | ICD-10-CM | POA: Diagnosis not present

## 2016-11-28 DIAGNOSIS — T304 Corrosion of unspecified body region, unspecified degree: Secondary | ICD-10-CM | POA: Diagnosis not present

## 2016-11-28 DIAGNOSIS — R1013 Epigastric pain: Secondary | ICD-10-CM | POA: Diagnosis not present

## 2016-11-28 DIAGNOSIS — Z794 Long term (current) use of insulin: Secondary | ICD-10-CM | POA: Diagnosis not present

## 2016-11-28 DIAGNOSIS — Z743 Need for continuous supervision: Secondary | ICD-10-CM | POA: Diagnosis not present

## 2016-11-28 DIAGNOSIS — K210 Gastro-esophageal reflux disease with esophagitis: Secondary | ICD-10-CM | POA: Diagnosis not present

## 2016-11-28 DIAGNOSIS — S60521A Blister (nonthermal) of right hand, initial encounter: Secondary | ICD-10-CM | POA: Diagnosis not present

## 2016-11-28 DIAGNOSIS — R279 Unspecified lack of coordination: Secondary | ICD-10-CM | POA: Diagnosis not present

## 2016-11-28 DIAGNOSIS — K2210 Ulcer of esophagus without bleeding: Secondary | ICD-10-CM | POA: Diagnosis not present

## 2016-11-28 DIAGNOSIS — Z79899 Other long term (current) drug therapy: Secondary | ICD-10-CM | POA: Diagnosis not present

## 2016-11-28 DIAGNOSIS — E1141 Type 2 diabetes mellitus with diabetic mononeuropathy: Secondary | ICD-10-CM | POA: Diagnosis not present

## 2016-11-28 DIAGNOSIS — R109 Unspecified abdominal pain: Secondary | ICD-10-CM | POA: Diagnosis not present

## 2016-11-28 DIAGNOSIS — S14109D Unspecified injury at unspecified level of cervical spinal cord, subsequent encounter: Secondary | ICD-10-CM | POA: Diagnosis not present

## 2016-11-28 DIAGNOSIS — T602X1A Toxic effect of other insecticides, accidental (unintentional), initial encounter: Secondary | ICD-10-CM | POA: Diagnosis not present

## 2016-11-28 DIAGNOSIS — E1165 Type 2 diabetes mellitus with hyperglycemia: Secondary | ICD-10-CM | POA: Diagnosis not present

## 2016-11-29 ENCOUNTER — Inpatient Hospital Stay (HOSPITAL_COMMUNITY)
Admission: EM | Admit: 2016-11-29 | Discharge: 2016-12-01 | DRG: 392 | Disposition: A | Discharge status: Skilled Nursing Facility | Payer: Medicare HMO | Attending: Internal Medicine | Admitting: Internal Medicine

## 2016-11-29 ENCOUNTER — Encounter (HOSPITAL_COMMUNITY): Payer: Self-pay | Admitting: Emergency Medicine

## 2016-11-29 ENCOUNTER — Emergency Department (HOSPITAL_COMMUNITY): Payer: Medicare HMO

## 2016-11-29 DIAGNOSIS — F17210 Nicotine dependence, cigarettes, uncomplicated: Secondary | ICD-10-CM | POA: Diagnosis not present

## 2016-11-29 DIAGNOSIS — K269 Duodenal ulcer, unspecified as acute or chronic, without hemorrhage or perforation: Secondary | ICD-10-CM | POA: Diagnosis present

## 2016-11-29 DIAGNOSIS — K2210 Ulcer of esophagus without bleeding: Secondary | ICD-10-CM | POA: Diagnosis not present

## 2016-11-29 DIAGNOSIS — W19XXXD Unspecified fall, subsequent encounter: Secondary | ICD-10-CM | POA: Diagnosis not present

## 2016-11-29 DIAGNOSIS — R933 Abnormal findings on diagnostic imaging of other parts of digestive tract: Secondary | ICD-10-CM | POA: Diagnosis not present

## 2016-11-29 DIAGNOSIS — K529 Noninfective gastroenteritis and colitis, unspecified: Secondary | ICD-10-CM | POA: Diagnosis not present

## 2016-11-29 DIAGNOSIS — R109 Unspecified abdominal pain: Secondary | ICD-10-CM | POA: Diagnosis not present

## 2016-11-29 DIAGNOSIS — I10 Essential (primary) hypertension: Secondary | ICD-10-CM | POA: Diagnosis present

## 2016-11-29 DIAGNOSIS — I714 Abdominal aortic aneurysm, without rupture: Secondary | ICD-10-CM | POA: Diagnosis not present

## 2016-11-29 DIAGNOSIS — M6281 Muscle weakness (generalized): Secondary | ICD-10-CM | POA: Diagnosis not present

## 2016-11-29 DIAGNOSIS — S3992XA Unspecified injury of lower back, initial encounter: Secondary | ICD-10-CM | POA: Diagnosis not present

## 2016-11-29 DIAGNOSIS — G959 Disease of spinal cord, unspecified: Secondary | ICD-10-CM

## 2016-11-29 DIAGNOSIS — K210 Gastro-esophageal reflux disease with esophagitis: Secondary | ICD-10-CM | POA: Diagnosis not present

## 2016-11-29 DIAGNOSIS — S60521A Blister (nonthermal) of right hand, initial encounter: Secondary | ICD-10-CM | POA: Diagnosis present

## 2016-11-29 DIAGNOSIS — E1140 Type 2 diabetes mellitus with diabetic neuropathy, unspecified: Secondary | ICD-10-CM | POA: Diagnosis not present

## 2016-11-29 DIAGNOSIS — K449 Diaphragmatic hernia without obstruction or gangrene: Secondary | ICD-10-CM | POA: Diagnosis present

## 2016-11-29 DIAGNOSIS — K2980 Duodenitis without bleeding: Secondary | ICD-10-CM | POA: Diagnosis not present

## 2016-11-29 DIAGNOSIS — R112 Nausea with vomiting, unspecified: Secondary | ICD-10-CM | POA: Diagnosis not present

## 2016-11-29 DIAGNOSIS — E1141 Type 2 diabetes mellitus with diabetic mononeuropathy: Secondary | ICD-10-CM | POA: Diagnosis not present

## 2016-11-29 DIAGNOSIS — R2681 Unsteadiness on feet: Secondary | ICD-10-CM | POA: Diagnosis not present

## 2016-11-29 DIAGNOSIS — A0839 Other viral enteritis: Secondary | ICD-10-CM | POA: Diagnosis not present

## 2016-11-29 DIAGNOSIS — R278 Other lack of coordination: Secondary | ICD-10-CM | POA: Diagnosis not present

## 2016-11-29 DIAGNOSIS — M4712 Other spondylosis with myelopathy, cervical region: Secondary | ICD-10-CM | POA: Diagnosis not present

## 2016-11-29 DIAGNOSIS — Z79899 Other long term (current) drug therapy: Secondary | ICD-10-CM | POA: Diagnosis not present

## 2016-11-29 DIAGNOSIS — W1789XA Other fall from one level to another, initial encounter: Secondary | ICD-10-CM | POA: Diagnosis present

## 2016-11-29 DIAGNOSIS — Z794 Long term (current) use of insulin: Secondary | ICD-10-CM

## 2016-11-29 DIAGNOSIS — T23039A Burn of unspecified degree of unspecified multiple fingers (nail), not including thumb, initial encounter: Secondary | ICD-10-CM | POA: Diagnosis not present

## 2016-11-29 DIAGNOSIS — R1013 Epigastric pain: Secondary | ICD-10-CM | POA: Diagnosis not present

## 2016-11-29 DIAGNOSIS — Z833 Family history of diabetes mellitus: Secondary | ICD-10-CM | POA: Diagnosis not present

## 2016-11-29 DIAGNOSIS — S199XXA Unspecified injury of neck, initial encounter: Secondary | ICD-10-CM | POA: Diagnosis not present

## 2016-11-29 DIAGNOSIS — Z7401 Bed confinement status: Secondary | ICD-10-CM | POA: Diagnosis not present

## 2016-11-29 DIAGNOSIS — S299XXA Unspecified injury of thorax, initial encounter: Secondary | ICD-10-CM | POA: Diagnosis not present

## 2016-11-29 DIAGNOSIS — R279 Unspecified lack of coordination: Secondary | ICD-10-CM | POA: Diagnosis not present

## 2016-11-29 HISTORY — DX: Other specified disorders of peritoneum: K66.8

## 2016-11-29 LAB — URINALYSIS, ROUTINE W REFLEX MICROSCOPIC
Bacteria, UA: NONE SEEN
Bilirubin Urine: NEGATIVE
Glucose, UA: >=500 mg/dL — AB
Hgb urine dipstick: NEGATIVE
Ketones, ur: 20 mg/dL — AB
Leukocytes, UA: NEGATIVE
Nitrite: NEGATIVE
PH: 8 (ref 5.0–8.0)
Protein, ur: NEGATIVE mg/dL
SPECIFIC GRAVITY, URINE: 1.023 (ref 1.005–1.030)
SQUAMOUS EPITHELIAL / LPF: NONE SEEN

## 2016-11-29 LAB — COMPREHENSIVE METABOLIC PANEL
ALBUMIN: 3.4 g/dL — AB (ref 3.5–5.0)
ALT: 37 U/L (ref 17–63)
AST: 21 U/L (ref 15–41)
Alkaline Phosphatase: 72 U/L (ref 38–126)
Anion gap: 10 (ref 5–15)
BUN: 20 mg/dL (ref 6–20)
CHLORIDE: 96 mmol/L — AB (ref 101–111)
CO2: 28 mmol/L (ref 22–32)
CREATININE: 1.16 mg/dL (ref 0.61–1.24)
Calcium: 9.6 mg/dL (ref 8.9–10.3)
GFR calc Af Amer: >60 mL/min (ref >60)
GFR calc non Af Amer: >60 mL/min (ref >60)
Glucose, Bld: 223 mg/dL — ABNORMAL HIGH (ref 65–99)
Potassium: 3.7 mmol/L (ref 3.5–5.1)
SODIUM: 134 mmol/L — AB (ref 135–145)
Total Bilirubin: 0.6 mg/dL (ref 0.3–1.2)
Total Protein: 6.9 g/dL (ref 6.5–8.1)

## 2016-11-29 LAB — CBC WITH DIFFERENTIAL/PLATELET
Basophils Absolute: 0 10*3/uL (ref 0.0–0.1)
Basophils Relative: 0 %
EOS ABS: 0 10*3/uL (ref 0.0–0.7)
Eosinophils Relative: 0 %
HCT: 51.1 % (ref 39.0–52.0)
Hemoglobin: 18.2 g/dL — ABNORMAL HIGH (ref 13.0–17.0)
LYMPHS ABS: 2.9 10*3/uL (ref 0.7–4.0)
Lymphocytes Relative: 24 %
MCH: 31.5 pg (ref 26.0–34.0)
MCHC: 35.6 g/dL (ref 30.0–36.0)
MCV: 88.4 fL (ref 78.0–100.0)
Monocytes Absolute: 0.7 10*3/uL (ref 0.1–1.0)
Monocytes Relative: 6 %
Neutro Abs: 8.3 10*3/uL — ABNORMAL HIGH (ref 1.7–7.7)
Neutrophils Relative %: 70 %
Platelets: 166 10*3/uL (ref 150–400)
RBC: 5.78 MIL/uL (ref 4.22–5.81)
RDW: 13.2 % (ref 11.5–15.5)
WBC: 11.9 10*3/uL — ABNORMAL HIGH (ref 4.0–10.5)

## 2016-11-29 LAB — GLUCOSE, CAPILLARY
GLUCOSE-CAPILLARY: 200 mg/dL — AB (ref 65–99)
Glucose-Capillary: 164 mg/dL — ABNORMAL HIGH (ref 65–99)

## 2016-11-29 LAB — LIPASE, BLOOD: Lipase: 28 U/L (ref 11–51)

## 2016-11-29 LAB — HEMOGLOBIN A1C
HEMOGLOBIN A1C: 10.1 % — AB (ref 4.8–5.6)
MEAN PLASMA GLUCOSE: 243.17 mg/dL

## 2016-11-29 LAB — I-STAT CG4 LACTIC ACID, ED: Lactic Acid, Venous: 2.19 mmol/L (ref 0.5–1.9)

## 2016-11-29 LAB — TROPONIN I

## 2016-11-29 LAB — MRSA PCR SCREENING: MRSA BY PCR: NEGATIVE

## 2016-11-29 LAB — AMYLASE: Amylase: 85 U/L (ref 28–100)

## 2016-11-29 MED ORDER — ONDANSETRON HCL 4 MG/2ML IJ SOLN
4.0000 mg | INTRAMUSCULAR | Status: AC | PRN
  Administered 2016-11-29 (×2): 4 mg via INTRAVENOUS
  Filled 2016-11-29 (×2): qty 2

## 2016-11-29 MED ORDER — CIPROFLOXACIN IN D5W 400 MG/200ML IV SOLN
400.0000 mg | Freq: Two times a day (BID) | INTRAVENOUS | Status: DC
  Administered 2016-11-29 – 2016-12-01 (×4): 400 mg via INTRAVENOUS
  Filled 2016-11-29 (×4): qty 200

## 2016-11-29 MED ORDER — FENTANYL CITRATE (PF) 100 MCG/2ML IJ SOLN
25.0000 ug | INTRAMUSCULAR | Status: DC | PRN
  Administered 2016-11-29 – 2016-12-01 (×2): 25 ug via INTRAVENOUS
  Filled 2016-11-29 (×2): qty 2

## 2016-11-29 MED ORDER — IRBESARTAN 75 MG PO TABS
37.5000 mg | ORAL_TABLET | Freq: Every day | ORAL | Status: DC
  Administered 2016-12-01: 37.5 mg via ORAL
  Filled 2016-11-29: qty 1

## 2016-11-29 MED ORDER — MORPHINE SULFATE (PF) 2 MG/ML IV SOLN: 2.0000 mg | INTRAVENOUS | Status: DC | PRN

## 2016-11-29 MED ORDER — MORPHINE SULFATE (PF) 4 MG/ML IV SOLN
4.0000 mg | INTRAVENOUS | Status: DC | PRN
  Administered 2016-11-29: 4 mg via INTRAVENOUS
  Filled 2016-11-29: qty 1

## 2016-11-29 MED ORDER — INSULIN ASPART PROT & ASPART (70-30 MIX) 100 UNIT/ML ~~LOC~~ SUSP
3.0000 [IU] | Freq: Two times a day (BID) | SUBCUTANEOUS | Status: DC
  Administered 2016-11-29 – 2016-12-01 (×4): 3 [IU] via SUBCUTANEOUS
  Filled 2016-11-29: qty 10

## 2016-11-29 MED ORDER — SODIUM CHLORIDE 0.9 % IV SOLN
INTRAVENOUS | Status: DC
  Administered 2016-11-29 – 2016-11-30 (×3): via INTRAVENOUS

## 2016-11-29 MED ORDER — ACETAMINOPHEN 650 MG RE SUPP
650.0000 mg | Freq: Four times a day (QID) | RECTAL | Status: DC | PRN
  Filled 2016-11-29: qty 1

## 2016-11-29 MED ORDER — PANTOPRAZOLE SODIUM 40 MG IV SOLR
40.0000 mg | Freq: Once | INTRAVENOUS | Status: AC
  Administered 2016-11-29: 40 mg via INTRAVENOUS
  Filled 2016-11-29: qty 40

## 2016-11-29 MED ORDER — HYDRALAZINE HCL 20 MG/ML IJ SOLN: 10.0000 mg | Freq: Four times a day (QID) | INTRAMUSCULAR | Status: DC | PRN

## 2016-11-29 MED ORDER — IOPAMIDOL (ISOVUE-300) INJECTION 61%
100.0000 mL | Freq: Once | INTRAVENOUS | Status: AC | PRN
  Administered 2016-11-29: 100 mL via INTRAVENOUS

## 2016-11-29 MED ORDER — PANTOPRAZOLE SODIUM 40 MG IV SOLR
40.0000 mg | Freq: Two times a day (BID) | INTRAVENOUS | Status: DC
  Administered 2016-11-29 – 2016-12-01 (×4): 40 mg via INTRAVENOUS
  Filled 2016-11-29 (×4): qty 40

## 2016-11-29 MED ORDER — FAMOTIDINE IN NACL 20-0.9 MG/50ML-% IV SOLN
20.0000 mg | Freq: Once | INTRAVENOUS | Status: AC
  Administered 2016-11-29: 20 mg via INTRAVENOUS
  Filled 2016-11-29: qty 50

## 2016-11-29 MED ORDER — ACETAMINOPHEN 325 MG PO TABS: 650.0000 mg | ORAL_TABLET | Freq: Four times a day (QID) | ORAL | Status: DC | PRN

## 2016-11-29 MED ORDER — METRONIDAZOLE IN NACL 5-0.79 MG/ML-% IV SOLN
500.0000 mg | Freq: Three times a day (TID) | INTRAVENOUS | Status: DC
  Administered 2016-11-29 – 2016-12-01 (×5): 500 mg via INTRAVENOUS
  Filled 2016-11-29 (×5): qty 100

## 2016-11-29 MED ORDER — LEVALBUTEROL HCL 0.63 MG/3ML IN NEBU: 0.6300 mg | INHALATION_SOLUTION | Freq: Four times a day (QID) | RESPIRATORY_TRACT | Status: DC | PRN

## 2016-11-29 MED ORDER — INSULIN ASPART 100 UNIT/ML ~~LOC~~ SOLN
0.0000 [IU] | SUBCUTANEOUS | Status: DC
  Administered 2016-11-29 – 2016-11-30 (×3): 2 [IU] via SUBCUTANEOUS

## 2016-11-29 MED ORDER — ENOXAPARIN SODIUM 40 MG/0.4ML ~~LOC~~ SOLN
40.0000 mg | SUBCUTANEOUS | Status: DC
  Administered 2016-11-29: 40 mg via SUBCUTANEOUS
  Filled 2016-11-29: qty 0.4

## 2016-11-29 NOTE — Consult Note (Signed)
Reason for Consult: Referring Physician:  PCP Dr. Berdine Addison. Adrian Cross is an 71 y.o. male.  HPI: Admitted thru the ED today. C/o nausea. Stated he vomited all night. He could not even drink water. Nausea started 2 nights ago. Also c/o epigastric pain.  Pain started 2 days ago.  Underwent a CT scan today which revealed: IMPRESSION: 1. Prominent edema/inflammation in the hepatoduodenal ligament and anterior pararenal space. This is associated with edematous wall thickening diffusely in the duodenum (see image 45 series 6). Given appearance of duodenum, this is felt to be the etiology of the edema/fluid in the abdomen and pancreatitis/cholecystitis are considered less likely possibilities. Assuming duodenum as primary etiology, infectious/inflammatory duodenitis are considerations. Duodenal involvement is more diffuse than typically seen for peptic ulcer disease. There is no evidence for extraluminal gas or retroperitoneal abscess. Although major arterial and venous anatomy of the abdomen opacifies normally, there is poor enhancement in the wall the duodenum which may be related to the edema although ischemia cannot be entirely excluded.  He denies drinking etoh. Has not drank in 45 yrs. Quit smoking 8 days ago.  States he has hx of ulcers years ago but has never had an endoscopy.  States he can eat about anything he wants. He avoid onions, tomatoes and cucumbers.  States 1 week ago he fell at church. 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 Found lying on the ground for about 45 minutes. Blisters occurred after he fell.  Up until two days ago he was his normal self.  His BMs are normal, though he has not had a BM in 2 days.  He says he has lost 2-3 pounds over the past 2-3 day. States he does not take Meloxicam.  Hx of diabetes and hypertension. No family hx of IBD.   Retired Administrator for [employer]. Married 3 grown girls. Two in good health. One daughter recently had a  stroke.   Past Medical History:  Diagnosis Date  . GERD (gastroesophageal reflux disease)   . Hypertension   . Omental mass 2015   abd abscess  . Pinched nerve    "back" (10/30/2013)  . Type II diabetes mellitus ([place])     Past Surgical History:  Procedure Laterality Date  . CARPAL TUNNEL RELEASE Left 03/2013  . KNEE ARTHROSCOPY Left 1980's  . LAPAROTOMY N/A 11/03/2013   Procedure: EXPLORATORY LAPAROTOMY for resection of omental mass;  Surgeon: Imogene Burn. Georgette Dover, MD;  Location: [facility];  Service: General;  Laterality: N/A;    Family History  Problem Relation Age of Onset  . Diabetes Mellitus II Sister   . Stroke Neg Hx     Social History:  reports that he has been smoking Cigarettes.  He has a 56.00 pack-year smoking history. He has never used smokeless tobacco. He reports that he does not drink alcohol or use drugs.  Allergies: No Known Allergies  Medications: I have reviewed the patient's current medications.  Results for orders placed or performed during the hospital encounter of 11/29/16 (from the past 48 hour(s))  Comprehensive metabolic panel     Status: Abnormal   Collection Time: 11/29/16  7:49 AM  Result Value Ref Range   Sodium 134 (L) 135 - 145 mmol/L   Potassium 3.7 3.5 - 5.1 mmol/L   Chloride 96 (L) 101 - 111 mmol/L   CO2 28 22 - 32 mmol/L   Glucose, Bld 223 (H) 65 - 99 mg/dL   BUN 20 6 - 20  mg/dL   Creatinine, Ser 1.16 0.61 - 1.24 mg/dL   Calcium 9.6 8.9 - 10.3 mg/dL   Total Protein 6.9 6.5 - 8.1 g/dL   Albumin 3.4 (L) 3.5 - 5.0 g/dL   AST 21 15 - 41 U/L   ALT 37 17 - 63 U/L   Alkaline Phosphatase 72 38 - 126 U/L   Total Bilirubin 0.6 0.3 - 1.2 mg/dL   GFR calc non Af Amer >60 >60 mL/min   GFR calc Af Amer >60 >60 mL/min    Comment: (NOTE) The eGFR has been calculated using the CKD EPI equation. This calculation has not been validated in all clinical situations. eGFR's persistently <60 mL/min signify possible Chronic Kidney Disease.    Anion gap 10 5  - 15  CBC with Differential/Platelet     Status: Abnormal   Collection Time: 11/29/16  7:49 AM  Result Value Ref Range   WBC 11.9 (H) 4.0 - 10.5 K/uL   RBC 5.78 4.22 - 5.81 MIL/uL   Hemoglobin 18.2 (H) 13.0 - 17.0 g/dL   HCT 51.1 39.0 - 52.0 %   MCV 88.4 78.0 - 100.0 fL   MCH 31.5 26.0 - 34.0 pg   MCHC 35.6 30.0 - 36.0 g/dL   RDW 13.2 11.5 - 15.5 %   Platelets 166 150 - 400 K/uL   Neutrophils Relative % 70 %   Neutro Abs 8.3 (H) 1.7 - 7.7 K/uL   Lymphocytes Relative 24 %   Lymphs Abs 2.9 0.7 - 4.0 K/uL   Monocytes Relative 6 %   Monocytes Absolute 0.7 0.1 - 1.0 K/uL   Eosinophils Relative 0 %   Eosinophils Absolute 0.0 0.0 - 0.7 K/uL   Basophils Relative 0 %   Basophils Absolute 0.0 0.0 - 0.1 K/uL  Lipase, blood     Status: None   Collection Time: 11/29/16  7:49 AM  Result Value Ref Range   Lipase 28 11 - 51 U/L  Troponin I     Status: None   Collection Time: 11/29/16  7:50 AM  Result Value Ref Range   Troponin I <0.03 <0.03 ng/mL  Urinalysis, Routine w reflex microscopic     Status: Abnormal   Collection Time: 11/29/16  9:33 AM  Result Value Ref Range   Color, Urine STRAW (A) YELLOW   APPearance CLEAR CLEAR   Specific Gravity, Urine 1.023 1.005 - 1.030   pH 8.0 5.0 - 8.0   Glucose, UA >=500 (A) NEGATIVE mg/dL   Hgb urine dipstick NEGATIVE NEGATIVE   Bilirubin Urine NEGATIVE NEGATIVE   Ketones, ur 20 (A) NEGATIVE mg/dL   Protein, ur NEGATIVE NEGATIVE mg/dL   Nitrite NEGATIVE NEGATIVE   Leukocytes, UA NEGATIVE NEGATIVE   RBC / HPF 0-5 0 - 5 RBC/hpf   WBC, UA 0-5 0 - 5 WBC/hpf   Bacteria, UA NONE SEEN NONE SEEN   Squamous Epithelial / LPF NONE SEEN NONE SEEN   Mucus PRESENT   I-Stat CG4 Lactic Acid, ED     Status: Abnormal   Collection Time: 11/29/16 10:56 AM  Result Value Ref Range   Lactic Acid, Venous 2.19 (HH) 0.5 - 1.9 mmol/L   Comment NOTIFIED PHYSICIAN     Dg Chest 2 View  Result Date: 11/29/2016 CLINICAL DATA:  Nausea vomiting and abdominal pain EXAM:  CHEST  2 VIEW COMPARISON:  11/01/2013 FINDINGS: Normal heart size and mediastinal contours. No acute infiltrate or edema. No effusion or pneumothorax. No acute osseous findings. IMPRESSION: No evidence  of active disease. Electronically Signed   By: Monte Fantasia M.D.   On: 11/29/2016 09:11   Ct Abdomen Pelvis W Contrast  Result Date: 11/29/2016 CLINICAL DATA:  Abdominal pain and nausea for 1 day. EXAM: CT ABDOMEN AND PELVIS WITH CONTRAST TECHNIQUE: Multidetector CT imaging of the abdomen and pelvis was performed using the standard protocol following bolus administration of intravenous contrast. CONTRAST:  136m ISOVUE-300 IOPAMIDOL (ISOVUE-300) INJECTION 61% COMPARISON:  10/30/2013 FINDINGS: Lower chest:  Dependent atelectasis lower lobes. Hepatobiliary: No focal abnormality within the liver parenchyma. There is no evidence for gallstones, gallbladder wall thickening, or pericholecystic fluid. No intrahepatic or extrahepatic biliary dilation. Pancreas: No focal mass lesion. No dilatation of the main duct. No intraparenchymal cyst. No peripancreatic edema. Spleen: No splenomegaly. No focal mass lesion. Adrenals/Urinary Tract: No adrenal nodule or mass. Tiny hypodensity anterior right mid kidney is stable in compatible with a cortical cyst. Left kidney unremarkable. No evidence for hydroureter. The urinary bladder appears normal for the degree of distention. Stomach/Bowel: Mild circumferential wall thickening noted distal esophagus. Stomach unremarkable. Edema and irregular wall thickening is noted in the duodenum diffusely. There is fairly marked edema and inflammation along the entire length of the duodenum. Jejunum unremarkable. Distal small bowel is nondilated. The terminal ileum is normal. The appendix is normal. No gross colonic mass. No colonic wall thickening. No substantial diverticular change. Vascular/Lymphatic: There is abdominal aortic atherosclerosis without aneurysm. Portal vein and superior  mesenteric vein are patent. Celiac axis and SMA opacified normally. IMA appears patent. There is no gastrohepatic or hepatoduodenal ligament lymphadenopathy. No intraperitoneal or retroperitoneal lymphadenopathy. No pelvic sidewall lymphadenopathy. Reproductive: The prostate gland and seminal vesicles have normal imaging features. Other: Small volume free fluid identified at the inferior liver, in the right para colic gutter, and in the anatomic pelvis. There is some perirectal edema evident mild body wall edema is associated. Musculoskeletal: Bone windows reveal no worrisome lytic or sclerotic osseous lesions. IMPRESSION: 1. Prominent edema/inflammation in the hepatoduodenal ligament and anterior pararenal space. This is associated with edematous wall thickening diffusely in the duodenum (see image 45 series 6). Given appearance of duodenum, this is felt to be the etiology of the edema/fluid in the abdomen and pancreatitis/cholecystitis are considered less likely possibilities. Assuming duodenum as primary etiology, infectious/inflammatory duodenitis are considerations. Duodenal involvement is more diffuse than typically seen for peptic ulcer disease. There is no evidence for extraluminal gas or retroperitoneal abscess. Although major arterial and venous anatomy of the abdomen opacifies normally, there is poor enhancement in the wall the duodenum which may be related to the edema although ischemia cannot be entirely excluded. Electronically Signed   By: EMisty StanleyM.D.   On: 11/29/2016 09:41    ROS Blood pressure (!) 152/69, pulse (!) 56, temperature 98.7 F (37.1 C), temperature source Oral, resp. rate 20, height 5' 4"  (1.626 m), weight 171 lb 9.6 oz (77.8 kg), SpO2 98 %. Physical Exam  Alert and oriented. Skin warm and dry. Oral mucosa is moist.   . Sclera anicteric, conjunctivae is pink. Thyroid not enlarged. No cervical lymphadenopathy. Lungs clear. Heart regular rate and rhythm.  Abdomen is soft.  Bowel sounds are positive. No hepatomegaly. No abdominal masses felt. Tenderness epigastric region.  No edema to lower extremities.  2nd degree burns across his finger rt hand.    Assessment/Plan: Infectious/inflammatory duodenitis. Agree with Cipro and Flagyl.  Agree with IV Protonix BID NPO status.  Dr. RLaural Goldenis aware.   SETZER,TERRI W 11/29/2016, 1:36 PM

## 2016-11-29 NOTE — H&P (Signed)
[facility] History and Physical  Adrian Cross ZDG:[id number] DOB: October 06, 1945 DOA: 11/29/2016  Referring physician:   PCP: Adrian Corrigan, MD   Chief Complaint:  Abdominal pain  HPI:  71 year old male with a history of hypertension presented due to intractable nausea vomiting all night associated with epigastric pain. Symptoms started 2 days ago. Denied any hematemesis or melena. He was just released from [facility] yesterday for a wound on his right hand, which she sustained from a fall. Currently is a resident of [place] home. Vomiting is described as projectile, nonbloody. Patient admitted found to have edema/inflammation of the hepatoduodenal ligament and anterior pararenal space associated with wall thickening of the duodenum. Duodenitis considered versus peptic ulcer disease. No evidence of perforation or abscess. Patient also hypertensive but afebrile in the ED Patient is being admitted for the above symptoms       Review of Systems: negative for the following  Constitutional: Denies fever, chills, diaphoresis, appetite change and fatigue.  HEENT: Denies photophobia, eye pain, redness, hearing loss, ear pain, congestion, sore throat, rhinorrhea, sneezing, mouth sores, trouble swallowing, neck pain, neck stiffness and tinnitus.  Respiratory: Denies SOB, DOE, cough, chest tightness, and wheezing.  Cardiovascular: Denies chest pain, palpitations and leg swelling.  Gastrointestinal:Positive for vomiting,  abdominal pain, diarrhea, constipation, blood in stool and abdominal distention.  Genitourinary: Denies dysuria, urgency, frequency, hematuria, flank pain and difficulty urinating.  Musculoskeletal: Denies myalgias, back pain, joint swelling, arthralgias and gait problem.  Skin: Denies pallor, rash and wound.  Neurological: Denies dizziness, seizures, syncope, weakness, light-headedness, numbness and headaches.  Hematological: Denies adenopathy. Easy  bruising, personal or family bleeding history  Psychiatric/Behavioral: Denies suicidal ideation, mood changes, confusion, nervousness, sleep disturbance and agitation       Past Medical History:  Diagnosis Date  . GERD (gastroesophageal reflux disease)   . Hypertension   . Omental mass 2015   abd abscess  . Pinched nerve    "back" (10/30/2013)  . Type II diabetes mellitus (Adrian Cross)      Past Surgical History:  Procedure Laterality Date  . CARPAL TUNNEL RELEASE Left 03/2013  . KNEE ARTHROSCOPY Left 1980's  . LAPAROTOMY N/A 11/03/2013   Procedure: EXPLORATORY LAPAROTOMY for resection of omental mass;  Surgeon: Adrian Cross. Adrian Dover, MD;  Location: [place];  Service: General;  Laterality: N/A;      Social History:  reports that he has been smoking Cigarettes.  He has a 56.00 pack-year smoking history. He has never used smokeless tobacco. He reports that he does not drink alcohol or use drugs.    No Known Allergies  Family History  Problem Relation Age of Onset  . Diabetes Mellitus II Sister   . Stroke Neg Hx          Prior to Admission medications   Medication Sig Start Date End Date Taking? Authorizing Provider  gabapentin (NEURONTIN) 300 MG capsule Take 300 mg by mouth 2 (two) times daily.   Yes [provider]  glyBURIDE (DIABETA) 5 MG tablet Take 10 mg by mouth 2 (two) times daily with a meal.   Yes [provider]  Insulin Lispro Prot & Lispro (HUMALOG MIX 75/25 KWIKPEN) (75-25) 100 UNIT/ML Kwikpen Inject 6 Units into the skin 2 (two) times daily with a meal. With breakfast and supper 11/09/13  Yes Cross, David, MD  lisinopril (PRINIVIL,ZESTRIL) 40 MG tablet Take 40 mg by mouth daily. 09/27/13  Yes [provider]  meloxicam (MOBIC) 7.5 MG tablet Take 7.5  mg by mouth daily.   Yes [provider]  olmesartan (BENICAR) 20 MG tablet Take 20 mg by mouth 2 (two) times daily.   Yes [provider]  ciprofloxacin (CIPRO) 500 MG tablet Take 1  tablet (500 mg total) by mouth 2 (two) times daily. 11/09/13   Adrian Eva, MD  metroNIDAZOLE (FLAGYL) 500 MG tablet Take 1 tablet (500 mg total) by mouth every 8 (eight) hours. 11/09/13   Adrian Eva, MD  UNABLE TO FIND Mr. Adrian Cross was admitted to the hospital from 10/30/13 to 11/09/13.  He is medically stable to return to work 11/09/13   Adrian Eva, MD     Physical Exam: Vitals:   11/29/16 1130 11/29/16 1200 11/29/16 1230 11/29/16 1315  BP: (!) 144/77 (!) 184/89 (!) 144/75 (!) 152/69  Pulse:  77 62 (!) 56  Resp: 10 (!) 21 19 20   Temp:    98.7 F (37.1 C)  TempSrc:    Oral  SpO2:  98% 97% 98%  Weight:    77.8 kg (171 lb 9.6 oz)  Height:    5\' 4"  (1.626 m)        Vitals:   11/29/16 1130 11/29/16 1200 11/29/16 1230 11/29/16 1315  BP: (!) 144/77 (!) 184/89 (!) 144/75 (!) 152/69  Pulse:  77 62 (!) 56  Resp: 10 (!) 21 19 20   Temp:    98.7 F (37.1 C)  TempSrc:    Oral  SpO2:  98% 97% 98%  Weight:    77.8 kg (171 lb 9.6 oz)  Height:    5\' 4"  (1.626 m)   Constitutional: NAD, calm, comfortable Eyes: PERRL, lids and conjunctivae normal ENMT: Mucous membranes are moist. Posterior pharynx clear of any exudate or lesions.Normal dentition.  Neck: normal, supple, no masses, no thyromegaly Respiratory: clear to auscultation bilaterally, no wheezing, no crackles. Normal respiratory effort. No accessory muscle use.  Cardiovascular: Regular rate and rhythm, no murmurs / rubs / gallops. No extremity edema. 2+ pedal pulses. No carotid bruits.  Abdomen: no tenderness, no masses palpated. No hepatosplenomegaly. Bowel sounds positive.  Musculoskeletal: no clubbing / cyanosis. No joint deformity upper and lower extremities. Good ROM, no contractures. Normal muscle tone.  Skin: no rashes, lesions, ulcers. No induration Neurologic: CN 2-12 grossly intact. Sensation intact, DTR normal. Strength 5/5 in all 4.  Psychiatric: Normal judgment and insight. Alert and oriented x 3. Normal mood.      Labs on Admission: I have personally reviewed following labs and imaging studies  CBC:  Recent Labs Lab 11/29/16 0749  WBC 11.9*  NEUTROABS 8.3*  HGB 18.2*  HCT 51.1  MCV 88.4  PLT 379    Basic Metabolic Panel:  Recent Labs Lab 11/29/16 0749  NA 134*  K 3.7  CL 96*  CO2 28  GLUCOSE 223*  BUN 20  CREATININE 1.16  CALCIUM 9.6    GFR: Estimated Creatinine Clearance: 56.6 mL/min (by C-G formula based on SCr of 1.16 mg/dL).  Liver Function Tests:  Recent Labs Lab 11/29/16 0749  AST 21  ALT 37  ALKPHOS 72  BILITOT 0.6  PROT 6.9  ALBUMIN 3.4*    Recent Labs Lab 11/29/16 0749  LIPASE 28   No results for input(s): AMMONIA in the last 168 hours.  Coagulation Profile: No results for input(s): INR, PROTIME in the last 168 hours. No results for input(s): DDIMER in the last 72 hours.  Cardiac Enzymes:  Recent Labs Lab 11/29/16 0750  TROPONINI <0.03  BNP (last 3 results) No results for input(s): PROBNP in the last 8760 hours.  HbA1C: No results for input(s): HGBA1C in the last 72 hours. Lab Results  Component Value Date   HGBA1C 11.9 (H) 11/06/2013     CBG: No results for input(s): GLUCAP in the last 168 hours.  Lipid Profile: No results for input(s): CHOL, HDL, LDLCALC, TRIG, CHOLHDL, LDLDIRECT in the last 72 hours.  Thyroid Function Tests: No results for input(s): TSH, T4TOTAL, FREET4, T3FREE, THYROIDAB in the last 72 hours.  Anemia Panel: No results for input(s): VITAMINB12, FOLATE, FERRITIN, TIBC, IRON, RETICCTPCT in the last 72 hours.  Urine analysis:    Component Value Date/Time   COLORURINE STRAW (A) 11/29/2016 0933   APPEARANCEUR CLEAR 11/29/2016 0933   LABSPEC 1.023 11/29/2016 0933   PHURINE 8.0 11/29/2016 0933   GLUCOSEU >=500 (A) 11/29/2016 0933   HGBUR NEGATIVE 11/29/2016 0933   BILIRUBINUR NEGATIVE 11/29/2016 0933   KETONESUR 20 (A) 11/29/2016 0933   PROTEINUR NEGATIVE 11/29/2016 0933   NITRITE NEGATIVE 11/29/2016  0933   LEUKOCYTESUR NEGATIVE 11/29/2016 0933    Sepsis Labs: @LABRCNTIP (procalcitonin:4,lacticidven:4) )No results found for this or any previous visit (from the past 240 hour(s)).       Radiological Exams on Admission: Dg Chest 2 View  Result Date: 11/29/2016 CLINICAL DATA:  Nausea vomiting and abdominal pain EXAM: CHEST  2 VIEW COMPARISON:  11/01/2013 FINDINGS: Normal heart size and mediastinal contours. No acute infiltrate or edema. No effusion or pneumothorax. No acute osseous findings. IMPRESSION: No evidence of active disease. Electronically Signed   By: Monte Fantasia M.D.   On: 11/29/2016 09:11   Ct Abdomen Pelvis W Contrast  Result Date: 11/29/2016 CLINICAL DATA:  Abdominal pain and nausea for 1 day. EXAM: CT ABDOMEN AND PELVIS WITH CONTRAST TECHNIQUE: Multidetector CT imaging of the abdomen and pelvis was performed using the standard protocol following bolus administration of intravenous contrast. CONTRAST:  131mL ISOVUE-300 IOPAMIDOL (ISOVUE-300) INJECTION 61% COMPARISON:  10/30/2013 FINDINGS: Lower chest:  Dependent atelectasis lower lobes. Hepatobiliary: No focal abnormality within the liver parenchyma. There is no evidence for gallstones, gallbladder wall thickening, or pericholecystic fluid. No intrahepatic or extrahepatic biliary dilation. Pancreas: No focal mass lesion. No dilatation of the main duct. No intraparenchymal cyst. No peripancreatic edema. Spleen: No splenomegaly. No focal mass lesion. Adrenals/Urinary Tract: No adrenal nodule or mass. Tiny hypodensity anterior right mid kidney is stable in compatible with a cortical cyst. Left kidney unremarkable. No evidence for hydroureter. The urinary bladder appears normal for the degree of distention. Stomach/Bowel: Mild circumferential wall thickening noted distal esophagus. Stomach unremarkable. Edema and irregular wall thickening is noted in the duodenum diffusely. There is fairly marked edema and inflammation along the  entire length of the duodenum. Jejunum unremarkable. Distal small bowel is nondilated. The terminal ileum is normal. The appendix is normal. No gross colonic mass. No colonic wall thickening. No substantial diverticular change. Vascular/Lymphatic: There is abdominal aortic atherosclerosis without aneurysm. Portal vein and superior mesenteric vein are patent. Celiac axis and SMA opacified normally. IMA appears patent. There is no gastrohepatic or hepatoduodenal ligament lymphadenopathy. No intraperitoneal or retroperitoneal lymphadenopathy. No pelvic sidewall lymphadenopathy. Reproductive: The prostate gland and seminal vesicles have normal imaging features. Other: Small volume free fluid identified at the inferior liver, in the right para colic gutter, and in the anatomic pelvis. There is some perirectal edema evident mild body wall edema is associated. Musculoskeletal: Bone windows reveal no worrisome lytic or sclerotic osseous lesions. IMPRESSION: 1. Prominent  edema/inflammation in the hepatoduodenal ligament and anterior pararenal space. This is associated with edematous wall thickening diffusely in the duodenum (see image 45 series 6). Given appearance of duodenum, this is felt to be the etiology of the edema/fluid in the abdomen and pancreatitis/cholecystitis are considered less likely possibilities. Assuming duodenum as primary etiology, infectious/inflammatory duodenitis are considerations. Duodenal involvement is more diffuse than typically seen for peptic ulcer disease. There is no evidence for extraluminal gas or retroperitoneal abscess. Although major arterial and venous anatomy of the abdomen opacifies normally, there is poor enhancement in the wall the duodenum which may be related to the edema although ischemia cannot be entirely excluded. Electronically Signed   By: Misty Stanley M.D.   On: 11/29/2016 09:41   Dg Chest 2 View  Result Date: 11/29/2016 CLINICAL DATA:  Nausea vomiting and abdominal  pain EXAM: CHEST  2 VIEW COMPARISON:  11/01/2013 FINDINGS: Normal heart size and mediastinal contours. No acute infiltrate or edema. No effusion or pneumothorax. No acute osseous findings. IMPRESSION: No evidence of active disease. Electronically Signed   By: Monte Fantasia M.D.   On: 11/29/2016 09:11   Ct Abdomen Pelvis W Contrast  Result Date: 11/29/2016 CLINICAL DATA:  Abdominal pain and nausea for 1 day. EXAM: CT ABDOMEN AND PELVIS WITH CONTRAST TECHNIQUE: Multidetector CT imaging of the abdomen and pelvis was performed using the standard protocol following bolus administration of intravenous contrast. CONTRAST:  146mL ISOVUE-300 IOPAMIDOL (ISOVUE-300) INJECTION 61% COMPARISON:  10/30/2013 FINDINGS: Lower chest:  Dependent atelectasis lower lobes. Hepatobiliary: No focal abnormality within the liver parenchyma. There is no evidence for gallstones, gallbladder wall thickening, or pericholecystic fluid. No intrahepatic or extrahepatic biliary dilation. Pancreas: No focal mass lesion. No dilatation of the main duct. No intraparenchymal cyst. No peripancreatic edema. Spleen: No splenomegaly. No focal mass lesion. Adrenals/Urinary Tract: No adrenal nodule or mass. Tiny hypodensity anterior right mid kidney is stable in compatible with a cortical cyst. Left kidney unremarkable. No evidence for hydroureter. The urinary bladder appears normal for the degree of distention. Stomach/Bowel: Mild circumferential wall thickening noted distal esophagus. Stomach unremarkable. Edema and irregular wall thickening is noted in the duodenum diffusely. There is fairly marked edema and inflammation along the entire length of the duodenum. Jejunum unremarkable. Distal small bowel is nondilated. The terminal ileum is normal. The appendix is normal. No gross colonic mass. No colonic wall thickening. No substantial diverticular change. Vascular/Lymphatic: There is abdominal aortic atherosclerosis without aneurysm. Portal vein and  superior mesenteric vein are patent. Celiac axis and SMA opacified normally. IMA appears patent. There is no gastrohepatic or hepatoduodenal ligament lymphadenopathy. No intraperitoneal or retroperitoneal lymphadenopathy. No pelvic sidewall lymphadenopathy. Reproductive: The prostate gland and seminal vesicles have normal imaging features. Other: Small volume free fluid identified at the inferior liver, in the right para colic gutter, and in the anatomic pelvis. There is some perirectal edema evident mild body wall edema is associated. Musculoskeletal: Bone windows reveal no worrisome lytic or sclerotic osseous lesions. IMPRESSION: 1. Prominent edema/inflammation in the hepatoduodenal ligament and anterior pararenal space. This is associated with edematous wall thickening diffusely in the duodenum (see image 45 series 6). Given appearance of duodenum, this is felt to be the etiology of the edema/fluid in the abdomen and pancreatitis/cholecystitis are considered less likely possibilities. Assuming duodenum as primary etiology, infectious/inflammatory duodenitis are considerations. Duodenal involvement is more diffuse than typically seen for peptic ulcer disease. There is no evidence for extraluminal gas or retroperitoneal abscess. Although major arterial and venous anatomy  of the abdomen opacifies normally, there is poor enhancement in the wall the duodenum which may be related to the edema although ischemia cannot be entirely excluded. Electronically Signed   By: Misty Stanley M.D.   On: 11/29/2016 09:41      EKG: Independently reviewed. * Sinus rhythm Borderline right axis deviation Low voltage, precordial leads Borderline repolarization abnormality  Assessment/Plan    Enteritis/  Duodenitis Patient admitted for the above symptoms including intractable nausea vomiting CT scan revealed inflammation in the duodenal area Cannot rule out infection Patient has had no diarrhea Supportive therapy with  antibiotics, IV fluids, antinausea medicine GI has been consulted for possible EGD  Possible peptic ulcer disease No perforation seen   Started on IV PPI   Diabetes Insulin-dependent/check hemoglobin A1c Decrease insulin 70/30 and add sliding scale insulin       DVT prophylaxis: * Lovenox    consults called:  Family Communication: Admission, patients condition and plan of care including tests being ordered have been discussed with the patient  who indicates understanding and agree with the plan and Code Status  Admission status: inpatient    Disposition plan: Further plan will depend as patient's clinical course evolves and further radiologic and laboratory data become available. Likely home when stable   At the time of admission, it appears that the appropriate admission status for this patient is INPATIENT .Thisis judged to be reasonable and necessary in order to provide the required intensity of service to ensure the patient's safetygiven thepresenting symptoms, physical exam findings, and initial radiographic and laboratory data in the context of their chronic comorbidities.   Reyne Dumas MD [facility] Pager [phone]  If 7PM-7AM, please contact night-coverage [email or web address] Password [facility]  11/29/2016, 3:45 PM

## 2016-11-29 NOTE — ED Provider Notes (Signed)
[facility] DEPT Provider Note   CSN: [id number] Arrival date & time: 11/29/16  3557     History   Chief Complaint Chief Complaint  Patient presents with  . Emesis    HPI KRISTOFFER BALA is a 71 y.o. male.   Emesis      Pt was seen at 0710.  Per pt, c/o gradual onset and persistence of constant generalized abd "pain" since overnight last night.  Has been associated with multiple intermittent episodes of N/V.  Describes the abd pain as "cramping."  Denies diarrhea, no fevers, no back pain, no rash, no CP/SOB, no black or blood in stools or emesis.      Past Medical History:  Diagnosis Date  . GERD (gastroesophageal reflux disease)   . Hypertension   . Omental mass 2015   abd abscess  . Pinched nerve    "back" (10/30/2013)  . Type II diabetes mellitus [facility])     Patient Active Problem List   Diagnosis Date Noted  . Ileus, postoperative ([place]) 11/06/2013  . Abdominal abscess 10/30/2013  . HTN (hypertension) 10/30/2013  . Diabetes mellitus ([place]) 10/30/2013  . Abdominal mass 10/30/2013    Past Surgical History:  Procedure Laterality Date  . CARPAL TUNNEL RELEASE Left 03/2013  . KNEE ARTHROSCOPY Left 1980's  . LAPAROTOMY N/A 11/03/2013   Procedure: EXPLORATORY LAPAROTOMY for resection of omental mass;  Surgeon: Imogene Burn. Georgette Dover, MD;  Location: [facility] OR;  Service: General;  Laterality: N/A;       Home Medications    Prior to Admission medications   Medication Sig Start Date End Date Taking? Authorizing Provider  cholecalciferol (VITAMIN D) 1000 units tablet Take 2,000 Units by mouth daily.   Yes [provider]  gabapentin (NEURONTIN) 300 MG capsule Take 300 mg by mouth 2 (two) times daily.   Yes [provider]  glyBURIDE (DIABETA) 5 MG tablet Take 10 mg by mouth 2 (two) times daily with a meal.   Yes [provider]  meloxicam (MOBIC) 7.5 MG tablet Take 7.5 mg by mouth daily.   Yes [provider]  nicotine (NICODERM CQ - DOSED  IN MG/24 HOURS) 14 mg/24hr patch Place 14 mg onto the skin daily.   Yes [provider]  olmesartan (BENICAR) 20 MG tablet Take 20 mg by mouth 2 (two) times daily.   Yes [provider]  sulfamethoxazole-trimethoprim (BACTRIM DS,SEPTRA DS) 800-160 MG tablet Take 1 tablet by mouth 2 (two) times daily.   Yes [provider]  ciprofloxacin (CIPRO) 500 MG tablet Take 1 tablet (500 mg total) by mouth 2 (two) times daily. 11/09/13   Orson Eva, MD  Insulin Lispro Prot & Lispro (HUMALOG MIX 75/25 KWIKPEN) (75-25) 100 UNIT/ML Kwikpen Inject 6 Units into the skin 2 (two) times daily with a meal. With breakfast and supper 11/09/13   Tat, Shanon Brow, MD  lisinopril (PRINIVIL,ZESTRIL) 40 MG tablet Take 40 mg by mouth daily. 09/27/13   [provider]  metroNIDAZOLE (FLAGYL) 500 MG tablet Take 1 tablet (500 mg total) by mouth every 8 (eight) hours. 11/09/13   Orson Eva, MD  UNABLE TO FIND Mr. Fitzroy Mikami was admitted to the hospital from 10/30/13 to 11/09/13.  He is medically stable to return to work 11/09/13   Orson Eva, MD    Family History Family History  Problem Relation Age of Onset  . Diabetes Mellitus II Sister   . Stroke Neg Hx     Social History Social History  Substance  Use Topics  . Smoking status: Current Every Day Smoker    Packs/day: 1.00    Years: 56.00    Types: Cigarettes  . Smokeless tobacco: Never Used  . Alcohol use No     Allergies   Patient has no known allergies.   Review of Systems Review of Systems  Gastrointestinal: Positive for vomiting.   ROS: Statement: All systems negative except as marked or noted in the HPI; Constitutional: Negative for fever and chills. ; ; Eyes: Negative for eye pain, redness and discharge. ; ; ENMT: Negative for ear pain, hoarseness, nasal congestion, sinus pressure and sore throat. ; ; Cardiovascular: Negative for chest pain, palpitations, diaphoresis, dyspnea and peripheral edema. ; ; Respiratory: Negative for  cough, wheezing and stridor. ; ; Gastrointestinal: +abd pain, N/V. Negative for diarrhea, blood in stool, hematemesis, jaundice and rectal bleeding. . ; ; Genitourinary: Negative for dysuria, flank pain and hematuria. ; ; Musculoskeletal: Negative for back pain and neck pain. Negative for swelling and trauma.; ; Skin: Negative for pruritus, rash, abrasions, blisters, bruising and skin lesion.; ; Neuro: Negative for headache, lightheadedness and neck stiffness. Negative for weakness, altered level of consciousness, altered mental status, extremity weakness, paresthesias, involuntary movement, seizure and syncope.       Physical Exam Updated Vital Signs BP (!) 171/74 (BP Location: Left Arm)   Pulse 60   Temp 98.5 F (36.9 C) (Oral)   Resp 18   Ht 5\' 3"  (1.6 m)   Wt 72.6 kg (160 lb)   SpO2 95%   BMI 28.34 kg/m   Physical Exam 0715: Physical examination:  Nursing notes reviewed; Vital signs and O2 SAT reviewed;  Constitutional: Well developed, Well nourished, Well hydrated, In no acute distress; Head:  Normocephalic, atraumatic; Eyes: EOMI, PERRL, No scleral icterus; ENMT: Mouth and pharynx normal, Mucous membranes moist; Neck: Supple, Full range of motion, No lymphadenopathy; Cardiovascular: Regular rate and rhythm, No murmur, rub, or gallop; Respiratory: Breath sounds clear & equal bilaterally, No rales, rhonchi, wheezes.  Speaking full sentences with ease, Normal respiratory effort/excursion; Chest: Nontender, Movement normal; Abdomen: Soft,+diffuse tenderness to palp. No rebound or guarding. Nondistended, Normal bowel sounds; Genitourinary: No CVA tenderness; Extremities: Pulses normal, No tenderness, No edema, No calf edema or asymmetry.; Neuro: AA&Ox3, Major CN grossly intact.  Speech clear. No gross focal motor or sensory deficits in extremities.; Skin: Color normal, Warm, Dry.   ED Treatments / Results  Labs (all labs ordered are listed, but only abnormal results are  displayed)   EKG  EKG Interpretation  Date/Time:  Wednesday November 29 2016 06:59:50 EDT Ventricular Rate:  63 PR Interval:    QRS Duration: 91 QT Interval:  397 QTC Calculation: 407 R Axis:   87 Text Interpretation:  Sinus rhythm Borderline right axis deviation Low voltage, precordial leads Borderline repolarization abnormality Baseline wander No old tracing to compare Confirmed by Francine Graven [phone]) on 11/29/2016 7:21:22 AM       Radiology   Procedures Procedures (including critical care time)  Medications Ordered in ED Medications  famotidine (PEPCID) IVPB 20 mg premix (not administered)  ondansetron (ZOFRAN) injection 4 mg (not administered)  morphine 4 MG/ML injection 4 mg (not administered)     Initial Impression / Assessment and Plan / ED Course  I have reviewed the triage vital signs and the nursing notes.  Pertinent labs & imaging results that were available during my care of the patient were reviewed by me and considered in my medical decision making (see  chart for details).  MDM Reviewed: previous chart, nursing note and vitals Reviewed previous: labs Interpretation: labs, x-ray, CT scan and ECG   Results for orders placed or performed during the hospital encounter of 11/29/16  Comprehensive metabolic panel  Result Value Ref Range   Sodium 134 (L) 135 - 145 mmol/L   Potassium 3.7 3.5 - 5.1 mmol/L   Chloride 96 (L) 101 - 111 mmol/L   CO2 28 22 - 32 mmol/L   Glucose, Bld 223 (H) 65 - 99 mg/dL   BUN 20 6 - 20 mg/dL   Creatinine, Ser 1.16 0.61 - 1.24 mg/dL   Calcium 9.6 8.9 - 10.3 mg/dL   Total Protein 6.9 6.5 - 8.1 g/dL   Albumin 3.4 (L) 3.5 - 5.0 g/dL   AST 21 15 - 41 U/L   ALT 37 17 - 63 U/L   Alkaline Phosphatase 72 38 - 126 U/L   Total Bilirubin 0.6 0.3 - 1.2 mg/dL   GFR calc non Af Amer >60 >60 mL/min   GFR calc Af Amer >60 >60 mL/min   Anion gap 10 5 - 15  CBC with Differential/Platelet  Result Value Ref Range   WBC 11.9 (H) 4.0 -  10.5 K/uL   RBC 5.78 4.22 - 5.81 MIL/uL   Hemoglobin 18.2 (H) 13.0 - 17.0 g/dL   HCT 51.1 39.0 - 52.0 %   MCV 88.4 78.0 - 100.0 fL   MCH 31.5 26.0 - 34.0 pg   MCHC 35.6 30.0 - 36.0 g/dL   RDW 13.2 11.5 - 15.5 %   Platelets 166 150 - 400 K/uL   Neutrophils Relative % 70 %   Neutro Abs 8.3 (H) 1.7 - 7.7 K/uL   Lymphocytes Relative 24 %   Lymphs Abs 2.9 0.7 - 4.0 K/uL   Monocytes Relative 6 %   Monocytes Absolute 0.7 0.1 - 1.0 K/uL   Eosinophils Relative 0 %   Eosinophils Absolute 0.0 0.0 - 0.7 K/uL   Basophils Relative 0 %   Basophils Absolute 0.0 0.0 - 0.1 K/uL  Lipase, blood  Result Value Ref Range   Lipase 28 11 - 51 U/L  Troponin I  Result Value Ref Range   Troponin I <0.03 <0.03 ng/mL  Urinalysis, Routine w reflex microscopic  Result Value Ref Range   Color, Urine STRAW (A) YELLOW   APPearance CLEAR CLEAR   Specific Gravity, Urine 1.023 1.005 - 1.030   pH 8.0 5.0 - 8.0   Glucose, UA >=500 (A) NEGATIVE mg/dL   Hgb urine dipstick NEGATIVE NEGATIVE   Bilirubin Urine NEGATIVE NEGATIVE   Ketones, ur 20 (A) NEGATIVE mg/dL   Protein, ur NEGATIVE NEGATIVE mg/dL   Nitrite NEGATIVE NEGATIVE   Leukocytes, UA NEGATIVE NEGATIVE   RBC / HPF 0-5 0 - 5 RBC/hpf   WBC, UA 0-5 0 - 5 WBC/hpf   Bacteria, UA NONE SEEN NONE SEEN   Squamous Epithelial / LPF NONE SEEN NONE SEEN   Mucus PRESENT    Dg Chest 2 View Result Date: 11/29/2016 CLINICAL DATA:  Nausea vomiting and abdominal pain EXAM: CHEST  2 VIEW COMPARISON:  11/01/2013 FINDINGS: Normal heart size and mediastinal contours. No acute infiltrate or edema. No effusion or pneumothorax. No acute osseous findings. IMPRESSION: No evidence of active disease. Electronically Signed   By: Monte Fantasia M.D.   On: 11/29/2016 09:11   Ct Abdomen Pelvis W Contrast Result Date: 11/29/2016 CLINICAL DATA:  Abdominal pain and nausea for 1 day. EXAM: CT  ABDOMEN AND PELVIS WITH CONTRAST TECHNIQUE: Multidetector CT imaging of the abdomen and pelvis was  performed using the standard protocol following bolus administration of intravenous contrast. CONTRAST:  172mL ISOVUE-300 IOPAMIDOL (ISOVUE-300) INJECTION 61% COMPARISON:  10/30/2013 FINDINGS: Lower chest:  Dependent atelectasis lower lobes. Hepatobiliary: No focal abnormality within the liver parenchyma. There is no evidence for gallstones, gallbladder wall thickening, or pericholecystic fluid. No intrahepatic or extrahepatic biliary dilation. Pancreas: No focal mass lesion. No dilatation of the main duct. No intraparenchymal cyst. No peripancreatic edema. Spleen: No splenomegaly. No focal mass lesion. Adrenals/Urinary Tract: No adrenal nodule or mass. Tiny hypodensity anterior right mid kidney is stable in compatible with a cortical cyst. Left kidney unremarkable. No evidence for hydroureter. The urinary bladder appears normal for the degree of distention. Stomach/Bowel: Mild circumferential wall thickening noted distal esophagus. Stomach unremarkable. Edema and irregular wall thickening is noted in the duodenum diffusely. There is fairly marked edema and inflammation along the entire length of the duodenum. Jejunum unremarkable. Distal small bowel is nondilated. The terminal ileum is normal. The appendix is normal. No gross colonic mass. No colonic wall thickening. No substantial diverticular change. Vascular/Lymphatic: There is abdominal aortic atherosclerosis without aneurysm. Portal vein and superior mesenteric vein are patent. Celiac axis and SMA opacified normally. IMA appears patent. There is no gastrohepatic or hepatoduodenal ligament lymphadenopathy. No intraperitoneal or retroperitoneal lymphadenopathy. No pelvic sidewall lymphadenopathy. Reproductive: The prostate gland and seminal vesicles have normal imaging features. Other: Small volume free fluid identified at the inferior liver, in the right para colic gutter, and in the anatomic pelvis. There is some perirectal edema evident mild body wall edema  is associated. Musculoskeletal: Bone windows reveal no worrisome lytic or sclerotic osseous lesions. IMPRESSION: 1. Prominent edema/inflammation in the hepatoduodenal ligament and anterior pararenal space. This is associated with edematous wall thickening diffusely in the duodenum (see image 45 series 6). Given appearance of duodenum, this is felt to be the etiology of the edema/fluid in the abdomen and pancreatitis/cholecystitis are considered less likely possibilities. Assuming duodenum as primary etiology, infectious/inflammatory duodenitis are considerations. Duodenal involvement is more diffuse than typically seen for peptic ulcer disease. There is no evidence for extraluminal gas or retroperitoneal abscess. Although major arterial and venous anatomy of the abdomen opacifies normally, there is poor enhancement in the wall the duodenum which may be related to the edema although ischemia cannot be entirely excluded. Electronically Signed   By: Misty Stanley M.D.   On: 11/29/2016 09:41    1020:  Pt has vomited several times while in the ED. IV pepcid and protonix already given. CT as above. T/C to GI Dr. Laural Golden, case discussed, including:  HPI, pertinent PM/SHx, VS/PE, dx testing, ED course and treatment:  Agrees with admission for further workup, will consult.   1040:  T/C to [facility] Dr. Allyson Sabal, case discussed, including:  HPI, pertinent PM/SHx, VS/PE, dx testing, ED course and treatment:  Agreeable to admit.     Final Clinical Impressions(s) / ED Diagnoses   Final diagnoses:  None    New Prescriptions New Prescriptions   No medications on file      Francine Graven, DO 12/03/16 1825
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
## 2016-11-29 NOTE — ED Notes (Signed)
Patient transported to CT 

## 2016-11-29 NOTE — ED Notes (Addendum)
Pt vomited large amount of bile like substance.

## 2016-11-29 NOTE — ED Notes (Signed)
Daughter [name] [phone]

## 2016-11-30 ENCOUNTER — Inpatient Hospital Stay (HOSPITAL_COMMUNITY): Payer: Medicare HMO

## 2016-11-30 ENCOUNTER — Encounter (HOSPITAL_COMMUNITY): Payer: Self-pay

## 2016-11-30 ENCOUNTER — Encounter (HOSPITAL_COMMUNITY)
Admission: EM | Disposition: A | Discharge status: Skilled Nursing Facility | Payer: Self-pay | Source: Home / Self Care | Attending: Internal Medicine

## 2016-11-30 DIAGNOSIS — K269 Duodenal ulcer, unspecified as acute or chronic, without hemorrhage or perforation: Secondary | ICD-10-CM

## 2016-11-30 DIAGNOSIS — K2210 Ulcer of esophagus without bleeding: Secondary | ICD-10-CM

## 2016-11-30 DIAGNOSIS — K449 Diaphragmatic hernia without obstruction or gangrene: Secondary | ICD-10-CM

## 2016-11-30 DIAGNOSIS — R112 Nausea with vomiting, unspecified: Secondary | ICD-10-CM

## 2016-11-30 HISTORY — PX: ESOPHAGOGASTRODUODENOSCOPY: SHX5428

## 2016-11-30 LAB — CBC
HEMATOCRIT: 43.5 % (ref 39.0–52.0)
Hemoglobin: 15.2 g/dL (ref 13.0–17.0)
MCH: 31.9 pg (ref 26.0–34.0)
MCHC: 34.9 g/dL (ref 30.0–36.0)
MCV: 91.2 fL (ref 78.0–100.0)
Platelets: 145 10*3/uL — ABNORMAL LOW (ref 150–400)
RBC: 4.77 MIL/uL (ref 4.22–5.81)
RDW: 13.3 % (ref 11.5–15.5)
WBC: 7.8 10*3/uL (ref 4.0–10.5)

## 2016-11-30 LAB — COMPREHENSIVE METABOLIC PANEL
ALBUMIN: 2.7 g/dL — AB (ref 3.5–5.0)
ALK PHOS: 53 U/L (ref 38–126)
ALT: 22 U/L (ref 17–63)
AST: 12 U/L — ABNORMAL LOW (ref 15–41)
Anion gap: 6 (ref 5–15)
BILIRUBIN TOTAL: 0.7 mg/dL (ref 0.3–1.2)
BUN: 15 mg/dL (ref 6–20)
CALCIUM: 8.8 mg/dL — AB (ref 8.9–10.3)
CO2: 24 mmol/L (ref 22–32)
Chloride: 104 mmol/L (ref 101–111)
Creatinine, Ser: 0.96 mg/dL (ref 0.61–1.24)
GFR calc Af Amer: >60 mL/min (ref >60)
GFR calc non Af Amer: >60 mL/min (ref >60)
Glucose, Bld: 104 mg/dL — ABNORMAL HIGH (ref 65–99)
POTASSIUM: 3.7 mmol/L (ref 3.5–5.1)
Sodium: 134 mmol/L — ABNORMAL LOW (ref 135–145)
TOTAL PROTEIN: 5.3 g/dL — AB (ref 6.5–8.1)

## 2016-11-30 LAB — GLUCOSE, CAPILLARY
GLUCOSE-CAPILLARY: 103 mg/dL — AB (ref 65–99)
GLUCOSE-CAPILLARY: 97 mg/dL (ref 65–99)
GLUCOSE-CAPILLARY: 99 mg/dL (ref 65–99)
Glucose-Capillary: 108 mg/dL — ABNORMAL HIGH (ref 65–99)
Glucose-Capillary: 81 mg/dL (ref 65–99)
Glucose-Capillary: 98 mg/dL (ref 65–99)
Glucose-Capillary: 195 mg/dL — ABNORMAL HIGH (ref 65–99)

## 2016-11-30 SURGERY — EGD (ESOPHAGOGASTRODUODENOSCOPY)
Anesthesia: Moderate Sedation

## 2016-11-30 MED ORDER — INSULIN ASPART 100 UNIT/ML ~~LOC~~ SOLN
0.0000 [IU] | Freq: Three times a day (TID) | SUBCUTANEOUS | Status: DC
  Administered 2016-12-01: 3 [IU] via SUBCUTANEOUS
  Administered 2016-12-01: 1 [IU] via SUBCUTANEOUS

## 2016-11-30 MED ORDER — SILVER SULFADIAZINE 1 % EX CREA
TOPICAL_CREAM | Freq: Two times a day (BID) | CUTANEOUS | Status: DC
  Administered 2016-11-30 – 2016-12-01 (×3): via TOPICAL
  Filled 2016-11-30: qty 50

## 2016-11-30 MED ORDER — SODIUM CHLORIDE 0.9 % IV SOLN: INTRAVENOUS | Status: DC

## 2016-11-30 MED ORDER — MEPERIDINE HCL 50 MG/ML IJ SOLN
INTRAMUSCULAR | Status: DC | PRN
  Administered 2016-11-30 (×2): 25 mg via INTRAVENOUS

## 2016-11-30 MED ORDER — IOPAMIDOL (ISOVUE-370) INJECTION 76%
100.0000 mL | Freq: Once | INTRAVENOUS | Status: AC | PRN
  Administered 2016-11-30: 100 mL via INTRAVENOUS

## 2016-11-30 MED ORDER — MEPERIDINE HCL 50 MG/ML IJ SOLN
INTRAMUSCULAR | Status: AC
  Filled 2016-11-30: qty 1

## 2016-11-30 MED ORDER — ENOXAPARIN SODIUM 40 MG/0.4ML ~~LOC~~ SOLN
40.0000 mg | SUBCUTANEOUS | Status: DC
  Administered 2016-11-30: 40 mg via SUBCUTANEOUS
  Filled 2016-11-30: qty 0.4

## 2016-11-30 MED ORDER — LIDOCAINE VISCOUS 2 % MT SOLN
OROMUCOSAL | Status: DC | PRN
  Administered 2016-11-30: 5 mL via OROMUCOSAL

## 2016-11-30 MED ORDER — LIDOCAINE VISCOUS 2 % MT SOLN
OROMUCOSAL | Status: AC
  Filled 2016-11-30: qty 15

## 2016-11-30 MED ORDER — MIDAZOLAM HCL 5 MG/5ML IJ SOLN
INTRAMUSCULAR | Status: DC | PRN
  Administered 2016-11-30: 2 mg via INTRAVENOUS

## 2016-11-30 MED ORDER — INSULIN ASPART 100 UNIT/ML ~~LOC~~ SOLN: 0.0000 [IU] | Freq: Every day | SUBCUTANEOUS | Status: DC

## 2016-11-30 MED ORDER — MIDAZOLAM HCL 5 MG/5ML IJ SOLN
INTRAMUSCULAR | Status: AC
  Filled 2016-11-30: qty 10

## 2016-11-30 NOTE — Progress Notes (Signed)
Patient feels much better. Has not had anymore nausea or vomiting. Epigastric pain has decreased. He is hungry. WBC down to normal. Serum amylase is also normal. Will proceed with diagnostic EGD. Patient is agreeable.

## 2016-11-30 NOTE — Op Note (Signed)
[facility] Patient Name: Adrian Cross Procedure Date: 11/30/2016 2:37 PM MRN: [id number] Date of Birth: 1945/11/18 Attending MD: Hildred Laser , MD CSN: [id number] Age: 71 Admit Type: Outpatient Procedure:                Upper GI endoscopy Indications:              Epigastric abdominal pain, Abnormal CT of the GI                            tract, Nausea with vomiting Providers:                Hildred Laser, MD, Otis Peak B. Sharon Seller, RN, Aram Candela Referring MD:             Reyne Dumas, MD Medicines:                Lidocaine spray, Meperidine 50 mg IV, Midazolam 3                            mg IV Complications:            No immediate complications. Estimated Blood Loss:     Estimated blood loss was minimal. Procedure:                Pre-Anesthesia Assessment:                           - Prior to the procedure, a History and Physical                            was performed, and patient medications and                            allergies were reviewed. The patient's tolerance of                            previous anesthesia was also reviewed. The risks                            and benefits of the procedure and the sedation                            options and risks were discussed with the patient.                            All questions were answered, and informed consent                            was obtained. Prior Anticoagulants: The patient                            last took previous NSAID medication 3 days prior to  the procedure. ASA Grade Assessment: III - A                            patient with severe systemic disease. After                            reviewing the risks and benefits, the patient was                            deemed in satisfactory condition to undergo the                            procedure.                           After obtaining informed consent, the endoscope was            passed under direct vision. Throughout the                            procedure, the patient's blood pressure, pulse, and                            oxygen saturations were monitored continuously. The                            EG-[id number] ([id number]) scope was introduced through the                            mouth, and advanced to the third part of duodenum.                            The upper GI endoscopy was accomplished without                            difficulty. The patient tolerated the procedure                            well. Scope In: 3:09:29 PM Scope Out: 3:15:13 PM Total Procedure Duration: 0 hours 5 minutes 44 seconds  Findings:      The proximal esophagus and mid esophagus were normal.      Two linear esophageal ulcers with no bleeding were found 30 to 37 cm       from the incisors. The largest lesion was 5 mm in largest dimension.      The Z-line was regular and was found 37 cm from the incisors.      A 2 cm hiatal hernia was present.      The entire examined stomach was normal.      Many non-bleeding cratered duodenal ulcers with no stigmata of bleeding       were found in the duodenal bulb, in the second portion of the duodenum       and in the third portion of the duodenum. The largest lesion was 12 mm       in largest dimension. Biopsies were taken with a cold forceps for  histology. Impression:               - Normal proximal esophagus and mid esophagus.                           - Non-bleeding esophageal ulcers.                           - Z-line regular, 37 cm from the incisors.                           - 2 cm hiatal hernia.                           - Normal stomach.                           - Multiple non-bleeding duodenal ulcers with no                            stigmata of bleeding. Biopsied.                           Comment: Multiple ulcers involving first second and                            third part of the duodenum. These findings could  be                            due to infection or ischemic injury. Moderate Sedation:      Moderate (conscious) sedation was administered by the endoscopy nurse       and supervised by the endoscopist. The following parameters were       monitored: oxygen saturation, heart rate, blood pressure, CO2       capnography and response to care. Total physician intraservice time was       12 minutes. Recommendation:           - Return patient to hospital ward for ongoing care.                           - Clear liquid diet today.                           - Continue present medications.                           - No aspirin, ibuprofen, naproxen, or other                            non-steroidal anti-inflammatory drugs.                           - Await pathology results.                           -- CTA abdomen and pelvis i.m. Procedure Code(s):        --- Professional ---  54098, Esophagogastroduodenoscopy, flexible,                            transoral; with biopsy, single or multiple                           99152, Moderate sedation services provided by the                            same physician or other qualified health care                            professional performing the diagnostic or                            therapeutic service that the sedation supports,                            requiring the presence of an independent trained                            observer to assist in the monitoring of the                            patient's level of consciousness and physiological                            status; initial 15 minutes of intraservice time,                            patient age 57 years or older Diagnosis Code(s):        --- Professional ---                           K22.10, Ulcer of esophagus without bleeding                           K44.9, Diaphragmatic hernia without obstruction or                            gangrene                            K26.9, Duodenal ulcer, unspecified as acute or                            chronic, without hemorrhage or perforation                           R10.13, Epigastric pain                           R11.2, Nausea with vomiting, unspecified                           R93.3, Abnormal findings on diagnostic imaging of  other parts of digestive tract CPT copyright 2016 [employer]. All rights reserved. The codes documented in this report are preliminary and upon coder review may  be revised to meet current compliance requirements. Hildred Laser, MD Hildred Laser, MD 11/30/2016 3:47:10 PM This report has been signed electronically. Number of Addenda: 0

## 2016-11-30 NOTE — Progress Notes (Signed)
Pt arrived back from Endo. No complaints of pain, just hungry. Will continue to monitor.
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
## 2016-11-30 NOTE — Progress Notes (Signed)
[facility] PROGRESS NOTE  Adrian Cross YNW:[id number] DOB: [birth year] DOA: 11/29/2016   PCP: Hilbert Corrigan, MD     Assessment/Plan: Active Problems:   Enteritis   Duodenitis   Nausea and vomiting in adult patient     71 year old male with a history of hypertension presented due to intractable nausea vomiting all night associated with epigastric pain. Symptoms started 2 days ago. Denied any hematemesis or melena. He was just released from [facility] yesterday for a wound on his right hand, which she sustained from a fall. Currently is a resident of [place] [facility]. Vomiting is described as projectile, nonbloody. Patient admitted found to have edema/inflammation of the hepatoduodenal ligament and anterior pararenal space associated with wall thickening of the duodenum. Duodenitis considered versus peptic ulcer disease  Assessment and plan    Enteritis/  Duodenitis Patient admitted for the above symptoms including intractable nausea vomiting CT scan revealed inflammation in the duodenal area Cannot rule out infection Patient has had no diarrhea Supportive therapy with antibiotics, IV fluids, antinausea medicine, empiric antibiotics such as Cipro and Flagyl, patient started on IV PPI as well GI has been consulted for possible EGD 9/13  Possible peptic ulcer disease No perforation seen  Started on IV PPI   Diabetes Insulin-dependent/check hemoglobin A1c Continue 70/30 and add sliding scale insulin  DVT prophylaxsis Lovenox  Code Status:  Full code   Family Communication: Discussed in detail with the patient, all imaging results, lab results explained to the patient   Disposition Plan: Anticipate discharge in one to 2 days      Consultants:  GI  Procedures:  EGD  Antibiotics: Anti-infectives    Start     Dose/Rate Route Frequency Ordered Stop   11/29/16 2300  metroNIDAZOLE (FLAGYL) IVPB 500 mg     500 mg 100 mL/hr over 60  Minutes Intravenous Every 8 hours 11/29/16 2104     11/29/16 2200  ciprofloxacin (CIPRO) IVPB 400 mg     400 mg 200 mL/hr over 60 Minutes Intravenous Every 12 hours 11/29/16 2104           HPI/Subjective: Patient feels that his nausea has improved since admission  Objective: Vitals:   11/29/16 1315 11/29/16 1954 11/29/16 2110 11/30/16 0603  BP: (!) 152/69  (!) 150/70 (!) 143/68  Pulse: (!) 56  63 (!) 54  Resp: 20  20 20   Temp: 98.7 F (37.1 C)  98.6 F (37 C) 98.5 F (36.9 C)  TempSrc: Oral  Oral Oral  SpO2: 98% 95% 100% 98%  Weight: 77.8 kg (171 lb 9.6 oz)     Height: 5\' 4"  (1.626 m)       Intake/Output Summary (Last 24 hours) at 11/30/16 1104 Last data filed at 11/30/16 0859  Gross per 24 hour  Intake          1843.33 ml  Output              500 ml  Net          1343.33 ml    Exam:  Examination:  General exam: Appears calm and comfortable  Respiratory system: Clear to auscultation. Respiratory effort normal. Cardiovascular system: S1 & S2 heard, RRR. No JVD, murmurs, rubs, gallops or clicks. No pedal edema. Gastrointestinal system: Abdomen is nondistended, soft and nontender. No organomegaly or masses felt. Normal bowel sounds heard. Central nervous system: Alert and oriented. No focal neurological deficits. Extremities: Symmetric 5 x 5 power. Skin:  No rashes, lesions or ulcers Psychiatry: Judgement and insight appear normal. Mood & affect appropriate.     Data Reviewed: I have personally reviewed following labs and imaging studies  Micro Results Recent Results (from the past 240 hour(s))  MRSA PCR Screening     Status: None   Collection Time: 11/29/16  1:59 PM  Result Value Ref Range Status   MRSA by PCR NEGATIVE NEGATIVE Final    Comment:        The GeneXpert MRSA Assay (FDA approved for NASAL specimens only), is one component of a comprehensive MRSA colonization surveillance program. It is not intended to diagnose MRSA infection nor to guide  or monitor treatment for MRSA infections.     Radiology Reports Dg Chest 2 View  Result Date: 11/29/2016 CLINICAL DATA:  Nausea vomiting and abdominal pain EXAM: CHEST  2 VIEW COMPARISON:  11/01/2013 FINDINGS: Normal heart size and mediastinal contours. No acute infiltrate or edema. No effusion or pneumothorax. No acute osseous findings. IMPRESSION: No evidence of active disease. Electronically Signed   By: Monte Fantasia M.D.   On: 11/29/2016 09:11   Ct Abdomen Pelvis W Contrast  Result Date: 11/29/2016 CLINICAL DATA:  Abdominal pain and nausea for 1 day. EXAM: CT ABDOMEN AND PELVIS WITH CONTRAST TECHNIQUE: Multidetector CT imaging of the abdomen and pelvis was performed using the standard protocol following bolus administration of intravenous contrast. CONTRAST:  183mL ISOVUE-300 IOPAMIDOL (ISOVUE-300) INJECTION 61% COMPARISON:  10/30/2013 FINDINGS: Lower chest:  Dependent atelectasis lower lobes. Hepatobiliary: No focal abnormality within the liver parenchyma. There is no evidence for gallstones, gallbladder wall thickening, or pericholecystic fluid. No intrahepatic or extrahepatic biliary dilation. Pancreas: No focal mass lesion. No dilatation of the main duct. No intraparenchymal cyst. No peripancreatic edema. Spleen: No splenomegaly. No focal mass lesion. Adrenals/Urinary Tract: No adrenal nodule or mass. Tiny hypodensity anterior right mid kidney is stable in compatible with a cortical cyst. Left kidney unremarkable. No evidence for hydroureter. The urinary bladder appears normal for the degree of distention. Stomach/Bowel: Mild circumferential wall thickening noted distal esophagus. Stomach unremarkable. Edema and irregular wall thickening is noted in the duodenum diffusely. There is fairly marked edema and inflammation along the entire length of the duodenum. Jejunum unremarkable. Distal small bowel is nondilated. The terminal ileum is normal. The appendix is normal. No gross colonic mass. No  colonic wall thickening. No substantial diverticular change. Vascular/Lymphatic: There is abdominal aortic atherosclerosis without aneurysm. Portal vein and superior mesenteric vein are patent. Celiac axis and SMA opacified normally. IMA appears patent. There is no gastrohepatic or hepatoduodenal ligament lymphadenopathy. No intraperitoneal or retroperitoneal lymphadenopathy. No pelvic sidewall lymphadenopathy. Reproductive: The prostate gland and seminal vesicles have normal imaging features. Other: Small volume free fluid identified at the inferior liver, in the right para colic gutter, and in the anatomic pelvis. There is some perirectal edema evident mild body wall edema is associated. Musculoskeletal: Bone windows reveal no worrisome lytic or sclerotic osseous lesions. IMPRESSION: 1. Prominent edema/inflammation in the hepatoduodenal ligament and anterior pararenal space. This is associated with edematous wall thickening diffusely in the duodenum (see image 45 series 6). Given appearance of duodenum, this is felt to be the etiology of the edema/fluid in the abdomen and pancreatitis/cholecystitis are considered less likely possibilities. Assuming duodenum as primary etiology, infectious/inflammatory duodenitis are considerations. Duodenal involvement is more diffuse than typically seen for peptic ulcer disease. There is no evidence for extraluminal gas or retroperitoneal abscess. Although major arterial and venous anatomy of the abdomen  opacifies normally, there is poor enhancement in the wall the duodenum which may be related to the edema although ischemia cannot be entirely excluded. Electronically Signed   By: Misty Stanley M.D.   On: 11/29/2016 09:41     CBC  Recent Labs Lab 11/29/16 0749 11/30/16 0720  WBC 11.9* 7.8  HGB 18.2* 15.2  HCT 51.1 43.5  PLT 166 145*  MCV 88.4 91.2  MCH 31.5 31.9  MCHC 35.6 34.9  RDW 13.2 13.3  LYMPHSABS 2.9  --   MONOABS 0.7  --   EOSABS 0.0  --   BASOSABS 0.0   --     Chemistries   Recent Labs Lab 11/29/16 0749 11/30/16 0720  NA 134* 134*  K 3.7 3.7  CL 96* 104  CO2 28 24  GLUCOSE 223* 104*  BUN 20 15  CREATININE 1.16 0.96  CALCIUM 9.6 8.8*  AST 21 12*  ALT 37 22  ALKPHOS 72 53  BILITOT 0.6 0.7   ------------------------------------------------------------------------------------------------------------------ estimated creatinine clearance is 68.4 mL/min (by C-G formula based on SCr of 0.96 mg/dL). ------------------------------------------------------------------------------------------------------------------  Recent Labs  11/29/16 1424  HGBA1C 10.1*   ------------------------------------------------------------------------------------------------------------------ No results for input(s): CHOL, HDL, LDLCALC, TRIG, CHOLHDL, LDLDIRECT in the last 72 hours. ------------------------------------------------------------------------------------------------------------------ No results for input(s): TSH, T4TOTAL, T3FREE, THYROIDAB in the last 72 hours.  Invalid input(s): FREET3 ------------------------------------------------------------------------------------------------------------------ No results for input(s): VITAMINB12, FOLATE, FERRITIN, TIBC, IRON, RETICCTPCT in the last 72 hours.  Coagulation profile No results for input(s): INR, PROTIME in the last 168 hours.  No results for input(s): DDIMER in the last 72 hours.  Cardiac Enzymes  Recent Labs Lab 11/29/16 0750  TROPONINI <0.03   ------------------------------------------------------------------------------------------------------------------ Invalid input(s): POCBNP   CBG:  Recent Labs Lab 11/29/16 1555 11/29/16 2009 11/30/16 0014 11/30/16 0518 11/30/16 0749  GLUCAP 200* 164* 103* 97 99       Studies: Dg Chest 2 View  Result Date: 11/29/2016 CLINICAL DATA:  Nausea vomiting and abdominal pain EXAM: CHEST  2 VIEW COMPARISON:  11/01/2013 FINDINGS:  Normal heart size and mediastinal contours. No acute infiltrate or edema. No effusion or pneumothorax. No acute osseous findings. IMPRESSION: No evidence of active disease. Electronically Signed   By: Monte Fantasia M.D.   On: 11/29/2016 09:11   Ct Abdomen Pelvis W Contrast  Result Date: 11/29/2016 CLINICAL DATA:  Abdominal pain and nausea for 1 day. EXAM: CT ABDOMEN AND PELVIS WITH CONTRAST TECHNIQUE: Multidetector CT imaging of the abdomen and pelvis was performed using the standard protocol following bolus administration of intravenous contrast. CONTRAST:  153mL ISOVUE-300 IOPAMIDOL (ISOVUE-300) INJECTION 61% COMPARISON:  10/30/2013 FINDINGS: Lower chest:  Dependent atelectasis lower lobes. Hepatobiliary: No focal abnormality within the liver parenchyma. There is no evidence for gallstones, gallbladder wall thickening, or pericholecystic fluid. No intrahepatic or extrahepatic biliary dilation. Pancreas: No focal mass lesion. No dilatation of the main duct. No intraparenchymal cyst. No peripancreatic edema. Spleen: No splenomegaly. No focal mass lesion. Adrenals/Urinary Tract: No adrenal nodule or mass. Tiny hypodensity anterior right mid kidney is stable in compatible with a cortical cyst. Left kidney unremarkable. No evidence for hydroureter. The urinary bladder appears normal for the degree of distention. Stomach/Bowel: Mild circumferential wall thickening noted distal esophagus. Stomach unremarkable. Edema and irregular wall thickening is noted in the duodenum diffusely. There is fairly marked edema and inflammation along the entire length of the duodenum. Jejunum unremarkable. Distal small bowel is nondilated. The terminal ileum is normal. The appendix is normal. No gross colonic mass. No  colonic wall thickening. No substantial diverticular change. Vascular/Lymphatic: There is abdominal aortic atherosclerosis without aneurysm. Portal vein and superior mesenteric vein are patent. Celiac axis and SMA  opacified normally. IMA appears patent. There is no gastrohepatic or hepatoduodenal ligament lymphadenopathy. No intraperitoneal or retroperitoneal lymphadenopathy. No pelvic sidewall lymphadenopathy. Reproductive: The prostate gland and seminal vesicles have normal imaging features. Other: Small volume free fluid identified at the inferior liver, in the right para colic gutter, and in the anatomic pelvis. There is some perirectal edema evident mild body wall edema is associated. Musculoskeletal: Bone windows reveal no worrisome lytic or sclerotic osseous lesions. IMPRESSION: 1. Prominent edema/inflammation in the hepatoduodenal ligament and anterior pararenal space. This is associated with edematous wall thickening diffusely in the duodenum (see image 45 series 6). Given appearance of duodenum, this is felt to be the etiology of the edema/fluid in the abdomen and pancreatitis/cholecystitis are considered less likely possibilities. Assuming duodenum as primary etiology, infectious/inflammatory duodenitis are considerations. Duodenal involvement is more diffuse than typically seen for peptic ulcer disease. There is no evidence for extraluminal gas or retroperitoneal abscess. Although major arterial and venous anatomy of the abdomen opacifies normally, there is poor enhancement in the wall the duodenum which may be related to the edema although ischemia cannot be entirely excluded. Electronically Signed   By: Misty Stanley M.D.   On: 11/29/2016 09:41      Lab Results  Component Value Date   HGBA1C 10.1 (H) 11/29/2016   HGBA1C 11.9 (H) 11/06/2013   Lab Results  Component Value Date   CREATININE 0.96 11/30/2016       Scheduled Meds: . insulin aspart  0-9 Units Subcutaneous Q4H  . insulin aspart protamine- aspart  3 Units Subcutaneous BID WC  . irbesartan  37.5 mg Oral Daily  . pantoprazole (PROTONIX) IV  40 mg Intravenous Q12H   Continuous Infusions: . sodium chloride 100 mL/hr at 11/29/16 2058   . ciprofloxacin Stopped (11/30/16 0941)  . metronidazole Stopped (11/30/16 0706)     LOS: 1 day    Time spent: >30 MINS    Reyne Dumas  [facility] Pager [phone]. If 7PM-7AM, please contact night-coverage at [email or web address], password [email or web address] 11/30/2016, 11:04 AM  LOS: 1 day

## 2016-11-30 NOTE — Progress Notes (Signed)
Inpatient Diabetes Program Recommendations  AACE/ADA: New Consensus Statement on Inpatient Glycemic Control (2015)  Target Ranges:  Prepandial:   less than 140 mg/dL      Peak postprandial:   less than 180 mg/dL (1-2 hours)      Critically ill patients:  140 - 180 mg/dL   Lab Results  Component Value Date   GLUCAP 81 11/30/2016   HGBA1C 10.1 (H) 11/29/2016    Review of Glycemic Control  Results for Adrian Cross, Adrian Cross (MRN [id number]) as of 11/30/2016 14:57  Ref. Range 11/30/2016 00:14 11/30/2016 05:18 11/30/2016 07:49 11/30/2016 11:30 11/30/2016 14:12  Glucose-Capillary Latest Ref Range: 65 - 99 mg/dL 103 (H) 97 99 108 (H) 81    Diabetes history: Type 2 Outpatient Diabetes medications: Glyburide 10mg  bid, Humalog 75/25 6 units bid Current orders for Inpatient glycemic control: Novolog 70/30 3 units bid, Novolog 0-9units q4h  Inpatient Diabetes Program Recommendations: Consider d/c 70/30 insulin while patient is in the hospital and patient is NPO.  Consider starting low dose basal Lantus when the patient is eating and the CBG are consistently above 180mg /dl.  Continue Novolog 0-9 units q4h as ordered.   Gentry Fitz, RN, BA, MHA, CDE Diabetes Coordinator Inpatient Diabetes Program  [phone] (Team Pager) [phone] ([place]) 11/30/2016 3:07 PM

## 2016-12-01 ENCOUNTER — Inpatient Hospital Stay (HOSPITAL_COMMUNITY): Payer: Medicare HMO

## 2016-12-01 ENCOUNTER — Encounter (HOSPITAL_COMMUNITY): Payer: Self-pay | Admitting: Internal Medicine

## 2016-12-01 DIAGNOSIS — Z79899 Other long term (current) drug therapy: Secondary | ICD-10-CM | POA: Diagnosis not present

## 2016-12-01 DIAGNOSIS — R2681 Unsteadiness on feet: Secondary | ICD-10-CM | POA: Diagnosis not present

## 2016-12-01 DIAGNOSIS — S299XXA Unspecified injury of thorax, initial encounter: Secondary | ICD-10-CM | POA: Diagnosis not present

## 2016-12-01 DIAGNOSIS — T23039A Burn of unspecified degree of unspecified multiple fingers (nail), not including thumb, initial encounter: Secondary | ICD-10-CM | POA: Diagnosis not present

## 2016-12-01 DIAGNOSIS — T3110 Burns involving 10-19% of body surface with 0% to 9% third degree burns: Secondary | ICD-10-CM | POA: Diagnosis not present

## 2016-12-01 DIAGNOSIS — R1013 Epigastric pain: Secondary | ICD-10-CM | POA: Diagnosis not present

## 2016-12-01 DIAGNOSIS — S14105D Unspecified injury at C5 level of cervical spinal cord, subsequent encounter: Secondary | ICD-10-CM | POA: Diagnosis not present

## 2016-12-01 DIAGNOSIS — S3992XA Unspecified injury of lower back, initial encounter: Secondary | ICD-10-CM | POA: Diagnosis not present

## 2016-12-01 DIAGNOSIS — K2210 Ulcer of esophagus without bleeding: Secondary | ICD-10-CM | POA: Diagnosis not present

## 2016-12-01 DIAGNOSIS — E1140 Type 2 diabetes mellitus with diabetic neuropathy, unspecified: Secondary | ICD-10-CM | POA: Diagnosis not present

## 2016-12-01 DIAGNOSIS — K269 Duodenal ulcer, unspecified as acute or chronic, without hemorrhage or perforation: Secondary | ICD-10-CM | POA: Diagnosis not present

## 2016-12-01 DIAGNOSIS — K449 Diaphragmatic hernia without obstruction or gangrene: Secondary | ICD-10-CM | POA: Diagnosis not present

## 2016-12-01 DIAGNOSIS — K529 Noninfective gastroenteritis and colitis, unspecified: Secondary | ICD-10-CM | POA: Diagnosis not present

## 2016-12-01 DIAGNOSIS — T23239A Burn of second degree of unspecified multiple fingers (nail), not including thumb, initial encounter: Secondary | ICD-10-CM | POA: Diagnosis not present

## 2016-12-01 DIAGNOSIS — R279 Unspecified lack of coordination: Secondary | ICD-10-CM | POA: Diagnosis not present

## 2016-12-01 DIAGNOSIS — K2980 Duodenitis without bleeding: Secondary | ICD-10-CM | POA: Diagnosis not present

## 2016-12-01 DIAGNOSIS — M6281 Muscle weakness (generalized): Secondary | ICD-10-CM | POA: Diagnosis not present

## 2016-12-01 DIAGNOSIS — S199XXA Unspecified injury of neck, initial encounter: Secondary | ICD-10-CM | POA: Diagnosis not present

## 2016-12-01 DIAGNOSIS — W19XXXD Unspecified fall, subsequent encounter: Secondary | ICD-10-CM | POA: Diagnosis not present

## 2016-12-01 DIAGNOSIS — R112 Nausea with vomiting, unspecified: Secondary | ICD-10-CM | POA: Diagnosis not present

## 2016-12-01 DIAGNOSIS — A0839 Other viral enteritis: Secondary | ICD-10-CM | POA: Diagnosis not present

## 2016-12-01 DIAGNOSIS — T304 Corrosion of unspecified body region, unspecified degree: Secondary | ICD-10-CM | POA: Diagnosis not present

## 2016-12-01 DIAGNOSIS — R278 Other lack of coordination: Secondary | ICD-10-CM | POA: Diagnosis not present

## 2016-12-01 DIAGNOSIS — M4712 Other spondylosis with myelopathy, cervical region: Secondary | ICD-10-CM | POA: Diagnosis not present

## 2016-12-01 DIAGNOSIS — I10 Essential (primary) hypertension: Secondary | ICD-10-CM | POA: Diagnosis not present

## 2016-12-01 DIAGNOSIS — Z7401 Bed confinement status: Secondary | ICD-10-CM | POA: Diagnosis not present

## 2016-12-01 DIAGNOSIS — R933 Abnormal findings on diagnostic imaging of other parts of digestive tract: Secondary | ICD-10-CM | POA: Diagnosis not present

## 2016-12-01 LAB — CBC
HCT: 41.2 % (ref 39.0–52.0)
Hemoglobin: 14.4 g/dL (ref 13.0–17.0)
MCH: 31.9 pg (ref 26.0–34.0)
MCHC: 35 g/dL (ref 30.0–36.0)
MCV: 91.4 fL (ref 78.0–100.0)
PLATELETS: 149 10*3/uL — AB (ref 150–400)
RBC: 4.51 MIL/uL (ref 4.22–5.81)
RDW: 13.3 % (ref 11.5–15.5)
WBC: 5.5 10*3/uL (ref 4.0–10.5)

## 2016-12-01 LAB — BASIC METABOLIC PANEL
ANION GAP: 6 (ref 5–15)
BUN: 10 mg/dL (ref 6–20)
CALCIUM: 8.8 mg/dL — AB (ref 8.9–10.3)
CO2: 23 mmol/L (ref 22–32)
Chloride: 106 mmol/L (ref 101–111)
Creatinine, Ser: 0.98 mg/dL (ref 0.61–1.24)
GFR calc Af Amer: >60 mL/min (ref >60)
GLUCOSE: 168 mg/dL — AB (ref 65–99)
Potassium: 3.7 mmol/L (ref 3.5–5.1)
SODIUM: 135 mmol/L (ref 135–145)

## 2016-12-01 LAB — GLUCOSE, CAPILLARY
GLUCOSE-CAPILLARY: 145 mg/dL — AB (ref 65–99)
Glucose-Capillary: 232 mg/dL — ABNORMAL HIGH (ref 65–99)

## 2016-12-01 MED ORDER — METRONIDAZOLE 500 MG PO TABS: 500.0000 mg | ORAL_TABLET | Freq: Three times a day (TID) | ORAL | 0 refills | Status: AC

## 2016-12-01 MED ORDER — GLYBURIDE 5 MG PO TABS: 5.0000 mg | ORAL_TABLET | Freq: Two times a day (BID) | ORAL | 0 refills | Status: DC

## 2016-12-01 MED ORDER — INSULIN LISPRO PROT & LISPRO (75-25 MIX) 100 UNIT/ML KWIKPEN: 3.0000 [IU] | PEN_INJECTOR | Freq: Two times a day (BID) | SUBCUTANEOUS | 1 refills | Status: DC

## 2016-12-01 MED ORDER — FUROSEMIDE 10 MG/ML IJ SOLN: 40.0000 mg | Freq: Once | INTRAMUSCULAR | Status: DC

## 2016-12-01 MED ORDER — CIPROFLOXACIN HCL 500 MG PO TABS: 500.0000 mg | ORAL_TABLET | Freq: Two times a day (BID) | ORAL | 0 refills | Status: AC

## 2016-12-01 MED ORDER — METHOCARBAMOL 500 MG PO TABS: 500.0000 mg | ORAL_TABLET | Freq: Four times a day (QID) | ORAL | 1 refills | Status: AC | PRN

## 2016-12-01 MED ORDER — FUROSEMIDE 10 MG/ML IJ SOLN
60.0000 mg | Freq: Once | INTRAMUSCULAR | Status: AC
  Administered 2016-12-01: 60 mg via INTRAVENOUS
  Filled 2016-12-01: qty 6

## 2016-12-01 MED ORDER — PANTOPRAZOLE SODIUM 40 MG PO TBEC: 40.0000 mg | DELAYED_RELEASE_TABLET | Freq: Two times a day (BID) | ORAL | 1 refills | Status: AC

## 2016-12-01 NOTE — Evaluation (Signed)
Physical Therapy Evaluation Patient Details Name: Adrian Cross MRN: [id number] DOB: 22-Oct-1945 Today's Date: 12/01/2016   History of Present Illness  Adrian Cross is a 70yo black male who comes to [facility] on 9/12 after 2 D epigastric pain, N and V now resolved. He was at [facility] x1D, after a fall at home and inaility to move his body, on the ground 30-45 minutes. He was subsequently at [facility], then DC to [facility]. He reports baseline community distance ambulation, fully independent, but has been weak since fall, unable to walk household distances while at [facility] last week.   Clinical Impression  Pt admitted with above diagnosis. Pt currently with functional limitations due to the deficits listed below (see "PT Problem List"). Upon entry, the patient is received semirecumbent in bed, no family/caregiver present. The pt is awake and agreeable to participate. No acute distress noted at this time. The pt is alert and oriented x3, pleasant, conversational, and following simple and multi-step commands consistently. Manual muscle testing screening reveals global strength deficits as detailed below. Pt remains globally weak, unable to AMB meaningful distances. Pt will benefit from skilled PT intervention to increase independence and safety with basic mobility in preparation for discharge to the venue listed below.       Follow Up Recommendations SNF    Equipment Recommendations  None recommended by PT    Recommendations for Other Services       Precautions / Restrictions Precautions Precautions: None Precaution Comments: recent fall  Restrictions Weight Bearing Restrictions: No      Mobility  Bed Mobility Overal bed mobility:  (deferred due to unsupported catheter attachment, then imaging enters room prior to attempt)                Transfers                    Ambulation/Gait                Stairs            Wheelchair Mobility     Modified Rankin (Stroke Patients Only)       Balance                                             Pertinent Vitals/Pain Pain Assessment: No/denies pain    Home Living Family/patient expects to be discharged to:: Private residence Living Arrangements: Alone Available Help at Discharge: Family;Available PRN/intermittently Type of Home: Mobile home Home Access: Ramped entrance     Home Layout: One level Home Equipment: None      Prior Function Level of Independence: Independent         Comments: reports some antalgia of gait at baseline related to calluses on feet      Hand Dominance   Dominant Hand: Left    Extremity/Trunk Assessment   Upper Extremity Assessment Upper Extremity Assessment: Generalized weakness (4/5 grossly bilat, sensation intact)    Lower Extremity Assessment Lower Extremity Assessment: Generalized weakness (4+/5 grossly bilat; sensation intact)       Communication   Communication: No difficulties  Cognition Arousal/Alertness: Awake/alert Behavior During Therapy: WFL for tasks assessed/performed Overall Cognitive Status: Within Functional Limits for tasks assessed  General Comments      Exercises     Assessment/Plan    PT Assessment Patient needs continued PT services  PT Problem List Decreased strength;Decreased range of motion;Decreased mobility       PT Treatment Interventions Gait training;Stair training;Functional mobility training;Therapeutic activities;Therapeutic exercise;Balance training    PT Goals (Current goals can be found in the Care Plan section)  Acute Rehab PT Goals Patient Stated Goal: regain strength and mobility PT Goal Formulation: With patient Time For Goal Achievement: 12/15/16 Potential to Achieve Goals: Good    Frequency Min 2X/week   Barriers to discharge Decreased caregiver support      Co-evaluation                AM-PAC PT "6 Clicks" Daily Activity  Outcome Measure Difficulty turning over in bed (including adjusting bedclothes, sheets and blankets)?: A Little Difficulty moving from lying on back to sitting on the side of the bed? : A Little Difficulty sitting down on and standing up from a chair with arms (e.g., wheelchair, bedside commode, etc,.)?: A Lot Help needed moving to and from a bed to chair (including a wheelchair)?: A Little Help needed walking in hospital room?: A Lot Help needed climbing 3-5 steps with a railing? : Total 6 Click Score: 14    End of Session   Activity Tolerance: Patient tolerated treatment well;No increased pain Patient left: in bed Nurse Communication: Mobility status PT Visit Diagnosis: Muscle weakness (generalized) (M62.81);History of falling (Z91.81);Difficulty in walking, not elsewhere classified (R26.2)    Time: 2409-7353 PT Time Calculation (min) (ACUTE ONLY): 17 min   Charges:   PT Evaluation $PT Eval Moderate Complexity: 1 Mod     PT G Codes:       12:39 PM, 12-21-2016 Etta Grandchild, PT, DPT Physical Therapist - [place] [phone] (Office)    Ami Mally C 2016-12-21, 12:37 PM

## 2016-12-01 NOTE — Care Management Important Message (Signed)
Important Message  Patient Details  Name: Adrian Cross MRN: [id number] Date of Birth: 1945-05-03   Medicare Important Message Given:  Yes    Sherald Barge, RN 12/01/2016, 2:09 PM

## 2016-12-01 NOTE — Clinical Social Work Note (Signed)
Clinical Social Work Assessment  Patient Details  Name: Adrian Cross MRN: [id number] Date of Birth: May 03, 1945  Date of referral:  12/01/16               Reason for consult:  Discharge Planning                Permission sought to share information with:    Permission granted to share information::     Name::        Agency::  [place] [facility]  Relationship::     Contact Information:  Daughter, [name] [phone]  Housing/Transportation Living arrangements for the past 2 months:  [place] of Information:  Facility, Adult Children Patient Interpreter Needed:  None Criminal Activity/Legal Involvement Pertinent to Current Situation/Hospitalization:  No - Comment as needed Significant Relationships:  Adult Children Lives with:  Facility Resident, Self Do you feel safe going back to the place where you live?  Yes Need for family participation in patient care:  Yes (Comment)  Care giving concerns:  None identified at baseline. Facility resident.    Social Worker assessment / plan: Patient was at [facility] for one day before coming to [facility] for vomiting. Patient feeds himself, receives assistance with ADLs and is currently in a wheelchair. Patient is at [facility] for short term rehab only. Patient can return at discharge. Patient's daughter, [name], confirmed statements. She stated that family and patient desired a different facility as patient did not feel his current facility was clean. LCSW faxed clinicals to other facilities of interest. [name] advised that if patient was not accepted at another facility he would have to return to [facility]. She stated that patient could not return home until he was able to receive therapy and regain his ability to walk again.   Employment status:  [employer] information:  Managed Medicare PT Recommendations:  Not assessed at this time Information / Referral to community resources:  [place] ([employer] to daughter, [name] )  Patient/Family's Response to care: Family and patient would prefer a new SNF.   Patient/Family's Understanding of and Emotional Response to Diagnosis, Current Treatment, and Prognosis:  Family understands patient's diagnosis, treatment and prognosis.   Emotional Assessment Appearance:  Appears stated age Attitude/Demeanor/Rapport:    Affect (typically observed):  Accepting Orientation:  Oriented to Self, Oriented to Place, Oriented to  Time, Oriented to Situation Alcohol / Substance use:  Not Applicable Psych involvement (Current and /or in the community):  No (Comment)  Discharge Needs  Concerns to be addressed:  Discharge Planning Concerns Readmission within the last 30 days:  No Current discharge risk:  None Barriers to Discharge:  Other (wants a different facility. )   Ihor Gully, LCSW 12/01/2016, 9:56 AM
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
## 2016-12-01 NOTE — Progress Notes (Signed)
Patient IV removed, Telemetry removed, tolerated well. [facility] called for report. Sister notified as well.
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
## 2016-12-01 NOTE — Progress Notes (Addendum)
Inpatient Diabetes Program Recommendations  AACE/ADA: New Consensus Statement on Inpatient Glycemic Control (2015)  Target Ranges:  Prepandial:   less than 140 mg/dL      Peak postprandial:   less than 180 mg/dL (1-2 hours)      Critically ill patients:  140 - 180 mg/dL   Lab Results  Component Value Date   GLUCAP 232 (H) 12/01/2016   HGBA1C 10.1 (H) 11/29/2016    Review of Glycemic Control   Results for Adrian Cross, Adrian Cross (MRN [id number]) as of 12/01/2016 11:34  Ref. Range 11/30/2016 14:12 11/30/2016 16:40 11/30/2016 20:52 12/01/2016 07:32 12/01/2016 11:18  Glucose-Capillary Latest Ref Range: 65 - 99 mg/dL 81 98 195 (H) 145 (H) 232 (H)   Diabetes history: Type 2 Outpatient Diabetes medications: Glyburide 10mg  bid, Humalog 75/25 6 units bid Current orders for Inpatient glycemic control: Novolog 70/30 3 units bid, Novolog 0-9units q4h  Inpatient Diabetes Program Recommendations: Consider d/c 70/30 insulin while patient is in the hospital- it is very difficult to manage basal numbers and post prandial blood sugars.   Consider starting low dose basal Lantus 6 units qhs  (0.075unit/kg) - A1C 10.1% **likely safer for the patient  Continue Novolog 0-9 units tid, Novolog 0-5 qhs  Gentry Fitz, RN, [place], [place], CDE Diabetes Coordinator Inpatient Diabetes Program  [phone] (Team Pager) [phone] ([place]) 12/01/2016 11:50 AM

## 2016-12-01 NOTE — NC FL2 (Signed)
[place] LEVEL OF CARE SCREENING TOOL     IDENTIFICATION  Patient Name: Adrian Cross Birthdate: 1945/09/06 Sex: male Admission Date (Current Location): 11/29/2016  [facility] and [place] Number:  [employer] and Address:  [place] [address] [place]      Provider Number: [id number]  Attending Physician Name and Address:  Reyne Dumas, MD  Relative Name and Phone Number:       Current Level of Care: Hospital Recommended Level of Care:   Prior Approval Number:    Date Approved/Denied:   PASRR Number: [id number] ([id number])  Discharge Plan: SNF    Current Diagnoses: Patient Active Problem List   Diagnosis Date Noted  . Nausea and vomiting in adult patient   . Enteritis 11/29/2016  . Duodenitis 11/29/2016  . Ileus, postoperative ([place]) 11/06/2013  . Abdominal abscess 10/30/2013  . HTN (hypertension) 10/30/2013  . Diabetes mellitus ([place]) 10/30/2013  . Abdominal mass 10/30/2013    Orientation RESPIRATION BLADDER Height & Weight     Self, Time, Situation, Place  Normal Incontinent Weight: 171 lb 9.6 oz (77.8 kg) Height:  5\' 4"  (162.6 cm)  BEHAVIORAL SYMPTOMS/MOOD NEUROLOGICAL BOWEL NUTRITION STATUS      Continent Diet (see discharge summary)  AMBULATORY STATUS COMMUNICATION OF NEEDS Skin   Extensive Assist (currently in wheelchair. ) Verbally Other (Comment) (Right hand blistering and edema)                       Personal Care Assistance Level of Assistance  Bathing, Feeding, Dressing Bathing Assistance: Limited assistance Feeding assistance: Independent Dressing Assistance: Limited assistance     Functional Limitations Info  Sight, Hearing, Speech Sight Info: Adequate Hearing Info: Adequate Speech Info: Adequate    SPECIAL CARE FACTORS FREQUENCY  PT (By licensed PT)     PT Frequency: 5x/week               Contractures Contractures Info: Not present    Additional Factors Info   Code Status Code Status Info: Full Code             Current Medications (12/01/2016):  This is the current hospital active medication list Current Facility-Administered Medications  Medication Dose Route Frequency Provider Last Rate Last Dose  . 0.9 %  sodium chloride infusion   Intravenous Continuous Reyne Dumas, MD 100 mL/hr at 11/30/16 2123    . acetaminophen (TYLENOL) tablet 650 mg  650 mg Oral Q6H PRN Reyne Dumas, MD       Or  . acetaminophen (TYLENOL) suppository 650 mg  650 mg Rectal Q6H PRN Reyne Dumas, MD      . ciprofloxacin (CIPRO) IVPB 400 mg  400 mg Intravenous Q12H Rogene Houston, MD   Stopped at 12/01/16 0944  . enoxaparin (LOVENOX) injection 40 mg  40 mg Subcutaneous Q24H Reyne Dumas, MD   40 mg at 11/30/16 1648  . fentaNYL (SUBLIMAZE) injection 25 mcg  25 mcg Intravenous Q2H PRN Reyne Dumas, MD   25 mcg at 11/29/16 1451  . hydrALAZINE (APRESOLINE) injection 10 mg  10 mg Intravenous Q6H PRN Abrol, Ascencion Dike, MD      . insulin aspart (novoLOG) injection 0-5 Units  0-5 Units Subcutaneous QHS Opyd, Timothy S, MD      . insulin aspart (novoLOG) injection 0-9 Units  0-9 Units Subcutaneous TID WC Opyd, Ilene Qua, MD   1 Units at 12/01/16 0845  . insulin aspart protamine- aspart (NOVOLOG  MIX 70/30) injection 3 Units  3 Units Subcutaneous BID WC Reyne Dumas, MD   3 Units at 12/01/16 0845  . irbesartan (AVAPRO) tablet 37.5 mg  37.5 mg Oral Daily Reyne Dumas, MD   37.5 mg at 12/01/16 0845  . levalbuterol (XOPENEX) nebulizer solution 0.63 mg  0.63 mg Nebulization Q6H PRN Reyne Dumas, MD      . metroNIDAZOLE (FLAGYL) IVPB 500 mg  500 mg Intravenous Q8H Rehman, Mechele Dawley, MD   Stopped at 12/01/16 0800  . pantoprazole (PROTONIX) injection 40 mg  40 mg Intravenous Q12H Reyne Dumas, MD   40 mg at 12/01/16 0844  . silver sulfADIAZINE (SILVADENE) 1 % cream   Topical BID Reyne Dumas, MD         Discharge Medications: Please see discharge summary for a list of discharge  medications.  Relevant Imaging Results:  Relevant Lab Results:   Additional Information SSN [id number].   Dwight Burdo, Clydene Pugh, LCSW

## 2016-12-03 DIAGNOSIS — M4712 Other spondylosis with myelopathy, cervical region: Secondary | ICD-10-CM | POA: Diagnosis not present

## 2016-12-03 DIAGNOSIS — K269 Duodenal ulcer, unspecified as acute or chronic, without hemorrhage or perforation: Secondary | ICD-10-CM | POA: Diagnosis not present

## 2016-12-03 DIAGNOSIS — I10 Essential (primary) hypertension: Secondary | ICD-10-CM | POA: Diagnosis not present

## 2016-12-03 DIAGNOSIS — K529 Noninfective gastroenteritis and colitis, unspecified: Secondary | ICD-10-CM | POA: Diagnosis not present

## 2016-12-04 DIAGNOSIS — Z79899 Other long term (current) drug therapy: Secondary | ICD-10-CM | POA: Diagnosis not present

## 2016-12-04 LAB — H PYLORI, IGM, IGG, IGA AB
H PYLORI IGG: 0.96 {index_val} — AB (ref 0.00–0.79)
H. Pylogi, Iga Abs: <9 units (ref 0.0–8.9)
H. Pylogi, Igm Abs: <9 units (ref 0.0–8.9)

## 2016-12-05 ENCOUNTER — Encounter (HOSPITAL_COMMUNITY): Payer: Self-pay | Admitting: Internal Medicine

## 2016-12-07 ENCOUNTER — Encounter (INDEPENDENT_AMBULATORY_CARE_PROVIDER_SITE_OTHER): Payer: Self-pay | Admitting: Internal Medicine

## 2016-12-07 NOTE — Progress Notes (Signed)
Patient was given an appointment for 12/28/16 at 2:30pm with Deberah Castle, NP.   A letter was mailed to the patient.

## 2016-12-11 DIAGNOSIS — T23239A Burn of second degree of unspecified multiple fingers (nail), not including thumb, initial encounter: Secondary | ICD-10-CM | POA: Diagnosis not present

## 2016-12-11 DIAGNOSIS — T23039A Burn of unspecified degree of unspecified multiple fingers (nail), not including thumb, initial encounter: Secondary | ICD-10-CM | POA: Diagnosis not present

## 2016-12-11 DIAGNOSIS — K529 Noninfective gastroenteritis and colitis, unspecified: Secondary | ICD-10-CM | POA: Diagnosis not present

## 2016-12-11 DIAGNOSIS — E1140 Type 2 diabetes mellitus with diabetic neuropathy, unspecified: Secondary | ICD-10-CM | POA: Diagnosis not present

## 2016-12-11 DIAGNOSIS — K269 Duodenal ulcer, unspecified as acute or chronic, without hemorrhage or perforation: Secondary | ICD-10-CM | POA: Diagnosis not present

## 2016-12-11 DIAGNOSIS — T304 Corrosion of unspecified body region, unspecified degree: Secondary | ICD-10-CM | POA: Diagnosis not present

## 2016-12-11 DIAGNOSIS — I10 Essential (primary) hypertension: Secondary | ICD-10-CM | POA: Diagnosis not present

## 2016-12-11 DIAGNOSIS — T3110 Burns involving 10-19% of body surface with 0% to 9% third degree burns: Secondary | ICD-10-CM | POA: Diagnosis not present

## 2016-12-14 DIAGNOSIS — S14105D Unspecified injury at C5 level of cervical spinal cord, subsequent encounter: Secondary | ICD-10-CM | POA: Diagnosis not present

## 2016-12-14 DIAGNOSIS — M4712 Other spondylosis with myelopathy, cervical region: Secondary | ICD-10-CM | POA: Diagnosis not present

## 2016-12-18 DIAGNOSIS — T23039A Burn of unspecified degree of unspecified multiple fingers (nail), not including thumb, initial encounter: Secondary | ICD-10-CM | POA: Diagnosis not present

## 2016-12-18 DIAGNOSIS — T3110 Burns involving 10-19% of body surface with 0% to 9% third degree burns: Secondary | ICD-10-CM | POA: Diagnosis not present

## 2016-12-18 DIAGNOSIS — K269 Duodenal ulcer, unspecified as acute or chronic, without hemorrhage or perforation: Secondary | ICD-10-CM | POA: Diagnosis not present

## 2016-12-18 DIAGNOSIS — K529 Noninfective gastroenteritis and colitis, unspecified: Secondary | ICD-10-CM | POA: Diagnosis not present

## 2016-12-18 DIAGNOSIS — E1140 Type 2 diabetes mellitus with diabetic neuropathy, unspecified: Secondary | ICD-10-CM | POA: Diagnosis not present

## 2016-12-22 DIAGNOSIS — T23039A Burn of unspecified degree of unspecified multiple fingers (nail), not including thumb, initial encounter: Secondary | ICD-10-CM | POA: Diagnosis not present

## 2016-12-22 DIAGNOSIS — K269 Duodenal ulcer, unspecified as acute or chronic, without hemorrhage or perforation: Secondary | ICD-10-CM | POA: Diagnosis not present

## 2016-12-22 DIAGNOSIS — E1140 Type 2 diabetes mellitus with diabetic neuropathy, unspecified: Secondary | ICD-10-CM | POA: Diagnosis not present

## 2016-12-22 DIAGNOSIS — K529 Noninfective gastroenteritis and colitis, unspecified: Secondary | ICD-10-CM | POA: Diagnosis not present

## 2016-12-26 DIAGNOSIS — K269 Duodenal ulcer, unspecified as acute or chronic, without hemorrhage or perforation: Secondary | ICD-10-CM | POA: Diagnosis not present

## 2016-12-26 DIAGNOSIS — T3110 Burns involving 10-19% of body surface with 0% to 9% third degree burns: Secondary | ICD-10-CM | POA: Diagnosis not present

## 2016-12-26 DIAGNOSIS — Y9273 Farm field as the place of occurrence of the external cause: Secondary | ICD-10-CM | POA: Diagnosis not present

## 2016-12-26 DIAGNOSIS — E1140 Type 2 diabetes mellitus with diabetic neuropathy, unspecified: Secondary | ICD-10-CM | POA: Diagnosis not present

## 2016-12-26 DIAGNOSIS — R2681 Unsteadiness on feet: Secondary | ICD-10-CM | POA: Diagnosis not present

## 2016-12-26 DIAGNOSIS — M4712 Other spondylosis with myelopathy, cervical region: Secondary | ICD-10-CM | POA: Diagnosis not present

## 2016-12-26 DIAGNOSIS — T23231D Burn of second degree of multiple right fingers (nail), not including thumb, subsequent encounter: Secondary | ICD-10-CM | POA: Diagnosis not present

## 2016-12-26 DIAGNOSIS — M6281 Muscle weakness (generalized): Secondary | ICD-10-CM | POA: Diagnosis not present

## 2016-12-26 DIAGNOSIS — I10 Essential (primary) hypertension: Secondary | ICD-10-CM | POA: Diagnosis not present

## 2016-12-27 DIAGNOSIS — M6281 Muscle weakness (generalized): Secondary | ICD-10-CM | POA: Diagnosis not present

## 2016-12-27 DIAGNOSIS — I10 Essential (primary) hypertension: Secondary | ICD-10-CM | POA: Diagnosis not present

## 2016-12-27 DIAGNOSIS — E1140 Type 2 diabetes mellitus with diabetic neuropathy, unspecified: Secondary | ICD-10-CM | POA: Diagnosis not present

## 2016-12-27 DIAGNOSIS — M4712 Other spondylosis with myelopathy, cervical region: Secondary | ICD-10-CM | POA: Diagnosis not present

## 2016-12-27 DIAGNOSIS — T3110 Burns involving 10-19% of body surface with 0% to 9% third degree burns: Secondary | ICD-10-CM | POA: Diagnosis not present

## 2016-12-27 DIAGNOSIS — K269 Duodenal ulcer, unspecified as acute or chronic, without hemorrhage or perforation: Secondary | ICD-10-CM | POA: Diagnosis not present

## 2016-12-27 DIAGNOSIS — Y9273 Farm field as the place of occurrence of the external cause: Secondary | ICD-10-CM | POA: Diagnosis not present

## 2016-12-27 DIAGNOSIS — T23231D Burn of second degree of multiple right fingers (nail), not including thumb, subsequent encounter: Secondary | ICD-10-CM | POA: Diagnosis not present

## 2016-12-27 DIAGNOSIS — R2681 Unsteadiness on feet: Secondary | ICD-10-CM | POA: Diagnosis not present

## 2016-12-28 ENCOUNTER — Ambulatory Visit (INDEPENDENT_AMBULATORY_CARE_PROVIDER_SITE_OTHER): Payer: Medicare HMO | Admitting: Internal Medicine

## 2017-01-02 DIAGNOSIS — M4712 Other spondylosis with myelopathy, cervical region: Secondary | ICD-10-CM | POA: Diagnosis not present

## 2017-01-02 DIAGNOSIS — T23231D Burn of second degree of multiple right fingers (nail), not including thumb, subsequent encounter: Secondary | ICD-10-CM | POA: Diagnosis not present

## 2017-01-02 DIAGNOSIS — I10 Essential (primary) hypertension: Secondary | ICD-10-CM | POA: Diagnosis not present

## 2017-01-02 DIAGNOSIS — M6281 Muscle weakness (generalized): Secondary | ICD-10-CM | POA: Diagnosis not present

## 2017-01-02 DIAGNOSIS — K269 Duodenal ulcer, unspecified as acute or chronic, without hemorrhage or perforation: Secondary | ICD-10-CM | POA: Diagnosis not present

## 2017-01-02 DIAGNOSIS — T3110 Burns involving 10-19% of body surface with 0% to 9% third degree burns: Secondary | ICD-10-CM | POA: Diagnosis not present

## 2017-01-02 DIAGNOSIS — Y9273 Farm field as the place of occurrence of the external cause: Secondary | ICD-10-CM | POA: Diagnosis not present

## 2017-01-02 DIAGNOSIS — R2681 Unsteadiness on feet: Secondary | ICD-10-CM | POA: Diagnosis not present

## 2017-01-02 DIAGNOSIS — E1140 Type 2 diabetes mellitus with diabetic neuropathy, unspecified: Secondary | ICD-10-CM | POA: Diagnosis not present

## 2017-01-03 DIAGNOSIS — M6281 Muscle weakness (generalized): Secondary | ICD-10-CM | POA: Diagnosis not present

## 2017-01-03 DIAGNOSIS — M4712 Other spondylosis with myelopathy, cervical region: Secondary | ICD-10-CM | POA: Diagnosis not present

## 2017-01-03 DIAGNOSIS — T23231D Burn of second degree of multiple right fingers (nail), not including thumb, subsequent encounter: Secondary | ICD-10-CM | POA: Diagnosis not present

## 2017-01-03 DIAGNOSIS — E1140 Type 2 diabetes mellitus with diabetic neuropathy, unspecified: Secondary | ICD-10-CM | POA: Diagnosis not present

## 2017-01-03 DIAGNOSIS — T3110 Burns involving 10-19% of body surface with 0% to 9% third degree burns: Secondary | ICD-10-CM | POA: Diagnosis not present

## 2017-01-03 DIAGNOSIS — Y9273 Farm field as the place of occurrence of the external cause: Secondary | ICD-10-CM | POA: Diagnosis not present

## 2017-01-03 DIAGNOSIS — I10 Essential (primary) hypertension: Secondary | ICD-10-CM | POA: Diagnosis not present

## 2017-01-03 DIAGNOSIS — R2681 Unsteadiness on feet: Secondary | ICD-10-CM | POA: Diagnosis not present

## 2017-01-03 DIAGNOSIS — K269 Duodenal ulcer, unspecified as acute or chronic, without hemorrhage or perforation: Secondary | ICD-10-CM | POA: Diagnosis not present

## 2017-01-04 DIAGNOSIS — Z01818 Encounter for other preprocedural examination: Secondary | ICD-10-CM | POA: Diagnosis not present

## 2017-01-04 DIAGNOSIS — I10 Essential (primary) hypertension: Secondary | ICD-10-CM | POA: Diagnosis not present

## 2017-01-04 DIAGNOSIS — F17200 Nicotine dependence, unspecified, uncomplicated: Secondary | ICD-10-CM | POA: Diagnosis not present

## 2017-01-04 DIAGNOSIS — E119 Type 2 diabetes mellitus without complications: Secondary | ICD-10-CM | POA: Diagnosis not present

## 2017-01-04 DIAGNOSIS — M4712 Other spondylosis with myelopathy, cervical region: Secondary | ICD-10-CM | POA: Diagnosis not present

## 2017-01-04 DIAGNOSIS — Z794 Long term (current) use of insulin: Secondary | ICD-10-CM | POA: Diagnosis not present

## 2017-01-04 DIAGNOSIS — R918 Other nonspecific abnormal finding of lung field: Secondary | ICD-10-CM | POA: Diagnosis not present

## 2017-01-05 DIAGNOSIS — Y9273 Farm field as the place of occurrence of the external cause: Secondary | ICD-10-CM | POA: Diagnosis not present

## 2017-01-05 DIAGNOSIS — K269 Duodenal ulcer, unspecified as acute or chronic, without hemorrhage or perforation: Secondary | ICD-10-CM | POA: Diagnosis not present

## 2017-01-05 DIAGNOSIS — T23231D Burn of second degree of multiple right fingers (nail), not including thumb, subsequent encounter: Secondary | ICD-10-CM | POA: Diagnosis not present

## 2017-01-05 DIAGNOSIS — T3110 Burns involving 10-19% of body surface with 0% to 9% third degree burns: Secondary | ICD-10-CM | POA: Diagnosis not present

## 2017-01-05 DIAGNOSIS — I10 Essential (primary) hypertension: Secondary | ICD-10-CM | POA: Diagnosis not present

## 2017-01-05 DIAGNOSIS — M6281 Muscle weakness (generalized): Secondary | ICD-10-CM | POA: Diagnosis not present

## 2017-01-05 DIAGNOSIS — R2681 Unsteadiness on feet: Secondary | ICD-10-CM | POA: Diagnosis not present

## 2017-01-05 DIAGNOSIS — M4712 Other spondylosis with myelopathy, cervical region: Secondary | ICD-10-CM | POA: Diagnosis not present

## 2017-01-05 DIAGNOSIS — E1140 Type 2 diabetes mellitus with diabetic neuropathy, unspecified: Secondary | ICD-10-CM | POA: Diagnosis not present

## 2017-01-08 DIAGNOSIS — S4991XA Unspecified injury of right shoulder and upper arm, initial encounter: Secondary | ICD-10-CM | POA: Diagnosis not present

## 2017-01-08 DIAGNOSIS — E1165 Type 2 diabetes mellitus with hyperglycemia: Secondary | ICD-10-CM | POA: Diagnosis not present

## 2017-01-08 DIAGNOSIS — M4712 Other spondylosis with myelopathy, cervical region: Secondary | ICD-10-CM | POA: Diagnosis not present

## 2017-01-08 DIAGNOSIS — F17210 Nicotine dependence, cigarettes, uncomplicated: Secondary | ICD-10-CM | POA: Diagnosis not present

## 2017-01-08 DIAGNOSIS — S4992XA Unspecified injury of left shoulder and upper arm, initial encounter: Secondary | ICD-10-CM | POA: Diagnosis not present

## 2017-01-08 DIAGNOSIS — I10 Essential (primary) hypertension: Secondary | ICD-10-CM | POA: Diagnosis not present

## 2017-01-08 DIAGNOSIS — J440 Chronic obstructive pulmonary disease with acute lower respiratory infection: Secondary | ICD-10-CM | POA: Diagnosis not present

## 2017-01-08 DIAGNOSIS — M19011 Primary osteoarthritis, right shoulder: Secondary | ICD-10-CM | POA: Diagnosis not present

## 2017-01-08 DIAGNOSIS — M25511 Pain in right shoulder: Secondary | ICD-10-CM | POA: Diagnosis not present

## 2017-01-08 DIAGNOSIS — J209 Acute bronchitis, unspecified: Secondary | ICD-10-CM | POA: Diagnosis not present

## 2017-01-08 DIAGNOSIS — M5030 Other cervical disc degeneration, unspecified cervical region: Secondary | ICD-10-CM | POA: Diagnosis not present

## 2017-01-08 DIAGNOSIS — M6281 Muscle weakness (generalized): Secondary | ICD-10-CM | POA: Diagnosis not present

## 2017-01-08 DIAGNOSIS — Z9181 History of falling: Secondary | ICD-10-CM | POA: Diagnosis not present

## 2017-01-08 DIAGNOSIS — S14109A Unspecified injury at unspecified level of cervical spinal cord, initial encounter: Secondary | ICD-10-CM | POA: Diagnosis not present

## 2017-01-08 DIAGNOSIS — M7501 Adhesive capsulitis of right shoulder: Secondary | ICD-10-CM | POA: Diagnosis not present

## 2017-01-08 DIAGNOSIS — E119 Type 2 diabetes mellitus without complications: Secondary | ICD-10-CM | POA: Diagnosis not present

## 2017-01-08 DIAGNOSIS — F17200 Nicotine dependence, unspecified, uncomplicated: Secondary | ICD-10-CM | POA: Diagnosis not present

## 2017-01-08 DIAGNOSIS — M25512 Pain in left shoulder: Secondary | ICD-10-CM | POA: Diagnosis not present

## 2017-01-08 DIAGNOSIS — M19012 Primary osteoarthritis, left shoulder: Secondary | ICD-10-CM | POA: Diagnosis not present

## 2017-01-08 DIAGNOSIS — M1990 Unspecified osteoarthritis, unspecified site: Secondary | ICD-10-CM | POA: Diagnosis not present

## 2017-01-08 DIAGNOSIS — J449 Chronic obstructive pulmonary disease, unspecified: Secondary | ICD-10-CM | POA: Diagnosis not present

## 2017-01-08 DIAGNOSIS — M7502 Adhesive capsulitis of left shoulder: Secondary | ICD-10-CM | POA: Diagnosis not present

## 2017-01-08 DIAGNOSIS — Z23 Encounter for immunization: Secondary | ICD-10-CM | POA: Diagnosis not present

## 2017-01-08 DIAGNOSIS — J410 Simple chronic bronchitis: Secondary | ICD-10-CM | POA: Diagnosis not present

## 2017-01-08 DIAGNOSIS — M4322 Fusion of spine, cervical region: Secondary | ICD-10-CM | POA: Diagnosis not present

## 2017-01-08 HISTORY — PX: POSTERIOR FUSION CERVICAL SPINE: SUR628

## 2017-01-16 DIAGNOSIS — Z23 Encounter for immunization: Secondary | ICD-10-CM | POA: Diagnosis not present

## 2017-01-18 DIAGNOSIS — M7501 Adhesive capsulitis of right shoulder: Secondary | ICD-10-CM | POA: Diagnosis not present

## 2017-01-18 DIAGNOSIS — M19011 Primary osteoarthritis, right shoulder: Secondary | ICD-10-CM | POA: Diagnosis not present

## 2017-01-18 DIAGNOSIS — Z4789 Encounter for other orthopedic aftercare: Secondary | ICD-10-CM | POA: Diagnosis not present

## 2017-01-18 DIAGNOSIS — M19012 Primary osteoarthritis, left shoulder: Secondary | ICD-10-CM | POA: Diagnosis not present

## 2017-01-18 DIAGNOSIS — M4712 Other spondylosis with myelopathy, cervical region: Secondary | ICD-10-CM | POA: Diagnosis not present

## 2017-01-18 DIAGNOSIS — I10 Essential (primary) hypertension: Secondary | ICD-10-CM | POA: Diagnosis not present

## 2017-01-18 DIAGNOSIS — E119 Type 2 diabetes mellitus without complications: Secondary | ICD-10-CM | POA: Diagnosis not present

## 2017-01-18 DIAGNOSIS — Z794 Long term (current) use of insulin: Secondary | ICD-10-CM | POA: Diagnosis not present

## 2017-01-18 DIAGNOSIS — M7502 Adhesive capsulitis of left shoulder: Secondary | ICD-10-CM | POA: Diagnosis not present

## 2017-01-18 HISTORY — PX: PICC LINE INSERTION: CATH118290

## 2017-01-18 HISTORY — PX: INCISION AND DRAINAGE ABSCESS POSTERIOR CERVICALSPINE: SHX1793

## 2017-01-20 DIAGNOSIS — E119 Type 2 diabetes mellitus without complications: Secondary | ICD-10-CM | POA: Diagnosis not present

## 2017-01-20 DIAGNOSIS — M19011 Primary osteoarthritis, right shoulder: Secondary | ICD-10-CM | POA: Diagnosis not present

## 2017-01-20 DIAGNOSIS — M7501 Adhesive capsulitis of right shoulder: Secondary | ICD-10-CM | POA: Diagnosis not present

## 2017-01-20 DIAGNOSIS — I10 Essential (primary) hypertension: Secondary | ICD-10-CM | POA: Diagnosis not present

## 2017-01-20 DIAGNOSIS — M4712 Other spondylosis with myelopathy, cervical region: Secondary | ICD-10-CM | POA: Diagnosis not present

## 2017-01-20 DIAGNOSIS — Z4789 Encounter for other orthopedic aftercare: Secondary | ICD-10-CM | POA: Diagnosis not present

## 2017-01-20 DIAGNOSIS — M7502 Adhesive capsulitis of left shoulder: Secondary | ICD-10-CM | POA: Diagnosis not present

## 2017-01-20 DIAGNOSIS — M19012 Primary osteoarthritis, left shoulder: Secondary | ICD-10-CM | POA: Diagnosis not present

## 2017-01-20 DIAGNOSIS — Z794 Long term (current) use of insulin: Secondary | ICD-10-CM | POA: Diagnosis not present

## 2017-01-22 DIAGNOSIS — J9811 Atelectasis: Secondary | ICD-10-CM | POA: Diagnosis not present

## 2017-01-22 DIAGNOSIS — M47812 Spondylosis without myelopathy or radiculopathy, cervical region: Secondary | ICD-10-CM | POA: Diagnosis not present

## 2017-01-22 DIAGNOSIS — G061 Intraspinal abscess and granuloma: Secondary | ICD-10-CM | POA: Diagnosis not present

## 2017-01-22 DIAGNOSIS — T8144XA Sepsis following a procedure, initial encounter: Secondary | ICD-10-CM | POA: Diagnosis not present

## 2017-01-22 DIAGNOSIS — I10 Essential (primary) hypertension: Secondary | ICD-10-CM | POA: Diagnosis not present

## 2017-01-22 DIAGNOSIS — R531 Weakness: Secondary | ICD-10-CM | POA: Diagnosis not present

## 2017-01-22 DIAGNOSIS — R935 Abnormal findings on diagnostic imaging of other abdominal regions, including retroperitoneum: Secondary | ICD-10-CM | POA: Diagnosis not present

## 2017-01-22 DIAGNOSIS — M7502 Adhesive capsulitis of left shoulder: Secondary | ICD-10-CM | POA: Diagnosis not present

## 2017-01-22 DIAGNOSIS — M19011 Primary osteoarthritis, right shoulder: Secondary | ICD-10-CM | POA: Diagnosis not present

## 2017-01-22 DIAGNOSIS — R7881 Bacteremia: Secondary | ICD-10-CM | POA: Diagnosis not present

## 2017-01-22 DIAGNOSIS — E119 Type 2 diabetes mellitus without complications: Secondary | ICD-10-CM | POA: Diagnosis not present

## 2017-01-22 DIAGNOSIS — M19012 Primary osteoarthritis, left shoulder: Secondary | ICD-10-CM | POA: Diagnosis not present

## 2017-01-22 DIAGNOSIS — T8463XA Infection and inflammatory reaction due to internal fixation device of spine, initial encounter: Secondary | ICD-10-CM | POA: Diagnosis not present

## 2017-01-22 DIAGNOSIS — A4102 Sepsis due to Methicillin resistant Staphylococcus aureus: Secondary | ICD-10-CM | POA: Diagnosis not present

## 2017-01-22 DIAGNOSIS — M7501 Adhesive capsulitis of right shoulder: Secondary | ICD-10-CM | POA: Diagnosis not present

## 2017-01-22 DIAGNOSIS — A409 Streptococcal sepsis, unspecified: Secondary | ICD-10-CM | POA: Diagnosis not present

## 2017-01-22 DIAGNOSIS — R269 Unspecified abnormalities of gait and mobility: Secondary | ICD-10-CM | POA: Diagnosis not present

## 2017-01-22 DIAGNOSIS — M96842 Postprocedural seroma of a musculoskeletal structure following a musculoskeletal system procedure: Secondary | ICD-10-CM | POA: Diagnosis not present

## 2017-01-22 DIAGNOSIS — E1165 Type 2 diabetes mellitus with hyperglycemia: Secondary | ICD-10-CM | POA: Diagnosis not present

## 2017-01-22 DIAGNOSIS — Z981 Arthrodesis status: Secondary | ICD-10-CM | POA: Diagnosis not present

## 2017-01-22 DIAGNOSIS — Y838 Other surgical procedures as the cause of abnormal reaction of the patient, or of later complication, without mention of misadventure at the time of the procedure: Secondary | ICD-10-CM | POA: Diagnosis not present

## 2017-01-22 DIAGNOSIS — M1990 Unspecified osteoarthritis, unspecified site: Secondary | ICD-10-CM | POA: Diagnosis not present

## 2017-01-22 DIAGNOSIS — M6281 Muscle weakness (generalized): Secondary | ICD-10-CM | POA: Diagnosis not present

## 2017-01-22 DIAGNOSIS — R509 Fever, unspecified: Secondary | ICD-10-CM | POA: Diagnosis not present

## 2017-01-22 DIAGNOSIS — G9763 Postprocedural seroma of a nervous system organ or structure following a nervous system procedure: Secondary | ICD-10-CM | POA: Diagnosis not present

## 2017-01-22 DIAGNOSIS — Z794 Long term (current) use of insulin: Secondary | ICD-10-CM | POA: Diagnosis not present

## 2017-01-22 DIAGNOSIS — F17200 Nicotine dependence, unspecified, uncomplicated: Secondary | ICD-10-CM | POA: Diagnosis not present

## 2017-01-22 DIAGNOSIS — Z452 Encounter for adjustment and management of vascular access device: Secondary | ICD-10-CM | POA: Diagnosis not present

## 2017-01-22 DIAGNOSIS — Z9889 Other specified postprocedural states: Secondary | ICD-10-CM | POA: Diagnosis not present

## 2017-01-22 DIAGNOSIS — F99 Mental disorder, not otherwise specified: Secondary | ICD-10-CM | POA: Diagnosis not present

## 2017-01-22 DIAGNOSIS — M4802 Spinal stenosis, cervical region: Secondary | ICD-10-CM | POA: Diagnosis not present

## 2017-01-22 DIAGNOSIS — A4189 Other specified sepsis: Secondary | ICD-10-CM | POA: Diagnosis not present

## 2017-01-22 DIAGNOSIS — M542 Cervicalgia: Secondary | ICD-10-CM | POA: Diagnosis not present

## 2017-01-22 DIAGNOSIS — E869 Volume depletion, unspecified: Secondary | ICD-10-CM | POA: Diagnosis not present

## 2017-01-22 DIAGNOSIS — T8149XA Infection following a procedure, other surgical site, initial encounter: Secondary | ICD-10-CM | POA: Diagnosis not present

## 2017-01-22 DIAGNOSIS — M4712 Other spondylosis with myelopathy, cervical region: Secondary | ICD-10-CM | POA: Diagnosis not present

## 2017-01-22 DIAGNOSIS — T8141XA Infection following a procedure, superficial incisional surgical site, initial encounter: Secondary | ICD-10-CM | POA: Diagnosis not present

## 2017-01-22 DIAGNOSIS — R5381 Other malaise: Secondary | ICD-10-CM | POA: Diagnosis not present

## 2017-01-22 DIAGNOSIS — R000 Tachycardia, unspecified: Secondary | ICD-10-CM | POA: Diagnosis not present

## 2017-01-22 DIAGNOSIS — R410 Disorientation, unspecified: Secondary | ICD-10-CM | POA: Diagnosis not present

## 2017-01-22 DIAGNOSIS — E871 Hypo-osmolality and hyponatremia: Secondary | ICD-10-CM | POA: Diagnosis not present

## 2017-01-25 DIAGNOSIS — R7881 Bacteremia: Secondary | ICD-10-CM | POA: Diagnosis not present

## 2017-01-25 DIAGNOSIS — Y838 Other surgical procedures as the cause of abnormal reaction of the patient, or of later complication, without mention of misadventure at the time of the procedure: Secondary | ICD-10-CM | POA: Diagnosis not present

## 2017-01-25 DIAGNOSIS — M7502 Adhesive capsulitis of left shoulder: Secondary | ICD-10-CM | POA: Diagnosis not present

## 2017-01-25 DIAGNOSIS — M19011 Primary osteoarthritis, right shoulder: Secondary | ICD-10-CM | POA: Diagnosis not present

## 2017-01-25 DIAGNOSIS — F17200 Nicotine dependence, unspecified, uncomplicated: Secondary | ICD-10-CM | POA: Diagnosis not present

## 2017-01-25 DIAGNOSIS — I10 Essential (primary) hypertension: Secondary | ICD-10-CM | POA: Diagnosis not present

## 2017-01-25 DIAGNOSIS — E1165 Type 2 diabetes mellitus with hyperglycemia: Secondary | ICD-10-CM | POA: Diagnosis not present

## 2017-01-25 DIAGNOSIS — A4102 Sepsis due to Methicillin resistant Staphylococcus aureus: Secondary | ICD-10-CM | POA: Diagnosis not present

## 2017-01-25 DIAGNOSIS — Z452 Encounter for adjustment and management of vascular access device: Secondary | ICD-10-CM | POA: Diagnosis not present

## 2017-01-25 DIAGNOSIS — T8149XA Infection following a procedure, other surgical site, initial encounter: Secondary | ICD-10-CM | POA: Diagnosis not present

## 2017-01-25 DIAGNOSIS — T8141XA Infection following a procedure, superficial incisional surgical site, initial encounter: Secondary | ICD-10-CM | POA: Diagnosis not present

## 2017-01-25 DIAGNOSIS — M47812 Spondylosis without myelopathy or radiculopathy, cervical region: Secondary | ICD-10-CM | POA: Diagnosis not present

## 2017-01-25 DIAGNOSIS — E119 Type 2 diabetes mellitus without complications: Secondary | ICD-10-CM | POA: Diagnosis not present

## 2017-01-25 DIAGNOSIS — T8144XA Sepsis following a procedure, initial encounter: Secondary | ICD-10-CM | POA: Diagnosis not present

## 2017-01-25 DIAGNOSIS — M1990 Unspecified osteoarthritis, unspecified site: Secondary | ICD-10-CM | POA: Diagnosis not present

## 2017-01-25 DIAGNOSIS — M7501 Adhesive capsulitis of right shoulder: Secondary | ICD-10-CM | POA: Diagnosis not present

## 2017-01-25 DIAGNOSIS — Z981 Arthrodesis status: Secondary | ICD-10-CM | POA: Diagnosis not present

## 2017-01-25 DIAGNOSIS — M19012 Primary osteoarthritis, left shoulder: Secondary | ICD-10-CM | POA: Diagnosis not present

## 2017-01-25 DIAGNOSIS — Z794 Long term (current) use of insulin: Secondary | ICD-10-CM | POA: Diagnosis not present

## 2017-01-25 DIAGNOSIS — M6281 Muscle weakness (generalized): Secondary | ICD-10-CM | POA: Diagnosis not present

## 2017-01-30 ENCOUNTER — Other Ambulatory Visit: Payer: Self-pay

## 2017-01-30 NOTE — Patient Outreach (Signed)
[facility]) Care Management  01/30/2017  Adrian Cross February 16, 1946 [id number]  Transition of care  Referral date: 01/30/17 Referral source: discharged from an inpatient admission from [facility] on 01/25/17 Insurance: [employer] Attempt #1  Telephone call to patient regarding transition of care follow up. Attempted call to listed number, [place] [facility].  Contact states patient is no longer resident of [place] [facility].  Attempted call to alternate listed number.  Unable to reach patient. HIPAA compliant voice message left with call back phone number.  PLAN:  RNCM will attempt 2nd telephone outreach to patient within 5 business days.   Quinn Plowman RN,BSN,CCM [facility] Telephonic  [phone]

## 2017-02-01 ENCOUNTER — Encounter (HOSPITAL_COMMUNITY): Payer: Self-pay | Admitting: Family Medicine

## 2017-02-01 ENCOUNTER — Inpatient Hospital Stay (HOSPITAL_COMMUNITY)
Admission: AD | Admit: 2017-02-01 | Discharge: 2017-02-08 | DRG: 863 | Disposition: A | Discharge status: Skilled Nursing Facility | Payer: Medicare HMO | Source: Other Acute Inpatient Hospital | Attending: Family Medicine | Admitting: Family Medicine

## 2017-02-01 ENCOUNTER — Other Ambulatory Visit: Payer: Self-pay

## 2017-02-01 DIAGNOSIS — K219 Gastro-esophageal reflux disease without esophagitis: Secondary | ICD-10-CM | POA: Diagnosis present

## 2017-02-01 DIAGNOSIS — M6281 Muscle weakness (generalized): Secondary | ICD-10-CM | POA: Diagnosis not present

## 2017-02-01 DIAGNOSIS — R7881 Bacteremia: Secondary | ICD-10-CM | POA: Diagnosis present

## 2017-02-01 DIAGNOSIS — I38 Endocarditis, valve unspecified: Secondary | ICD-10-CM | POA: Diagnosis not present

## 2017-02-01 DIAGNOSIS — Z79899 Other long term (current) drug therapy: Secondary | ICD-10-CM

## 2017-02-01 DIAGNOSIS — B9562 Methicillin resistant Staphylococcus aureus infection as the cause of diseases classified elsewhere: Secondary | ICD-10-CM | POA: Diagnosis present

## 2017-02-01 DIAGNOSIS — T847XXA Infection and inflammatory reaction due to other internal orthopedic prosthetic devices, implants and grafts, initial encounter: Secondary | ICD-10-CM | POA: Diagnosis not present

## 2017-02-01 DIAGNOSIS — L0211 Cutaneous abscess of neck: Secondary | ICD-10-CM | POA: Diagnosis not present

## 2017-02-01 DIAGNOSIS — R278 Other lack of coordination: Secondary | ICD-10-CM | POA: Diagnosis not present

## 2017-02-01 DIAGNOSIS — R2681 Unsteadiness on feet: Secondary | ICD-10-CM | POA: Diagnosis not present

## 2017-02-01 DIAGNOSIS — L02212 Cutaneous abscess of back [any part, except buttock]: Secondary | ICD-10-CM | POA: Diagnosis not present

## 2017-02-01 DIAGNOSIS — M4640 Discitis, unspecified, site unspecified: Secondary | ICD-10-CM | POA: Diagnosis present

## 2017-02-01 DIAGNOSIS — Z833 Family history of diabetes mellitus: Secondary | ICD-10-CM

## 2017-02-01 DIAGNOSIS — T8141XA Infection following a procedure, superficial incisional surgical site, initial encounter: Secondary | ICD-10-CM | POA: Diagnosis not present

## 2017-02-01 DIAGNOSIS — R609 Edema, unspecified: Secondary | ICD-10-CM | POA: Diagnosis not present

## 2017-02-01 DIAGNOSIS — G8929 Other chronic pain: Secondary | ICD-10-CM | POA: Diagnosis present

## 2017-02-01 DIAGNOSIS — I10 Essential (primary) hypertension: Secondary | ICD-10-CM | POA: Diagnosis not present

## 2017-02-01 DIAGNOSIS — A4902 Methicillin resistant Staphylococcus aureus infection, unspecified site: Secondary | ICD-10-CM | POA: Diagnosis not present

## 2017-02-01 DIAGNOSIS — R498 Other voice and resonance disorders: Secondary | ICD-10-CM | POA: Diagnosis not present

## 2017-02-01 DIAGNOSIS — M1990 Unspecified osteoarthritis, unspecified site: Secondary | ICD-10-CM | POA: Diagnosis present

## 2017-02-01 DIAGNOSIS — E1165 Type 2 diabetes mellitus with hyperglycemia: Secondary | ICD-10-CM | POA: Diagnosis present

## 2017-02-01 DIAGNOSIS — Z7401 Bed confinement status: Secondary | ICD-10-CM | POA: Diagnosis not present

## 2017-02-01 DIAGNOSIS — R41841 Cognitive communication deficit: Secondary | ICD-10-CM | POA: Diagnosis not present

## 2017-02-01 DIAGNOSIS — I809 Phlebitis and thrombophlebitis of unspecified site: Secondary | ICD-10-CM | POA: Diagnosis present

## 2017-02-01 DIAGNOSIS — Z872 Personal history of diseases of the skin and subcutaneous tissue: Secondary | ICD-10-CM | POA: Diagnosis not present

## 2017-02-01 DIAGNOSIS — Y838 Other surgical procedures as the cause of abnormal reaction of the patient, or of later complication, without mention of misadventure at the time of the procedure: Secondary | ICD-10-CM | POA: Diagnosis present

## 2017-02-01 DIAGNOSIS — M79601 Pain in right arm: Secondary | ICD-10-CM | POA: Diagnosis not present

## 2017-02-01 DIAGNOSIS — E118 Type 2 diabetes mellitus with unspecified complications: Secondary | ICD-10-CM | POA: Diagnosis not present

## 2017-02-01 DIAGNOSIS — M542 Cervicalgia: Secondary | ICD-10-CM | POA: Diagnosis not present

## 2017-02-01 DIAGNOSIS — Z794 Long term (current) use of insulin: Secondary | ICD-10-CM | POA: Diagnosis not present

## 2017-02-01 DIAGNOSIS — T8140XA Infection following a procedure, unspecified, initial encounter: Secondary | ICD-10-CM | POA: Diagnosis not present

## 2017-02-01 DIAGNOSIS — R1312 Dysphagia, oropharyngeal phase: Secondary | ICD-10-CM | POA: Diagnosis not present

## 2017-02-01 DIAGNOSIS — T847XXS Infection and inflammatory reaction due to other internal orthopedic prosthetic devices, implants and grafts, sequela: Secondary | ICD-10-CM | POA: Diagnosis not present

## 2017-02-01 DIAGNOSIS — E1141 Type 2 diabetes mellitus with diabetic mononeuropathy: Secondary | ICD-10-CM | POA: Diagnosis present

## 2017-02-01 DIAGNOSIS — F17211 Nicotine dependence, cigarettes, in remission: Secondary | ICD-10-CM | POA: Diagnosis not present

## 2017-02-01 DIAGNOSIS — M4622 Osteomyelitis of vertebra, cervical region: Secondary | ICD-10-CM | POA: Diagnosis not present

## 2017-02-01 DIAGNOSIS — Z981 Arthrodesis status: Secondary | ICD-10-CM

## 2017-02-01 DIAGNOSIS — R1084 Generalized abdominal pain: Secondary | ICD-10-CM | POA: Diagnosis not present

## 2017-02-01 DIAGNOSIS — F17210 Nicotine dependence, cigarettes, uncomplicated: Secondary | ICD-10-CM | POA: Diagnosis present

## 2017-02-01 DIAGNOSIS — M9921 Subluxation stenosis of neural canal of cervical region: Secondary | ICD-10-CM | POA: Diagnosis not present

## 2017-02-01 DIAGNOSIS — Z8711 Personal history of peptic ulcer disease: Secondary | ICD-10-CM | POA: Diagnosis not present

## 2017-02-01 DIAGNOSIS — R279 Unspecified lack of coordination: Secondary | ICD-10-CM | POA: Diagnosis not present

## 2017-02-01 DIAGNOSIS — T847XXD Infection and inflammatory reaction due to other internal orthopedic prosthetic devices, implants and grafts, subsequent encounter: Secondary | ICD-10-CM | POA: Diagnosis not present

## 2017-02-01 DIAGNOSIS — A4102 Sepsis due to Methicillin resistant Staphylococcus aureus: Secondary | ICD-10-CM | POA: Diagnosis not present

## 2017-02-01 HISTORY — DX: Dorsalgia, unspecified: M54.9

## 2017-02-01 HISTORY — DX: Other chronic pain: G89.29

## 2017-02-01 HISTORY — DX: Gastric ulcer, unspecified as acute or chronic, without hemorrhage or perforation: K25.9

## 2017-02-01 HISTORY — DX: Unspecified osteoarthritis, unspecified site: M19.90

## 2017-02-01 LAB — CBC WITH DIFFERENTIAL/PLATELET
Basophils Absolute: 0 10*3/uL (ref 0.0–0.1)
Basophils Relative: 0 %
EOS ABS: 0.2 10*3/uL (ref 0.0–0.7)
EOS PCT: 2 %
HCT: 32.1 % — ABNORMAL LOW (ref 39.0–52.0)
Hemoglobin: 10.6 g/dL — ABNORMAL LOW (ref 13.0–17.0)
LYMPHS ABS: 2.8 10*3/uL (ref 0.7–4.0)
LYMPHS PCT: 35 %
MCH: 30 pg (ref 26.0–34.0)
MCHC: 33 g/dL (ref 30.0–36.0)
MCV: 90.9 fL (ref 78.0–100.0)
MONOS PCT: 4 %
Monocytes Absolute: 0.4 10*3/uL (ref 0.1–1.0)
Neutro Abs: 4.7 10*3/uL (ref 1.7–7.7)
Neutrophils Relative %: 59 %
PLATELETS: 228 10*3/uL (ref 150–400)
RBC: 3.53 MIL/uL — AB (ref 4.22–5.81)
RDW: 13.8 % (ref 11.5–15.5)
WBC: 8 10*3/uL (ref 4.0–10.5)

## 2017-02-01 LAB — COMPREHENSIVE METABOLIC PANEL
ALBUMIN: 2.6 g/dL — AB (ref 3.5–5.0)
ALT: 24 U/L (ref 17–63)
AST: 18 U/L (ref 15–41)
Alkaline Phosphatase: 66 U/L (ref 38–126)
Anion gap: 7 (ref 5–15)
BUN: 6 mg/dL (ref 6–20)
CHLORIDE: 103 mmol/L (ref 101–111)
CO2: 25 mmol/L (ref 22–32)
CREATININE: 0.78 mg/dL (ref 0.61–1.24)
Calcium: 9.6 mg/dL (ref 8.9–10.3)
GFR calc Af Amer: >60 mL/min (ref >60)
GLUCOSE: 157 mg/dL — AB (ref 65–99)
POTASSIUM: 3.5 mmol/L (ref 3.5–5.1)
SODIUM: 135 mmol/L (ref 135–145)
Total Bilirubin: 0.5 mg/dL (ref 0.3–1.2)
Total Protein: 6.9 g/dL (ref 6.5–8.1)

## 2017-02-01 LAB — HEMOGLOBIN A1C
HEMOGLOBIN A1C: 12.3 % — AB (ref 4.8–5.6)
Mean Plasma Glucose: 306.31 mg/dL

## 2017-02-01 LAB — GLUCOSE, CAPILLARY: GLUCOSE-CAPILLARY: 253 mg/dL — AB (ref 65–99)

## 2017-02-01 MED ORDER — VANCOMYCIN HCL 10 G IV SOLR
1250.0000 mg | Freq: Two times a day (BID) | INTRAVENOUS | Status: DC
  Administered 2017-02-01: 1250 mg via INTRAVENOUS
  Filled 2017-02-01 (×3): qty 1250

## 2017-02-01 MED ORDER — HYDRALAZINE HCL 20 MG/ML IJ SOLN: 10.0000 mg | Freq: Four times a day (QID) | INTRAMUSCULAR | Status: DC | PRN

## 2017-02-01 MED ORDER — NICOTINE 14 MG/24HR TD PT24
14.0000 mg | MEDICATED_PATCH | Freq: Every day | TRANSDERMAL | Status: DC
  Administered 2017-02-03 – 2017-02-08 (×6): 14 mg via TRANSDERMAL
  Filled 2017-02-01 (×8): qty 1

## 2017-02-01 MED ORDER — SENNOSIDES-DOCUSATE SODIUM 8.6-50 MG PO TABS: 1.0000 | ORAL_TABLET | Freq: Every evening | ORAL | Status: DC | PRN

## 2017-02-01 MED ORDER — ACETAMINOPHEN 650 MG RE SUPP: 650.0000 mg | Freq: Four times a day (QID) | RECTAL | Status: DC | PRN

## 2017-02-01 MED ORDER — ENOXAPARIN SODIUM 40 MG/0.4ML ~~LOC~~ SOLN
40.0000 mg | SUBCUTANEOUS | Status: DC
  Administered 2017-02-01 – 2017-02-07 (×7): 40 mg via SUBCUTANEOUS
  Filled 2017-02-01 (×7): qty 0.4

## 2017-02-01 MED ORDER — INSULIN ASPART 100 UNIT/ML ~~LOC~~ SOLN
0.0000 [IU] | Freq: Three times a day (TID) | SUBCUTANEOUS | Status: DC
  Administered 2017-02-02 – 2017-02-03 (×2): 2 [IU] via SUBCUTANEOUS
  Administered 2017-02-03: 3 [IU] via SUBCUTANEOUS
  Administered 2017-02-03: 4 [IU] via SUBCUTANEOUS
  Administered 2017-02-04: 5 [IU] via SUBCUTANEOUS
  Administered 2017-02-04: 2 [IU] via SUBCUTANEOUS
  Administered 2017-02-04: 3 [IU] via SUBCUTANEOUS
  Administered 2017-02-05: 5 [IU] via SUBCUTANEOUS
  Administered 2017-02-05: 11 [IU] via SUBCUTANEOUS
  Administered 2017-02-06 (×3): 3 [IU] via SUBCUTANEOUS
  Administered 2017-02-07: 5 [IU] via SUBCUTANEOUS
  Administered 2017-02-07: 3 [IU] via SUBCUTANEOUS
  Administered 2017-02-07: 5 [IU] via SUBCUTANEOUS
  Administered 2017-02-08: 2 [IU] via SUBCUTANEOUS
  Administered 2017-02-08: 3 [IU] via SUBCUTANEOUS

## 2017-02-01 MED ORDER — LISINOPRIL 40 MG PO TABS
40.0000 mg | ORAL_TABLET | Freq: Every day | ORAL | Status: DC
  Administered 2017-02-02 – 2017-02-08 (×7): 40 mg via ORAL
  Filled 2017-02-01 (×7): qty 1

## 2017-02-01 MED ORDER — ACETAMINOPHEN 325 MG PO TABS
650.0000 mg | ORAL_TABLET | Freq: Four times a day (QID) | ORAL | Status: DC | PRN
Start: 2017-02-01 — End: 2017-02-08
  Administered 2017-02-01 – 2017-02-08 (×5): 650 mg via ORAL
  Filled 2017-02-01 (×5): qty 2

## 2017-02-01 MED ORDER — INSULIN ASPART PROT & ASPART (70-30 MIX) 100 UNIT/ML ~~LOC~~ SUSP
15.0000 [IU] | Freq: Two times a day (BID) | SUBCUTANEOUS | Status: DC
  Administered 2017-02-02 – 2017-02-04 (×6): 15 [IU] via SUBCUTANEOUS
  Filled 2017-02-01: qty 10

## 2017-02-01 MED ORDER — INSULIN LISPRO PROT & LISPRO (75-25 MIX) 100 UNIT/ML KWIKPEN: 15.0000 [IU] | PEN_INJECTOR | Freq: Two times a day (BID) | SUBCUTANEOUS | Status: DC

## 2017-02-01 MED ORDER — GABAPENTIN 300 MG PO CAPS
300.0000 mg | ORAL_CAPSULE | Freq: Two times a day (BID) | ORAL | Status: DC
  Administered 2017-02-01 – 2017-02-08 (×14): 300 mg via ORAL
  Filled 2017-02-01 (×14): qty 1

## 2017-02-01 MED ORDER — PANTOPRAZOLE SODIUM 40 MG PO TBEC
40.0000 mg | DELAYED_RELEASE_TABLET | Freq: Two times a day (BID) | ORAL | Status: DC
  Administered 2017-02-01 – 2017-02-08 (×14): 40 mg via ORAL
  Filled 2017-02-01 (×14): qty 1

## 2017-02-01 MED ORDER — POLYETHYLENE GLYCOL 3350 17 G PO PACK
17.0000 g | PACK | Freq: Every day | ORAL | Status: DC | PRN
  Administered 2017-02-02 – 2017-02-04 (×2): 17 g via ORAL
  Filled 2017-02-01 (×2): qty 1

## 2017-02-01 NOTE — Progress Notes (Signed)
DRAKEN FARRIOR [id number]  Code Status: FULL  Admission Data: 02/01/2017 7:36 PM  Attending Provider: Quincy Simmonds  RJJ:OACZY, Anthony Sar, MD  Consults/ Treatment Team:   Adrian Cross is a 71 y.o. male patient admitted directly from [facility], alert - oriented X 3 - no acute distress noted. VSS - Blood pressure (!) 156/60, pulse 64, temperature 98.5 F (36.9 C), temperature source Oral, resp. rate 18, height 5\' 4"  (1.626 m), weight 65.9 kg (145 lb 4.8 oz), SpO2 97 %. no c/o shortness of breath, no c/o chest pain.   Allergies: No Known Allergies    Past Medical History:  Diagnosis Date  . GERD (gastroesophageal reflux disease)   . Hypertension   . Omental mass 2015   abd abscess  . Pinched nerve    "back" (10/30/2013)  . Type II diabetes mellitus (HCC)     Medications Prior to Admission  Medication Sig Dispense Refill  . gabapentin (NEURONTIN) 300 MG capsule Take 300 mg by mouth 2 (two) times daily.    Marland Kitchen glyBURIDE (DIABETA) 5 MG tablet Take 1 tablet (5 mg total) by mouth 2 (two) times daily with a meal. 20 tablet 0  . Insulin Lispro Prot & Lispro (HUMALOG MIX 75/25 KWIKPEN) (75-25) 100 UNIT/ML Kwikpen Inject 3 Units into the skin 2 (two) times daily with a meal. With breakfast and supper 15 mL 1  . lisinopril (PRINIVIL,ZESTRIL) 40 MG tablet Take 40 mg by mouth daily.    . methocarbamol (ROBAXIN) 500 MG tablet Take 1 tablet (500 mg total) by mouth every 6 (six) hours as needed for muscle spasms. 30 tablet 1  . pantoprazole (PROTONIX) 40 MG tablet Take 1 tablet (40 mg total) by mouth 2 (two) times daily. 60 tablet 1    Orientation to room, and floor completed with information packet given to patient/family. Patient declined safety video at this time. Admission INP armband ID verified with patient/family, and in place.  SR up x 2, fall assessment complete, with patient and family able to verbalize understanding of risk associated with falls, and verbalized understanding to call nsg  before up out of bed. Call light within reach, patient able to voice, and demonstrate understanding. Skin, clean-dry, and flaky without evidence of bruising, or skin tears. Old scars noted on fingers of right hand. No evidence of skin break down noted on exam.  ?  Will cont to eval and treat per MD orders.  Melonie [place], RN  02/01/2017 7:36 PM

## 2017-02-01 NOTE — Progress Notes (Signed)
Pharmacy Antibiotic Note  Adrian Cross is a 71 y.o. male admitted on 02/01/2017 from [facility] with persistent MRSA bacteremia (source cervical incision wound) on Vancomycin.  Pharmacy has been consulted for continuing Vancomycin dosing. ID was consulted while at [facility] and decision was made to have TEE requiring patient transfer to [facility]. Continuing on Vancomycin for now. Per [facility] note for I&D, hardware was not noted to be infected. PICC line placed on 01/25/17 has now been removed.   Two vancomycin troughs were drawn while at [facility]. The first was drawn early and thought to be sub-therapeutic requiring a dose increase on 01/28/17. The second level on 01/30/17 was therapeutic at 15.5 on a dose of 1250 mg IV every 12 hours. Per discussion with Illene Regulus, pharmacist at [facility] - patient received last dose on 02/01/17 at 0400 AM. We will restart this PM - dose being delayed ~3 hrs due to hospital transfer.   SCr this AM at [facility] was 0.71 which appears to be stable for patient based on past records. Estimated CrCl ~91 mL/min.  Plan: Restart Vancomycin 1250 mg IV every 12 hours - give dose now, then will adjust to 0500/1700 schedule.  Monitor renal function, culture results, and clinical status.  Follow-up TEE.  If hardware still a concern, consider addition of Rifampin. Will plan to draw Vancomycin trough prior to 4th dose.    Height: 5\' 4"  (162.6 cm) Weight: 145 lb 4.8 oz (65.9 kg) IBW/kg (Calculated) : 59.2  Temp (24hrs), Avg:98.5 F (36.9 C), Min:98.5 F (36.9 C), Max:98.5 F (36.9 C)  No results for input(s): WBC, CREATININE, LATICACIDVEN, VANCOTROUGH, VANCOPEAK, VANCORANDOM, GENTTROUGH, GENTPEAK, GENTRANDOM, TOBRATROUGH, TOBRAPEAK, TOBRARND, AMIKACINPEAK, AMIKACINTROU, AMIKACIN in the last 168 hours.  CrCl cannot be calculated (Patient's most recent lab result is older than the maximum 21 days allowed.).    No Known  Allergies  Antimicrobials this admission: Vancomycin 11/7 >>  Dose adjustments this admission: 11/11 VT at 03:38 = 14 (drawn early- suspected lower per discussion w/ Illene Regulus at [facility], on 1g q12, SCr  >> increased to 1250 q12) 11/13 VT at 03:45 =  15.5 (continued 1250 q12)  Microbiology results: Blood ([facility]) 11/8 - 1/2 MRSA Cervical Wound 11/9 [facility]) - MRSA Blood 11/11 [facility])- 1/2 MRSA  Thank you for allowing pharmacy to be a part of this patient's care.  Sloan Leiter, PharmD, BCPS, BCCCP Clinical Pharmacist Clinical phone 02/01/2017 until 11PM [phone] After hours, please call #[phone] 02/01/2017 6:15 PM

## 2017-02-01 NOTE — H&P (Signed)
History and Physical    Adrian Cross ZOX:[id number] DOB: 05/03/1945  DOA: 02/01/2017 PCP: Hilbert Corrigan, MD  Patient coming from: [facility]   Chief Complaint: Bacteremia   HPI: Adrian Cross is a 71 y.o. male with medical history significant of hypertension and diabetes type 2 who is being transferred from [facility] due to persistent  MRSA bacteremia.  Patient initially presented to [facility] hospital on 11/7 with AMS.  Upon evaluation he was found to have gram-positive bacteremia and a CT scan suggesting infection of the neck at the site of the incision of prior surgery.  Patient had a cervical surgery spine fusion from C3-C6.  Patient subsequently was transferred to [facility] as prior surgery was perfomed at that hospital.  Subsequently patient underwent I&D of the cervical spine with fluid collection growing MRSA, PICC line was placed in patient was started on vancomycin.  Despite treatment blood cultures continue to be positive for MRSA.  ID was consulted and recommended removal of the PICC line and get a TEE.  Due to inability of perform TEE at [facility] patient was transferred to [facility].  Patient currently asymptomatic, denies neck pain, fevers, chest pain, shortness of breath and dizziness. Patient denies IV drug use or recent dental procedure.    Review of Systems:   General: no changes in body weight, no fever chills or decrease in energy.  HEENT: no blurry vision, hearing changes or sore throat Respiratory: no dyspnea, coughing, wheezing CV: no chest pain, no palpitations GI: no nausea, vomiting, abdominal pain, diarrhea, constipation GU: no dysuria, burning on urination, increased urinary frequency, hematuria  Ext:. No deformities,  Neuro: no unilateral weakness, numbness, or tingling, no vision change or hearing loss Skin: No rashes, lesions or wounds. MSK: No muscle spasm, no deformity. + neck limited ROM  Heme: No  easy bruising.  Travel history: No recent long distant travel.   Past Medical History:  Diagnosis Date  . GERD (gastroesophageal reflux disease)   . Hypertension   . Omental mass 2015   abd abscess  . Pinched nerve    "back" (10/30/2013)  . Type II diabetes mellitus ([place])     Past Surgical History:  Procedure Laterality Date  . CARPAL TUNNEL RELEASE Left 03/2013  . ESOPHAGOGASTRODUODENOSCOPY N/A 11/30/2016   Procedure: ESOPHAGOGASTRODUODENOSCOPY (EGD);  Surgeon: Rogene Houston, MD;  [facility];  Service: Endoscopy;  Laterality: N/A;  . KNEE ARTHROSCOPY Left 1980's  . LAPAROTOMY N/A 11/03/2013   Procedure: EXPLORATORY LAPAROTOMY for resection of omental mass;  Surgeon: Imogene Burn. Georgette Dover, MD;  Location: [place];  Service: General;  Laterality: N/A;     reports that he has been smoking cigarettes.  He has a 56.00 pack-year smoking history. he has never used smokeless tobacco. He reports that he does not drink alcohol or use drugs.  No Known Allergies  Family History  Problem Relation Age of Onset  . Diabetes Mellitus II Sister   . Stroke Neg Hx     Prior to Admission medications   Medication Sig Start Date End Date Taking? Authorizing Provider  gabapentin (NEURONTIN) 300 MG capsule Take 300 mg by mouth 2 (two) times daily.    [provider]  glyBURIDE (DIABETA) 5 MG tablet Take 1 tablet (5 mg total) by mouth 2 (two) times daily with a meal. 12/01/16 12/11/16  Reyne Dumas, MD  Insulin Lispro Prot & Lispro (HUMALOG MIX 75/25 KWIKPEN) (75-25) 100 UNIT/ML Kwikpen Inject  3 Units into the skin 2 (two) times daily with a meal. With breakfast and supper 12/01/16   Reyne Dumas, MD  lisinopril (PRINIVIL,ZESTRIL) 40 MG tablet Take 40 mg by mouth daily. 09/27/13   [provider]  methocarbamol (ROBAXIN) 500 MG tablet Take 1 tablet (500 mg total) by mouth every 6 (six) hours as needed for muscle spasms. 12/01/16   Reyne Dumas, MD  pantoprazole (PROTONIX) 40 MG  tablet Take 1 tablet (40 mg total) by mouth 2 (two) times daily. 12/01/16 12/31/16  Reyne Dumas, MD    Physical Exam: Vitals:   02/01/17 1723  BP: (!) 156/60  Pulse: 64  Resp: 18  Temp: 98.5 F (36.9 C)  TempSrc: Oral  SpO2: 97%  Weight: 65.9 kg (145 lb 4.8 oz)  Height: 5\' 4"  (1.626 m)   Constitutional: NAD, calm, comfortable Eyes: PERRL, lids and conjunctivae normal ENMT: Mucous membranes are moist. Posterior pharynx clear of any exudate or lesions.  Neck: Limited ROM, midline sutures clean and intact no swelling or drainage noted  Respiratory: clear to auscultation bilaterally, no wheezing, no crackles. Normal respiratory effort. Cardiovascular: Regular rate and rhythm, no murmurs / rubs / gallops. No extremity edema. 2+ pedal pulses.  Abdomen: no tenderness, no masses palpated. No hepatosplenomegaly. Bowel sounds positive.  Musculoskeletal: No joint deformity upper and lower extremities. Good ROM, no contractures.  Skin: no rashes, 2 dry scab lesions in the 3 and 4 finger on the R hand  Neurologic: CN 2-12 grossly intact. Sensation intact Psychiatric: Normal judgment and insight. Alert and oriented x 3. Normal mood.   Labs on Admission: I have personally reviewed following labs and imaging studies  CBC: No results for input(s): WBC, NEUTROABS, HGB, HCT, MCV, PLT in the last 168 hours. Basic Metabolic Panel: No results for input(s): NA, K, CL, CO2, GLUCOSE, BUN, CREATININE, CALCIUM, MG, PHOS in the last 168 hours. GFR: CrCl cannot be calculated (Patient's most recent lab result is older than the maximum 21 days allowed.). Liver Function Tests: No results for input(s): AST, ALT, ALKPHOS, BILITOT, PROT, ALBUMIN in the last 168 hours. No results for input(s): LIPASE, AMYLASE in the last 168 hours. No results for input(s): AMMONIA in the last 168 hours. Coagulation Profile: No results for input(s): INR, PROTIME in the last 168 hours. Cardiac Enzymes: No results for input(s):  CKTOTAL, CKMB, CKMBINDEX, TROPONINI in the last 168 hours. BNP (last 3 results) No results for input(s): PROBNP in the last 8760 hours. HbA1C: No results for input(s): HGBA1C in the last 72 hours. CBG: No results for input(s): GLUCAP in the last 168 hours. Lipid Profile: No results for input(s): CHOL, HDL, LDLCALC, TRIG, CHOLHDL, LDLDIRECT in the last 72 hours. Thyroid Function Tests: No results for input(s): TSH, T4TOTAL, FREET4, T3FREE, THYROIDAB in the last 72 hours. Anemia Panel: No results for input(s): VITAMINB12, FOLATE, FERRITIN, TIBC, IRON, RETICCTPCT in the last 72 hours. Urine analysis:    Component Value Date/Time   COLORURINE STRAW (A) 11/29/2016 0933   APPEARANCEUR CLEAR 11/29/2016 0933   LABSPEC 1.023 11/29/2016 0933   PHURINE 8.0 11/29/2016 0933   GLUCOSEU >=500 (A) 11/29/2016 0933   HGBUR NEGATIVE 11/29/2016 0933   BILIRUBINUR NEGATIVE 11/29/2016 0933   KETONESUR 20 (A) 11/29/2016 0933   PROTEINUR NEGATIVE 11/29/2016 0933   NITRITE NEGATIVE 11/29/2016 0933   LEUKOCYTESUR NEGATIVE 11/29/2016 0933   Sepsis Labs: !!!!!!!!!!!!!!!!!!!!!!!!!!!!!!!!!!!!!!!!!!!! @LABRCNTIP (procalcitonin:4,lacticidven:4) )No results found for this or any previous visit (from the past 240 hour(s)).   Radiological Exams on  Admission: No results found.  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 Patient underwent I&D 11/9 (back abscess) at outside hospital with cultures showing MRSA from cervical wound. Per outside report hardware was not infected.  Admit to medsurg Repeat Blood cultures no BCID from outside hospital, previous blood cultures 11/8, 11/11 + MRSA 1/2  Patient had PICC line which was removed PTA  Continue Vancomycin for now  Will repeat CT neck to assure resolution of  abscess  TEE, placed patient NPO, hopefully there is space in the schedule to do this in AM.  ECHO done at outside facility - pending results  ID consulted, recommending to keep on Vanc for now   HTN  BP seems to be stable  Continue Lisinopril  Hydralazine PRN   DM type 2 with neuropathy  Continue Insuline 75/25 15 units  Continue gabapentin  SSI and monitor CBG's   Tobacco abuse  Nicotine patch  Tobacco cessation discussed   DVT prophylaxis: Lovenox  Code Status: Full code  Family Communication: None at bedside  Disposition Plan: Anticipate discharge to previous home environment.  Consults called: ID  Admission status: Medsurg/Inpatient   Chipper Oman MD [facility] Pager: Text Page via [email or web address]  [phone]  If 7PM-7AM, please contact night-coverage [email or web address] Password [email or web address]  02/01/2017, 6:01 PM

## 2017-02-01 NOTE — Patient Outreach (Signed)
[facility]) Care Management  02/01/2017  Adrian Cross 17-May-1945 [id number]   Transition of care  Referral date: 01/30/17 Referral source: discharged from an inpatient admission from [facility] on 01/25/17 Insurance: [employer] Attempt #2  Telephone call to patient regarding transition of care follow up. Contact answering phone stated she was patients daughter. RNCM informed patient I would not be able to speak with her without patients verbal consent. Contact states patient is in the hospital and will probably be in for a while. Contact unable to take message due to driving at time of call.   PLAN:  RNCM will attempt 3rd  telephone outreach to patient within 5 business days.   Quinn Plowman RN,BSN,CCM [facility] Telephonic  [phone]

## 2017-02-02 ENCOUNTER — Other Ambulatory Visit: Payer: Self-pay

## 2017-02-02 ENCOUNTER — Inpatient Hospital Stay (HOSPITAL_COMMUNITY): Payer: Medicare HMO

## 2017-02-02 ENCOUNTER — Encounter (HOSPITAL_COMMUNITY): Payer: Self-pay | Admitting: General Practice

## 2017-02-02 DIAGNOSIS — Z872 Personal history of diseases of the skin and subcutaneous tissue: Secondary | ICD-10-CM

## 2017-02-02 DIAGNOSIS — Z981 Arthrodesis status: Secondary | ICD-10-CM

## 2017-02-02 DIAGNOSIS — Z833 Family history of diabetes mellitus: Secondary | ICD-10-CM

## 2017-02-02 DIAGNOSIS — F17211 Nicotine dependence, cigarettes, in remission: Secondary | ICD-10-CM

## 2017-02-02 LAB — GLUCOSE, CAPILLARY
GLUCOSE-CAPILLARY: 148 mg/dL — AB (ref 65–99)
GLUCOSE-CAPILLARY: 110 mg/dL — AB (ref 65–99)
GLUCOSE-CAPILLARY: 128 mg/dL — AB (ref 65–99)
Glucose-Capillary: 137 mg/dL — ABNORMAL HIGH (ref 65–99)

## 2017-02-02 LAB — CBC
HCT: 30.8 % — ABNORMAL LOW (ref 39.0–52.0)
Hemoglobin: 10 g/dL — ABNORMAL LOW (ref 13.0–17.0)
MCH: 29.7 pg (ref 26.0–34.0)
MCHC: 32.5 g/dL (ref 30.0–36.0)
MCV: 91.4 fL (ref 78.0–100.0)
PLATELETS: 226 10*3/uL (ref 150–400)
RBC: 3.37 MIL/uL — ABNORMAL LOW (ref 4.22–5.81)
RDW: 13.9 % (ref 11.5–15.5)
WBC: 7 10*3/uL (ref 4.0–10.5)

## 2017-02-02 LAB — BASIC METABOLIC PANEL
Anion gap: 5 (ref 5–15)
BUN: 9 mg/dL (ref 6–20)
CALCIUM: 9.4 mg/dL (ref 8.9–10.3)
CO2: 26 mmol/L (ref 22–32)
CREATININE: 0.87 mg/dL (ref 0.61–1.24)
Chloride: 104 mmol/L (ref 101–111)
GFR calc Af Amer: >60 mL/min (ref >60)
Glucose, Bld: 236 mg/dL — ABNORMAL HIGH (ref 65–99)
Potassium: 3.9 mmol/L (ref 3.5–5.1)
Sodium: 135 mmol/L (ref 135–145)

## 2017-02-02 MED ORDER — VANCOMYCIN HCL 10 G IV SOLR
1250.0000 mg | Freq: Two times a day (BID) | INTRAVENOUS | Status: DC
  Administered 2017-02-02 – 2017-02-03 (×4): 1250 mg via INTRAVENOUS
  Filled 2017-02-02 (×5): qty 1250

## 2017-02-02 MED ORDER — TRAMADOL HCL 50 MG PO TABS
50.0000 mg | ORAL_TABLET | Freq: Four times a day (QID) | ORAL | Status: DC | PRN
Start: 2017-02-02 — End: 2017-02-08
  Administered 2017-02-02 – 2017-02-05 (×3): 50 mg via ORAL
  Filled 2017-02-02 (×3): qty 1

## 2017-02-02 MED ORDER — GLUCERNA SHAKE PO LIQD
237.0000 mL | Freq: Two times a day (BID) | ORAL | Status: DC
  Administered 2017-02-02 – 2017-02-08 (×13): 237 mL via ORAL

## 2017-02-02 MED ORDER — IOPAMIDOL (ISOVUE-300) INJECTION 61%
INTRAVENOUS | Status: AC
  Administered 2017-02-02: 01:00:00
  Administered 2017-02-02: 75 mL
  Filled 2017-02-02: qty 75

## 2017-02-02 NOTE — Consult Note (Signed)
[place] for Infectious Disease   Reason for Consult: Persistent MRSA Bacteremia    Referring Physician: Patrecia Pour  Active Problems:   Bacteremia  . enoxaparin (LOVENOX) injection  40 mg Subcutaneous Q24H  . gabapentin  300 mg Oral BID  . insulin aspart  0-15 Units Subcutaneous TID WC  . insulin aspart protamine- aspart  15 Units Subcutaneous BID WC  . lisinopril  40 mg Oral Daily  . nicotine  14 mg Transdermal Daily  . pantoprazole  40 mg Oral BID   Recommendations: Will need long term IV Vancomycin TEE needed, scheduled for Monday.  Needs neurosurg eval to evaluate for wash-out or revision Follow admission bcx  Assessment: 71 y/o M here with persistent MRSA bacteremia despite IV Vancomycin. Recent cervical procedure C3-C6 with abscess growing MRSA. Abscess persisted despite I&D & IV antibiotics.   Antibiotics: Vancomycin 11/7 >>  HPI: Adrian Cross is a 71 y.o. male with functional limitations at baseline with Mhx of HTN, DM2 and recent cervical laminectomy with fusion (01/11/17) transferred to [facility] from [facility] 02/01/17 with persistent MRSA Bacteremia. Initially admitted 01/23/17 at [place] with AMS x1 week and was found to have MRSA bacteremia on 2/2 admission blood cultures. He was started on IV Vancomycin. CT c-spine showed a fluid collection near site of recent cervical fusion of C3-C6 and was subsequently transferred to [facility] as prior surgery was performed at this location. There, I&D of fluid collection grew 2/2 MRSA. IV Vancomycin was continued however despite this the patient remained febrile and repeat Bcx returned positive for MSA. He was subsequently transferred to [facility] for TEE.   On arrival at [facility] he was afebrile and without leukocytosis. Repeat CT showed persistent 4.8 x 1.1 x 6.8 cm gas and fluid filled paraspinal collection along the posterior surgical approach.   Review of Systems: All other systems reviewed and are negative    Past  Medical History:  Diagnosis Date  . Arthritis    "all over" (02/01/2017)  . Chronic back pain   . GERD (gastroesophageal reflux disease)   . Hypertension   . Omental mass 2015   abd abscess  . Pinched nerve    "back" (10/30/2013)  . Stomach ulcer   . Type II diabetes mellitus (HCC)     Social History   Tobacco Use  . Smoking status: Former Smoker    Packs/day: 1.00    Years: 54.00    Pack years: 54.00    Types: Cigarettes    Last attempt to quit: 12/18/2016    Years since quitting: 0.1  . Smokeless tobacco: Never Used  Substance Use Topics  . Alcohol use: No  . Drug use: No    Family History  Problem Relation Age of Onset  . Diabetes Mellitus II Sister   . Stroke Neg Hx    No Known Allergies  Physical Exam Constitutional: Elderly AA male eating breakfast in bed. NAD. Pleasant. Vitals:   02/01/17 2136 02/02/17 0539  BP: 127/65 (!) 146/69  Pulse: 83 60  Resp: 18 17  Temp: 98.5 F (36.9 C) 97.8 F (36.6 C)  SpO2: 97% 99%   EYES: anicteric, no injection. ENMT: No thrush. C-spine tenderness posteriorly. Cervical ROm limited.  Cardiovascular: RRR. No murmur appreciated.  Respiratory: CTA Bl. Normal WOB RA. GI: soft, NT, ND. +Bs Musculoskeletal: Skin intact, No suspicious lesions.  Lab Results  Component Value Date   WBC 7.0 02/02/2017   HGB 10.0 (L) 02/02/2017  HCT 30.8 (L) 02/02/2017   MCV 91.4 02/02/2017   PLT 226 02/02/2017    Lab Results  Component Value Date   CREATININE 0.87 02/02/2017   BUN 9 02/02/2017   NA 135 02/02/2017   K 3.9 02/02/2017   CL 104 02/02/2017   CO2 26 02/02/2017    Lab Results  Component Value Date   ALT 24 02/01/2017   AST 18 02/01/2017   ALKPHOS 66 02/01/2017     Microbiology: Bcx 01/29/17: MRSA 01/31/17: SA 02/01/17: SA  Chanese Hartsough, DO [place] for Infectious Disease [facility] [email or web address] [phone] pager  [phone] cell 02/02/2017, 9:53 AM

## 2017-02-02 NOTE — Progress Notes (Signed)
    [facility] has been requested to perform a transesophageal echocardiogram on Kathline Magic for bacteremia.  After careful review of history and examination, the risks and benefits of transesophageal echocardiogram have been explained including risks of esophageal damage, perforation (1:10,000 risk), bleeding, pharyngeal hematoma as well as other potential complications associated with conscious sedation including aspiration, arrhythmia, respiratory failure and death. Alternatives to treatment were discussed, questions were answered. Patient is willing to proceed.   BP has been stable with no requirement for pressor support. Hgb stable at 10.0 with platelets at 226.  TEE is scheduled for 02/05/2017 at 1300. He should be NPO after midnight except sips with meds leading up to the procedure.   Erma Heritage, PA-C  02/02/2017 3:04 PM

## 2017-02-02 NOTE — Care Management Note (Addendum)
Case Management Note  Patient Details  Name: Adrian Cross MRN: [id number] Date of Birth: 08-01-1945  Subjective/Objective:      11/19 TTE scheduled today.Marland KitchenMarland KitchenID following  Pt transferred transferred from [facility] due to persistent  MRSA bacteremia. Resides with daughter and son in law.  AST:[id number] and walker.  [name] (Daughter) Doris Scales (Sister)  Francee Piccolo (son in [phone]  [phone]    PCP: Iona Beard  Action/Plan: SNF vs HH....planning for transition home .... CM following for disposition needs.  Expected Discharge Date:                  Expected Discharge Plan:     In-House Referral:     Discharge planning Services  CM Consult  Post Acute Care Choice:    Choice offered to:     DME Arranged:    DME Agency:     HH Arranged:    HH Agency:     Status of Service:  In process, will continue to follow  If discussed at Long Length of Stay Meetings, dates discussed:    Additional Comments:  Sharin Mons, RN 02/02/2017, 9:05 AM

## 2017-02-02 NOTE — Progress Notes (Signed)
[facility] New pt this admission for [facility].  [facility] will provide [facility] services- RN and [facility] services for home IVABX as ordered/needed for home.  [facility] Infusion Coordinator will follow pt and provide in hospital teaching to support independence with IV ABX administration at home.   If patient discharges after hours, please call [phone].   Larry Sierras 02/02/2017, 10:26 AM

## 2017-02-02 NOTE — Progress Notes (Signed)
Initial Nutrition Assessment  DOCUMENTATION CODES:   Not applicable  INTERVENTION:   Glucerna Shake po BID, each supplement provides 220 kcal and 10 grams of protein  NUTRITION DIAGNOSIS:   Increased nutrient needs related to acute illness as evidenced by estimated needs  GOAL:   Patient will meet greater than or equal to 90% of their needs  MONITOR:   PO intake, Supplement acceptance, Labs, Weight trends, I & O's  REASON FOR ASSESSMENT:   Malnutrition Screening Tool    ASSESSMENT:   Pt with PMH of HTN and type II DM presents with bactermia d/t persistent MRSA.   Pt scheduled for TEE 11/19 Pt consumed all of his breakfast at time of visit. Pt reports a good appetite currently and good PO intake PTA. Pt reports consuming 3 meals and 1 snack per day, with lunch and dinner being a little smaller in size.   Pt reports a UBW of 160 lbs but is unsure of time frame or if weight loss has occurred.   Pt reports using a wheelchair and walker at baseline but has been pretty sedentary. Per chart, pt is s/p spinal fusion and I&D of cervical spine. Pt reports wanting to go to rehab and increase activity level/independence.   Labs reviewed; CBG 137-253, A1C 12.3, Albumin 2.6 Medications reviewed; sliding scale insulin, 15 units Insulin aspart protamine 2 times daily with meals, Protonix  Pt at risk for malnutrition if weight status does not stabilize, pt amenable to nutritional supplementation while admitted.   NUTRITION - FOCUSED PHYSICAL EXAM:    Most Recent Value  Orbital Region  No depletion  Upper Arm Region  Mild depletion  Thoracic and Lumbar Region  No depletion  Buccal Region  No depletion  Temple Region  Moderate depletion  Clavicle Bone Region  Moderate depletion  Clavicle and Acromion Bone Region  Moderate depletion  Scapular Bone Region  Unable to assess  Dorsal Hand  Moderate depletion  Patellar Region  Moderate depletion  Anterior Thigh Region  Moderate  depletion  Posterior Calf Region  Moderate depletion  Edema (RD Assessment)  None      Diet Order:  Diet heart healthy/carb modified Room service appropriate? Yes with Assist; Fluid consistency: Thin  EDUCATION NEEDS:   Education needs have been addressed  Skin:  Skin Assessment: Reviewed RN Assessment  Last BM:  no recorded BM  Height:   Ht Readings from Last 1 Encounters:  02/01/17 5\' 4"  (1.626 m)    Weight:   Wt Readings from Last 1 Encounters:  02/01/17 145 lb 4.8 oz (65.9 kg)    Ideal Body Weight:  59 kg  BMI:  Body mass index is 24.94 kg/m.  Estimated Nutritional Needs:   Kcal:  1700-1900  Protein:  85-95 grams  Fluid:  >/= 1.7 L/d  Parks Ranger, MS, RDN, LDN 02/02/2017 2:12 PM

## 2017-02-02 NOTE — Progress Notes (Addendum)
PROGRESS NOTE [facility]   Adrian Cross   ZHG:[id number] DOB: 1946-02-21  DOA: 02/01/2017 PCP: Iona Beard, MD   Brief Narrative:  Adrian Cross 71 year old male with significant medical history of hypertension, diabetes type 2 and spinal fusion of C3-6.  Who was transferred from [facility] due to persistent MRSA bacteremia and to perform TEE.  Patient was found to have a cervical/neck abscess which was I&D 11/9 by neurosurgery, per outside record hardware was not infected.  Despite patient being on vancomycin continues to have blood cultures positive to MRSA.  Repeat CT showed persistent 4.8 x 1.1 x 6.8 cm gas and fluid filled paraspinal collection along the posterior surgical approach. ID was consulted and blood cultures were repeated   Subjective: Patient seen and examined, he has no complaints.   Assessment & Plan: Persistent MRSA Bacteremia due to neck/back abscess Patient underwent spinal fusion of C3-C6 about a month ago, now developed infection at the surgical site. Patient underwent I&D 11/9 (back abscess) at outside hospital with cultures showing MRSA from cervical wound. Per outside report hardware was not infected.  Blood cultures no BCID from outside hospital, previous blood cultures 11/8, 11/11 + MRSA 1/2  Patient had PICC line which was removed PTA  Repeat CT showed persistent 4.8 x 1.1 x 6.8 cm gas and fluid filled paraspinal collection along the posterior surgical approach - Case discussed with IR - recommended Neurosurgical evaluation. Neurosurgery consulted.  TEE on Monday 11/19 ECHO done at outside facility - pending results  ID recommendations appreciated - will continue vancomycin   HTN  BP stable  Continue Lisinopril  Hydralazine PRN   DM type 2 with neuropathy - CBG's stable, poorly controlled  A1C, 12.3 Continue Insulin 75/25 15 units adjust pending Novolog requirement  Continue gabapentin  Continue SSI    Tobacco abuse  Nicotine patch     DVT prophylaxis: Lovenox  Code Status: Full Code Family Communication: None at bedside  Disposition Plan: Need PT may need SNF    Consultants:   ID   Neurosurgery   Procedures:   None   Antimicrobials:  Vancomycin    Objective: Vitals:   02/01/17 1723 02/01/17 2136 02/02/17 0539 02/02/17 1523  BP: (!) 156/60 127/65 (!) 146/69 (!) 158/63  Pulse: 64 83 60 66  Resp: 18 18 17 17   Temp: 98.5 F (36.9 C) 98.5 F (36.9 C) 97.8 F (36.6 C) 98.2 F (36.8 C)  TempSrc: Oral Oral Oral Oral  SpO2: 97% 97% 99%   Weight: 65.9 kg (145 lb 4.8 oz)     Height: 5\' 4"  (1.626 m)       Intake/Output Summary (Last 24 hours) at 02/02/2017 1535 Last data filed at 02/02/2017 1524 Gross per 24 hour  Intake 720 ml  Output -  Net 720 ml   Filed Weights   02/01/17 1723  Weight: 65.9 kg (145 lb 4.8 oz)    Examination:  General exam: Appears calm and comfortable  HEENT: AC/AT, PERRLA, OP moist and clear, Neck limited ROM, midline sutures clean and intact no drain Respiratory system: Clear to auscultation. No wheezes,crackle or rhonchi Cardiovascular system: S1 & S2 heard, RRR. No JVD, murmurs, rubs or gallops Gastrointestinal system: Abdomen is nondistended, soft and nontender. Normal bowel sounds heard. Central nervous system: Alert and oriented. No focal neurological deficits. Extremities: No pedal edema.  Skin: No rashes Psychiatry: Mood & affect appropriate.    Data Reviewed: I have personally reviewed following labs and imaging studies  CBC: Recent Labs  Lab 02/01/17 1926 02/02/17 0410  WBC 8.0 7.0  NEUTROABS 4.7  --   HGB 10.6* 10.0*  HCT 32.1* 30.8*  MCV 90.9 91.4  PLT 228 601   Basic Metabolic Panel: Recent Labs  Lab 02/01/17 1926 02/02/17 0410  NA 135 135  K 3.5 3.9  CL 103 104  CO2 25 26  GLUCOSE 157* 236*  BUN 6 9  CREATININE 0.78 0.87  CALCIUM 9.6 9.4   GFR: Estimated Creatinine Clearance: 67.1 mL/min (by C-G formula based on SCr of 0.87  mg/dL). Liver Function Tests: Recent Labs  Lab 02/01/17 1926  AST 18  ALT 24  ALKPHOS 66  BILITOT 0.5  PROT 6.9  ALBUMIN 2.6*   No results for input(s): LIPASE, AMYLASE in the last 168 hours. No results for input(s): AMMONIA in the last 168 hours. Coagulation Profile: No results for input(s): INR, PROTIME in the last 168 hours. Cardiac Enzymes: No results for input(s): CKTOTAL, CKMB, CKMBINDEX, TROPONINI in the last 168 hours. BNP (last 3 results) No results for input(s): PROBNP in the last 8760 hours. HbA1C: Recent Labs    02/01/17 1926  HGBA1C 12.3*   CBG: Recent Labs  Lab 02/01/17 2334 02/02/17 1009 02/02/17 1150  GLUCAP 253* 148* 137*   Lipid Profile: No results for input(s): CHOL, HDL, LDLCALC, TRIG, CHOLHDL, LDLDIRECT in the last 72 hours. Thyroid Function Tests: No results for input(s): TSH, T4TOTAL, FREET4, T3FREE, THYROIDAB in the last 72 hours. Anemia Panel: No results for input(s): VITAMINB12, FOLATE, FERRITIN, TIBC, IRON, RETICCTPCT in the last 72 hours. Sepsis Labs: No results for input(s): PROCALCITON, LATICACIDVEN in the last 168 hours.  No results found for this or any previous visit (from the past 240 hour(s)).    Radiology Studies: Ct Soft Tissue Neck W Contrast  Result Date: 02/02/2017 CLINICAL DATA:  Initial evaluation for bacteremia, back pain, infection of the neck at site of incision. EXAM: CT NECK WITH CONTRAST TECHNIQUE: Multidetector CT imaging of the neck was performed using the standard protocol following the bolus administration of intravenous contrast. CONTRAST:  <See Chart> ISOVUE-300 IOPAMIDOL (ISOVUE-300) INJECTION 61% COMPARISON:  Prior CT from 01/23/2017. FINDINGS: Pharynx and larynx: Oral cavity within normal limits. Patient is edentulous. Palatine tonsils symmetric and within normal limits. Calcified tonsilliths noted. Remainder of the oropharynx and nasopharynx within normal limits. Retropharyngeal soft tissues within normal  limits. Epiglottis normal. Vallecula clear. Remainder of the hypopharynx and supraglottic larynx normal. Subglottic airway clear. Salivary glands: Unremarkable. Thyroid: Unremarkable. Lymph nodes: No pathologically enlarged lymph nodes identified within the neck. Vascular: Aortic atherosclerosis with prominent carotid bifurcation calcifications. Carotid siphon calcifications noted as well. Limited intracranial: Unremarkable. Visualized orbits: Visualized globes and orbital soft tissues within normal limits. Mastoids and visualized paranasal sinuses: Chronic left sphenoid sinusitis. Mastoids and middle ear cavities are clear. Skeleton: Postoperative changes from prior laminectomies at C3 through C5, with posterior instrumentation at C3 through C6. Hardware well positioned without complication. Multilevel degenerative spondylolysis again noted Upper chest: Visualized upper mediastinum within normal limits. Severe centrilobular and bullous emphysema. Other: Irregular gas and fluid collection along the mid midline posterior incision measures 4.8 x 1.1 x 6.8 cm (AP by transverse by craniocaudad). This lies at the level of C3 through C7. Mildly thickened rim enhancement about this collection as compared to previous adjacent paraspinous muscular edema slightly more prominent as compared to previous exam. IMPRESSION: 1. Persistent 4.8 x 1.1 x 6.8 cm gas and fluid containing paraspinal collection along the posterior surgical  approach. Again, finding may reflect persistent postoperative seroma or possibly abscess. Percutaneous sampling recommended if concern for infection. 2. Status post C3 through C5 laminectomy with posterior instrumentation at C3 through C6. 3. Atherosclerosis with severe emphysema. Electronically Signed   By: Jeannine Boga M.D.   On: 02/02/2017 01:17      Scheduled Meds: . enoxaparin (LOVENOX) injection  40 mg Subcutaneous Q24H  . feeding supplement (GLUCERNA SHAKE)  237 mL Oral BID BM  .  gabapentin  300 mg Oral BID  . insulin aspart  0-15 Units Subcutaneous TID WC  . insulin aspart protamine- aspart  15 Units Subcutaneous BID WC  . lisinopril  40 mg Oral Daily  . nicotine  14 mg Transdermal Daily  . pantoprazole  40 mg Oral BID   Continuous Infusions: . vancomycin 1,250 mg (02/02/17 1014)     LOS: 1 day    Time spent: Total of 25 minutes spent with pt, greater than 50% of which was spent in discussion of  treatment, counseling and coordination of care   Chipper Oman, MD Pager: Text Page via [email or web address]   If 7PM-7AM, please contact night-coverage [email or web address] 02/02/2017, 3:35 PM

## 2017-02-03 DIAGNOSIS — T847XXA Infection and inflammatory reaction due to other internal orthopedic prosthetic devices, implants and grafts, initial encounter: Secondary | ICD-10-CM

## 2017-02-03 DIAGNOSIS — M4622 Osteomyelitis of vertebra, cervical region: Secondary | ICD-10-CM

## 2017-02-03 DIAGNOSIS — B9562 Methicillin resistant Staphylococcus aureus infection as the cause of diseases classified elsewhere: Secondary | ICD-10-CM

## 2017-02-03 DIAGNOSIS — R7881 Bacteremia: Secondary | ICD-10-CM

## 2017-02-03 DIAGNOSIS — T847XXD Infection and inflammatory reaction due to other internal orthopedic prosthetic devices, implants and grafts, subsequent encounter: Secondary | ICD-10-CM

## 2017-02-03 LAB — GLUCOSE, CAPILLARY
GLUCOSE-CAPILLARY: 134 mg/dL — AB (ref 65–99)
GLUCOSE-CAPILLARY: 175 mg/dL — AB (ref 65–99)
GLUCOSE-CAPILLARY: 213 mg/dL — AB (ref 65–99)
Glucose-Capillary: 151 mg/dL — ABNORMAL HIGH (ref 65–99)

## 2017-02-03 LAB — VANCOMYCIN, TROUGH: VANCOMYCIN TR: 23 ug/mL — AB (ref 15–20)

## 2017-02-03 MED ORDER — VANCOMYCIN HCL IN DEXTROSE 1-5 GM/200ML-% IV SOLN
1000.0000 mg | Freq: Two times a day (BID) | INTRAVENOUS | Status: DC
  Administered 2017-02-04 – 2017-02-07 (×6): 1000 mg via INTRAVENOUS
  Filled 2017-02-03 (×7): qty 200

## 2017-02-03 MED ORDER — SENNOSIDES-DOCUSATE SODIUM 8.6-50 MG PO TABS
1.0000 | ORAL_TABLET | Freq: Two times a day (BID) | ORAL | Status: DC
  Administered 2017-02-03 – 2017-02-08 (×11): 1 via ORAL
  Filled 2017-02-03 (×11): qty 1

## 2017-02-03 MED ORDER — INFLUENZA VAC SPLIT HIGH-DOSE 0.5 ML IM SUSY
0.5000 mL | PREFILLED_SYRINGE | INTRAMUSCULAR | Status: DC
  Filled 2017-02-03: qty 0.5

## 2017-02-03 NOTE — Progress Notes (Addendum)
[facility]                                                                              Patient Demographics  Adrian Cross, is a 71 y.o. male, DOB - 1945-12-15, PJA:[id number]  Admit date - 02/01/2017   Admitting Physician Pratik Darleen Crocker, DO  Outpatient Primary MD for the patient is Iona Beard, MD  Outpatient specialists:   LOS - 2  days   Medical records reviewed and are as summarized below:    No chief complaint on file.      Brief summary   Patient is a 71 year old male with hypertension, diabetes type 2, recent laminectomy, fusion with hardware placement done at [facility], then presented to [facility] hospital with altered mental status and was found to have MRSA bacteremia.  Patient was found to have infection at the surgical site/cervical spine which was debrided by neurosurgery, per outside record hardware was not infected. Patient continued to have persistently blood cultures positive for MRSA and was transferred here for TEE, scheduled on 11/19.   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  Patient underwent I&D on 11/9 of the back abscess, cultures grew MRSA from the cervical wound. -Per outside report, hardware was not infected.  Blood cultures persistently grew MRSA - Patient had a PICC line which was removed PTA - Repeat CT scan showed persistent 4.8 x1.1 x6.8 cm gas and fluid filled paraspinal collection along the posterior surgical site -IR and ID have recommended neurosurgical evaluation, d/w Dr Kathyrn Sheriff (neurosurgery) this AM, will await recommendations  -ID following, continue vancomycin, TEE on 11/19 scheduled  Hypertension BP stable, continue lisinopril, hydralazine as needed  Diabetes type 2 with neuropathy -Poorly controlled A1c 12.3, -CBGs inpatient controlled, continue insulin  75/25 15units BID  Tobacco use -Patient recommended to quit smoking, continue nicotine patch   Code Status: Full CODE STATUS DVT Prophylaxis:  Lovenox Family Communication: Discussed in detail with the patient, all imaging results, lab results explained to the patient   Disposition Plan:   Time Spent in minutes  25 minutes  Procedures:    Consultants:   IR Infectious disease Neurosurgery  Antimicrobials:   IV vancomycin   Medications  Scheduled Meds: . enoxaparin (LOVENOX) injection  40 mg Subcutaneous Q24H  . feeding supplement (GLUCERNA SHAKE)  237 mL Oral BID BM  . gabapentin  300 mg Oral BID  . insulin aspart  0-15 Units Subcutaneous TID WC  . insulin aspart protamine- aspart  15 Units Subcutaneous BID WC  . lisinopril  40 mg Oral Daily  . nicotine  14 mg Transdermal Daily  . pantoprazole  40 mg Oral BID   Continuous Infusions: . vancomycin 1,250 mg (02/03/17 0943)   PRN Meds:.acetaminophen **OR** acetaminophen, hydrALAZINE, polyethylene glycol, senna-docusate, traMADol   Antibiotics   Anti-infectives (From admission, onward)   Start     Dose/Rate Route Frequency Ordered Stop   02/02/17 0800  vancomycin (VANCOCIN) 1,250 mg in sodium chloride 0.9 % 250 mL IVPB  1,250 mg 166.7 mL/hr over 90 Minutes Intravenous Every 12 hours 02/02/17 0519     02/01/17 1900  vancomycin (VANCOCIN) 1,250 mg in sodium chloride 0.9 % 250 mL IVPB  Status:  Discontinued     1,250 mg 166.7 mL/hr over 90 Minutes Intravenous Every 12 hours 02/01/17 1846 02/02/17 0519        Subjective:   Adrian Cross was seen and examined today.  Pain in the back 6/10, intermittent, no fevers or chills. Patient denies dizziness, chest pain, shortness of breath, abdominal pain, N/V/D/C. No acute events overnight.  Mild weakness in the left hand grip, per patient PTA before surgery   Objective:   Vitals:   02/01/17 2136 02/02/17 0539 02/02/17 1523 02/02/17 2221  BP: 127/65 (!) 146/69 (!)  158/63 131/73  Pulse: 83 60 66 66  Resp: 18 17 17 20   Temp: 98.5 F (36.9 C) 97.8 F (36.6 C) 98.2 F (36.8 C) 99 F (37.2 C)  TempSrc: Oral Oral Oral Oral  SpO2: 97% 99%  96%  Weight:      Height:        Intake/Output Summary (Last 24 hours) at 02/03/2017 1135 Last data filed at 02/03/2017 0534 Gross per 24 hour  Intake 470 ml  Output 1060 ml  Net -590 ml     Wt Readings from Last 3 Encounters:  02/01/17 65.9 kg (145 lb 4.8 oz)  11/29/16 77.8 kg (171 lb 9.6 oz)  10/30/13 70.6 kg (155 lb 10.3 oz)     Exam  General: Alert and oriented x 3, NAD  Eyes:  HEENT:  Atraumatic, normocephalic  Cardiovascular: S1 S2 auscultated, no rubs, murmurs or gallops. Regular rate and rhythm.  Respiratory: Clear to auscultation bilaterally, no wheezing, rales or rhonchi  Gastrointestinal: Soft, nontender, nondistended, + bowel sounds  Ext: no pedal edema bilaterally  Neuro: mild weakness in the left hand grip   Musculoskeletal: No digital cyanosis, clubbing  Skin: No rashes  Psych: Normal affect and demeanor, alert and oriented x3    Data Reviewed:  I have personally reviewed following labs and imaging studies  Micro Results No results found for this or any previous visit (from the past 240 hour(s)).  Radiology Reports Ct Soft Tissue Neck W Contrast  Result Date: 02/02/2017 CLINICAL DATA:  Initial evaluation for bacteremia, back pain, infection of the neck at site of incision. EXAM: CT NECK WITH CONTRAST TECHNIQUE: Multidetector CT imaging of the neck was performed using the standard protocol following the bolus administration of intravenous contrast. CONTRAST:  <See Chart> ISOVUE-300 IOPAMIDOL (ISOVUE-300) INJECTION 61% COMPARISON:  Prior CT from 01/23/2017. FINDINGS: Pharynx and larynx: Oral cavity within normal limits. Patient is edentulous. Palatine tonsils symmetric and within normal limits. Calcified tonsilliths noted. Remainder of the oropharynx and nasopharynx  within normal limits. Retropharyngeal soft tissues within normal limits. Epiglottis normal. Vallecula clear. Remainder of the hypopharynx and supraglottic larynx normal. Subglottic airway clear. Salivary glands: Unremarkable. Thyroid: Unremarkable. Lymph nodes: No pathologically enlarged lymph nodes identified within the neck. Vascular: Aortic atherosclerosis with prominent carotid bifurcation calcifications. Carotid siphon calcifications noted as well. Limited intracranial: Unremarkable. Visualized orbits: Visualized globes and orbital soft tissues within normal limits. Mastoids and visualized paranasal sinuses: Chronic left sphenoid sinusitis. Mastoids and middle ear cavities are clear. Skeleton: Postoperative changes from prior laminectomies at C3 through C5, with posterior instrumentation at C3 through C6. Hardware well positioned without complication. Multilevel degenerative spondylolysis again noted Upper chest: Visualized upper mediastinum within normal limits. Severe centrilobular and bullous  emphysema. Other: Irregular gas and fluid collection along the mid midline posterior incision measures 4.8 x 1.1 x 6.8 cm (AP by transverse by craniocaudad). This lies at the level of C3 through C7. Mildly thickened rim enhancement about this collection as compared to previous adjacent paraspinous muscular edema slightly more prominent as compared to previous exam. IMPRESSION: 1. Persistent 4.8 x 1.1 x 6.8 cm gas and fluid containing paraspinal collection along the posterior surgical approach. Again, finding may reflect persistent postoperative seroma or possibly abscess. Percutaneous sampling recommended if concern for infection. 2. Status post C3 through C5 laminectomy with posterior instrumentation at C3 through C6. 3. Atherosclerosis with severe emphysema. Electronically Signed   By: Jeannine Boga M.D.   On: 02/02/2017 01:17    Lab Data:  CBC: Recent Labs  Lab 02/01/17 1926 02/02/17 0410  WBC 8.0  7.0  NEUTROABS 4.7  --   HGB 10.6* 10.0*  HCT 32.1* 30.8*  MCV 90.9 91.4  PLT 228 161   Basic Metabolic Panel: Recent Labs  Lab 02/01/17 1926 02/02/17 0410  NA 135 135  K 3.5 3.9  CL 103 104  CO2 25 26  GLUCOSE 157* 236*  BUN 6 9  CREATININE 0.78 0.87  CALCIUM 9.6 9.4   GFR: Estimated Creatinine Clearance: 67.1 mL/min (by C-G formula based on SCr of 0.87 mg/dL). Liver Function Tests: Recent Labs  Lab 02/01/17 1926  AST 18  ALT 24  ALKPHOS 66  BILITOT 0.5  PROT 6.9  ALBUMIN 2.6*   No results for input(s): LIPASE, AMYLASE in the last 168 hours. No results for input(s): AMMONIA in the last 168 hours. Coagulation Profile: No results for input(s): INR, PROTIME in the last 168 hours. Cardiac Enzymes: No results for input(s): CKTOTAL, CKMB, CKMBINDEX, TROPONINI in the last 168 hours. BNP (last 3 results) No results for input(s): PROBNP in the last 8760 hours. HbA1C: Recent Labs    02/01/17 1926  HGBA1C 12.3*   CBG: Recent Labs  Lab 02/02/17 1009 02/02/17 1150 02/02/17 1659 02/02/17 2108 02/03/17 0759  GLUCAP 148* 137* 110* 128* 134*   Lipid Profile: No results for input(s): CHOL, HDL, LDLCALC, TRIG, CHOLHDL, LDLDIRECT in the last 72 hours. Thyroid Function Tests: No results for input(s): TSH, T4TOTAL, FREET4, T3FREE, THYROIDAB in the last 72 hours. Anemia Panel: No results for input(s): VITAMINB12, FOLATE, FERRITIN, TIBC, IRON, RETICCTPCT in the last 72 hours. Urine analysis:    Component Value Date/Time   COLORURINE STRAW (A) 11/29/2016 0933   APPEARANCEUR CLEAR 11/29/2016 0933   LABSPEC 1.023 11/29/2016 0933   PHURINE 8.0 11/29/2016 0933   GLUCOSEU >=500 (A) 11/29/2016 0933   HGBUR NEGATIVE 11/29/2016 0933   BILIRUBINUR NEGATIVE 11/29/2016 0933   KETONESUR 20 (A) 11/29/2016 0933   PROTEINUR NEGATIVE 11/29/2016 0933   NITRITE NEGATIVE 11/29/2016 0933   LEUKOCYTESUR NEGATIVE 11/29/2016 0933     Adrian Cross M.D. [facility] 02/03/2017,  11:35 AM  Pager: [phone] Between 7am to 7pm - call Pager - [phone]  After 7pm go to [email or web address] - password [email or web address]  Call night coverage person covering after 7pm

## 2017-02-03 NOTE — Consult Note (Signed)
Chief Complaint  Wound infection  History of Present Illness  Adrian Cross is a 71 y.o. male admitted to [facility] as a transfer from [facility] apparently for TEE. He has a history of fall in the setting of cervical spondylosis. He underwent cervical laminectomy and lateral mass fusion on 01/08/17 by Dr. Carloyn Manner. About 2 weeks later he developed acute mental status change and presented to [facility] where he was diagnosed with MRSA bacteremia and neck infection was the presumed source. He was transferred back to [facility] where Dr. Carloyn Manner took him back to the OR on 01/26/17 for wound washout where frank pus was identified. He apparently remained bacteremic and was tranferred to [facility] for TEE.   Past Medical History   Past Medical History:  Diagnosis Date  . Arthritis    "all over" (02/01/2017)  . Chronic back pain   . GERD (gastroesophageal reflux disease)   . Hypertension   . Omental mass 2015   abd abscess  . Pinched nerve    "back" (10/30/2013)  . Stomach ulcer   . Type II diabetes mellitus ([place])     Past Surgical History   Past Surgical History:  Procedure Laterality Date  . CARPAL TUNNEL RELEASE Left 03/2013  . ESOPHAGOGASTRODUODENOSCOPY (EGD) N/A 11/30/2016   Performed by Rogene Houston, MD at [place]  . EXPLORATORY LAPAROTOMY for resection of omental mass N/A 11/03/2013   Performed by Donnie Mesa, MD at [place]  . INCISION AND DRAINAGE ABSCESS POSTERIOR CERVICALSPINE  01/2017   growing MRSA/notes 02/01/2017  . KNEE ARTHROSCOPY Left 1980's  . PICC LINE INSERTION  01/2017   Archie Endo 02/01/2017  . POSTERIOR FUSION CERVICAL SPINE  01/08/2017   C3-C4, C4-C5, C5-C6 posterior fusion and C3, C4, C5 cervical laminectomy/notes 01/22/2017    Social History   Social History   Tobacco Use  . Smoking status: Former Smoker    Packs/day: 1.00    Years: 54.00    Pack years: 54.00    Types: Cigarettes    Last attempt to quit: 12/18/2016    Years since quitting: 0.1    . Smokeless tobacco: Never Used  Substance Use Topics  . Alcohol use: No  . Drug use: No    Medications   Prior to Admission medications   Medication Sig Start Date End Date Taking? Authorizing Provider  gabapentin (NEURONTIN) 300 MG capsule Take 300 mg by mouth 2 (two) times daily.   Yes [provider]  lisinopril (PRINIVIL,ZESTRIL) 40 MG tablet Take 40 mg by mouth daily. 09/27/13  Yes [provider]  pantoprazole (PROTONIX) 40 MG tablet Take 1 tablet (40 mg total) by mouth 2 (two) times daily. 12/01/16 02/02/17 Yes Reyne Dumas, MD  glyBURIDE (DIABETA) 5 MG tablet Take 1 tablet (5 mg total) by mouth 2 (two) times daily with a meal. 12/01/16 12/11/16  Reyne Dumas, MD  Insulin Lispro Prot & Lispro (HUMALOG MIX 75/25 KWIKPEN) (75-25) 100 UNIT/ML Kwikpen Inject 3 Units into the skin 2 (two) times daily with a meal. With breakfast and supper 12/01/16   Reyne Dumas, MD  methocarbamol (ROBAXIN) 500 MG tablet Take 1 tablet (500 mg total) by mouth every 6 (six) hours as needed for muscle spasms. 12/01/16   Reyne Dumas, MD    Allergies  No Known Allergies  Review of Systems  ROS  Neurologic Exam  Awake, alert, oriented Memory and concentration grossly intact Speech fluent, appropriate CN grossly intact Motor exam: Upper Extremities Deltoid Bicep Tricep  Grip  Right 5/5 5/5 5/5 4/5  Left 5/5 5/5 5/5 4/5   Lower Extremities IP Quad PF DF EHL  Right 5/5 5/5 5/5 5/5 5/5  Left 5/5 5/5 5/5 5/5 5/5   Sensation grossly intact to LT  Imaging  CT reviewed, does demonstrate   Impression  - 71 y.o. male with bacteremia s/p cervical laminectomy/lateral mass fusion and subsequent washout 8 days ago. Repeat blood cultures, TEE are still pending. At this point, I believe a second wound washout to be un-indicated. As far as I'm concerned, the only reason to consider a second washout would be if there were no identified pathogenic organism, or the patient developed progressive  neurologic decline from mass effect. He has neither of these. In addition, if we were to consider such an operation it should be done by his primary neurosurgeon, Dr. Carloyn Manner at [place] current abx regimen - F/U blood cultures - TEE scheduled 11/19

## 2017-02-03 NOTE — Evaluation (Signed)
Physical Therapy Evaluation Patient Details Name: Adrian Cross MRN: [id number] DOB: 1946-01-02 Today's Date: 02/03/2017   History of Present Illness  71 year old male with hypertension, diabetes type 2, recent laminectomy, fusion with hardware placement done at [facility], then presented to [facility] hospital with altered mental status and was found to have MRSA bacteremia.  Patient was found to have infection at the surgical site/cervical spine which was debrided by neurosurgery, per outside record hardware was not infected. Patient continued to have persistently blood cultures positive for MRSA and was transferred here for TEE, scheduled on 11/19.  Clinical Impression  Orders received for PT evaluation. Patient demonstrates deficits in functional mobility as indicated below. Will benefit from continued skilled PT to address deficits and maximize function. Will see as indicated and progress as tolerated.  Recommend return to rehabilitation center to finish therapies.    Follow Up Recommendations SNF;Supervision/Assistance - 24 hour(return to rehabilitation center to finish therapies)    Equipment Recommendations  None recommended by PT    Recommendations for Other Services       Precautions / Restrictions Precautions Precautions: Fall Restrictions Weight Bearing Restrictions: No      Mobility  Bed Mobility Overal bed mobility: Needs Assistance Bed Mobility: Supine to Sit     Supine to sit: Min assist     General bed mobility comments: min assist to power trunk to upright at EOB  Transfers Overall transfer level: Needs assistance Equipment used: Rolling walker (2 wheeled) Transfers: Sit to/from Stand Sit to Stand: Min assist         General transfer comment: Min assist to power up and provide stability.   Ambulation/Gait Ambulation/Gait assistance: Min guard;Min assist Ambulation Distance (Feet): 110 Feet Assistive device: Rolling walker (2  wheeled) Gait Pattern/deviations: Step-through pattern;Decreased stride length;Trunk flexed Gait velocity: decreased Gait velocity interpretation: <1.8 ft/sec, indicative of risk for recurrent falls General Gait Details: Patient with noted RLE weakness, hip fatigue creating two episodes of RLE buckling requiring increased assist, otherwise min guard for safety  Stairs            Wheelchair Mobility    Modified Rankin (Stroke Patients Only)       Balance Overall balance assessment: Needs assistance   Sitting balance-Leahy Scale: Fair     Standing balance support: During functional activity Standing balance-Leahy Scale: Poor Standing balance comment: reliance on UE support                             Pertinent Vitals/Pain Pain Assessment: 0-10 Pain Score: 6  Pain Location: neck Pain Descriptors / Indicators: Sore;Discomfort Pain Intervention(s): Monitored during session    Home Living Family/patient expects to be discharged to:: Private residence Living Arrangements: Children Available Help at Discharge: Family;Available PRN/intermittently Type of Home: Mobile home Home Access: Ramped entrance     Home Layout: One level Home Equipment: None      Prior Function Level of Independence: Needs assistance   Gait / Transfers Assistance Needed: was ambulating with RW and assist from therapy team  ADL's / Homemaking Assistance Needed: was having assist with bathing and ADLs        Hand Dominance   Dominant Hand: Left    Extremity/Trunk Assessment   Upper Extremity Assessment Upper Extremity Assessment: Generalized weakness    Lower Extremity Assessment Lower Extremity Assessment: Generalized weakness;RLE deficits/detail RLE Deficits / Details: patient reports RLE weakness during mobility states that hip fatigues and  causes RLE buckling, noted x2 during todays session RLE Coordination: decreased fine motor;decreased gross motor        Communication   Communication: No difficulties  Cognition Arousal/Alertness: Awake/alert Behavior During Therapy: WFL for tasks assessed/performed Overall Cognitive Status: Within Functional Limits for tasks assessed                                        General Comments      Exercises     Assessment/Plan    PT Assessment Patient needs continued PT services  PT Problem List Decreased strength;Decreased activity tolerance;Decreased balance;Decreased coordination;Decreased mobility;Pain;Decreased skin integrity       PT Treatment Interventions DME instruction;Gait training;Stair training;Functional mobility training;Therapeutic activities;Therapeutic exercise;Neuromuscular re-education;Balance training;Patient/family education    PT Goals (Current goals can be found in the Care Plan section)  Acute Rehab PT Goals Patient Stated Goal: to get back home PT Goal Formulation: With patient Time For Goal Achievement: 02/16/17 Potential to Achieve Goals: Good    Frequency Min 3X/week   Barriers to discharge        Co-evaluation               AM-PAC PT "6 Clicks" Daily Activity  Outcome Measure Difficulty turning over in bed (including adjusting bedclothes, sheets and blankets)?: A Little Difficulty moving from lying on back to sitting on the side of the bed? : Unable Difficulty sitting down on and standing up from a chair with arms (e.g., wheelchair, bedside commode, etc,.)?: Unable Help needed moving to and from a bed to chair (including a wheelchair)?: A Little Help needed walking in hospital room?: A Little Help needed climbing 3-5 steps with a railing? : A Lot 6 Click Score: 13    End of Session Equipment Utilized During Treatment: Gait belt Activity Tolerance: Patient tolerated treatment well Patient left: Other (comment)(in bathroom tech aware) Nurse Communication: Mobility status PT Visit Diagnosis: Unsteadiness on feet (R26.81);Difficulty in  walking, not elsewhere classified (R26.2)    Time: 1359-1420 PT Time Calculation (min) (ACUTE ONLY): 21 min   Charges:   PT Evaluation $PT Eval Moderate Complexity: 1 Mod     PT G Codes:        Alben Deeds, PT DPT  Board Certified Neurologic Specialist [place] 02/03/2017, 4:23 PM

## 2017-02-03 NOTE — Progress Notes (Signed)
Subjective: No new complaints   Antibiotics:  Anti-infectives (From admission, onward)   Start     Dose/Rate Route Frequency Ordered Stop   02/02/17 0800  vancomycin (VANCOCIN) 1,250 mg in sodium chloride 0.9 % 250 mL IVPB     1,250 mg 166.7 mL/hr over 90 Minutes Intravenous Every 12 hours 02/02/17 0519     02/01/17 1900  vancomycin (VANCOCIN) 1,250 mg in sodium chloride 0.9 % 250 mL IVPB  Status:  Discontinued     1,250 mg 166.7 mL/hr over 90 Minutes Intravenous Every 12 hours 02/01/17 1846 02/02/17 0519      Medications: Scheduled Meds: . enoxaparin (LOVENOX) injection  40 mg Subcutaneous Q24H  . feeding supplement (GLUCERNA SHAKE)  237 mL Oral BID BM  . gabapentin  300 mg Oral BID  . insulin aspart  0-15 Units Subcutaneous TID WC  . insulin aspart protamine- aspart  15 Units Subcutaneous BID WC  . lisinopril  40 mg Oral Daily  . nicotine  14 mg Transdermal Daily  . pantoprazole  40 mg Oral BID   Continuous Infusions: . vancomycin Stopped (02/03/17 0045)   PRN Meds:.acetaminophen **OR** acetaminophen, hydrALAZINE, polyethylene glycol, senna-docusate, traMADol    Objective: Weight change:   Intake/Output Summary (Last 24 hours) at 02/03/2017 0934 Last data filed at 02/03/2017 0534 Gross per 24 hour  Intake 690 ml  Output 1060 ml  Net -370 ml   Blood pressure 131/73, pulse 66, temperature 99 F (37.2 C), temperature source Oral, resp. rate 20, height 5\' 4"  (1.626 m), weight 145 lb 4.8 oz (65.9 kg), SpO2 96 %. Temp:  [98.2 F (36.8 C)-99 F (37.2 C)] 99 F (37.2 C) (11/16 2221) Pulse Rate:  [66] 66 (11/16 2221) Resp:  [17-20] 20 (11/16 2221) BP: (131-158)/(63-73) 131/73 (11/16 2221) SpO2:  [96 %] 96 % (11/16 2221)  Physical Exam: General: Alert and awake, oriented x3, not in any acute distress. HEENT: anicteric sclera, pupils reactive to light and accommodation, EOMI CVS regular rate, normal r,  no murmur rubs or gallops Chest: clear to  auscultation bilaterally, no wheezing, rales or rhonchi Abdomen: soft nontender, nondistended, normal bowel sounds, Extremities: no  clubbing or edema noted bilaterally  Neuro: he has some weakness in left hand grip and also with extension that he says predates his original surgery  CBC:  CBC Latest Ref Rng & Units 02/02/2017 02/01/2017 12/01/2016  WBC 4.0 - 10.5 K/uL 7.0 8.0 5.5  Hemoglobin 13.0 - 17.0 g/dL 10.0(L) 10.6(L) 14.4  Hematocrit 39.0 - 52.0 % 30.8(L) 32.1(L) 41.2  Platelets 150 - 400 K/uL 226 228 149(L)      BMET Recent Labs    02/01/17 1926 02/02/17 0410  NA 135 135  K 3.5 3.9  CL 103 104  CO2 25 26  GLUCOSE 157* 236*  BUN 6 9  CREATININE 0.78 0.87  CALCIUM 9.6 9.4     Liver Panel  Recent Labs    02/01/17 1926  PROT 6.9  ALBUMIN 2.6*  AST 18  ALT 24  ALKPHOS 66  BILITOT 0.5       Sedimentation Rate No results for input(s): ESRSEDRATE in the last 72 hours. C-Reactive Protein No results for input(s): CRP in the last 72 hours.  Micro Results: No results found for this or any previous visit (from the past 720 hour(s)).  Studies/Results: Ct Soft Tissue Neck W Contrast  Result Date: 02/02/2017 CLINICAL DATA:  Initial evaluation for bacteremia, back pain, infection of  the neck at site of incision. EXAM: CT NECK WITH CONTRAST TECHNIQUE: Multidetector CT imaging of the neck was performed using the standard protocol following the bolus administration of intravenous contrast. CONTRAST:  <See Chart> ISOVUE-300 IOPAMIDOL (ISOVUE-300) INJECTION 61% COMPARISON:  Prior CT from 01/23/2017. FINDINGS: Pharynx and larynx: Oral cavity within normal limits. Patient is edentulous. Palatine tonsils symmetric and within normal limits. Calcified tonsilliths noted. Remainder of the oropharynx and nasopharynx within normal limits. Retropharyngeal soft tissues within normal limits. Epiglottis normal. Vallecula clear. Remainder of the hypopharynx and supraglottic larynx  normal. Subglottic airway clear. Salivary glands: Unremarkable. Thyroid: Unremarkable. Lymph nodes: No pathologically enlarged lymph nodes identified within the neck. Vascular: Aortic atherosclerosis with prominent carotid bifurcation calcifications. Carotid siphon calcifications noted as well. Limited intracranial: Unremarkable. Visualized orbits: Visualized globes and orbital soft tissues within normal limits. Mastoids and visualized paranasal sinuses: Chronic left sphenoid sinusitis. Mastoids and middle ear cavities are clear. Skeleton: Postoperative changes from prior laminectomies at C3 through C5, with posterior instrumentation at C3 through C6. Hardware well positioned without complication. Multilevel degenerative spondylolysis again noted Upper chest: Visualized upper mediastinum within normal limits. Severe centrilobular and bullous emphysema. Other: Irregular gas and fluid collection along the mid midline posterior incision measures 4.8 x 1.1 x 6.8 cm (AP by transverse by craniocaudad). This lies at the level of C3 through C7. Mildly thickened rim enhancement about this collection as compared to previous adjacent paraspinous muscular edema slightly more prominent as compared to previous exam. IMPRESSION: 1. Persistent 4.8 x 1.1 x 6.8 cm gas and fluid containing paraspinal collection along the posterior surgical approach. Again, finding may reflect persistent postoperative seroma or possibly abscess. Percutaneous sampling recommended if concern for infection. 2. Status post C3 through C5 laminectomy with posterior instrumentation at C3 through C6. 3. Atherosclerosis with severe emphysema. Electronically Signed   By: Jeannine Boga M.D.   On: 02/02/2017 01:17      Assessment/Plan:  INTERVAL HISTORY:   CT reviewed by me and does indeed show fluid collection measured by radiology at  4.8 x 1.1 x 6.8 cm gas and fluid    Active Problems:   Bacteremia    Adrian Cross is a 71 y.o. 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 He has FAILED to clear his bacteremia       [place] Antimicrobial Management Team Staphylococcus aureus bacteremia   Staphylococcus aureus bacteremia (SAB) is associated with a high rate of complications and mortality.  Specific aspects of clinical management are critical to optimizing the outcome of patients with SAB.  Therefore, the [facility] Antimicrobial Management Team [facility]) has initiated an intervention aimed at improving the management of SAB at [facility].  To do so, Infectious Diseases physicians are providing an evidence-based consult for the management of all patients with SAB.     Yes No Comments  Perform follow-up blood cultures (even if the patient is afebrile) to ensure clearance of bacteremia [x]  []    Remove vascular catheter and obtain follow-up blood cultures after the removal of the catheter []  []  DO NOT PLACE PICC OR ANY CENTRAL LINES  Perform echocardiography to evaluate for endocarditis (transthoracic ECHO is 40-50% sensitive, TEE is > 90% sensitive) []  []  Please keep in mind, that neither test can definitively EXCLUDE endocarditis, and that should clinical suspicion remain high for endocarditis the patient should then still be treated with an "endocarditis" duration of therapy = 6 weeks  TEE ON [facility]  Consult electrophysiologist to evaluate implanted cardiac device (pacemaker, ICD) []  []    Ensure source control []  []  Have all abscesses been drained effectively? Have deep seeded infections (septic joints or osteomyelitis) had appropriate surgical debridement?  NO HE APPEARS TO NEED REPEAT SURGERY TO THE C  SPINE AND NEUROSURGERY CONSULT IS PENDING    Investigate for "metastatic" sites of infection []  []  Does the patient have ANY symptom or physical exam finding that would suggest a deeper infection (back or neck pain that may be suggestive of vertebral osteomyelitis or  epidural abscess, muscle pain that could be a symptom of pyomyositis)?  Keep in mind that for deep seeded infections MRI imaging with contrast is preferred rather than other often insensitive tests such as plain x-rays, especially early in a patient's presentation.  Change antibiotic therapy to VANCOMYCIN []  []  Beta-lactam antibiotics are preferred for MSSA due to higher cure rates.   If on Vancomycin, goal trough should be 15 - 20 mcg/mL  Estimated duration of IV antibiotic therapy:  8 WEEKS []  []  Consult case management for probably prolonged outpatient IV antibiotic therapy       LOS: 2 days   Alcide Evener 02/03/2017, 9:34 AM

## 2017-02-03 NOTE — Progress Notes (Signed)
CRITICAL VALUE ALERT  Critical Value: vanco trough 23  Date & Time Notied: 02/03/17 @ 0315  Provider Notified: Pharmacy notified  Orders Received/Actions taken: Pharmacy will adjust next dose.

## 2017-02-03 NOTE — Plan of Care (Signed)
Patient progressing 

## 2017-02-03 NOTE — Progress Notes (Signed)
Pharmacy Antibiotic Note  Adrian Cross is a 71 y.o. male admitted on 02/01/2017 with MRSA bacteremia.  Pharmacy has been consulted for vancomycin dosing.  Vanc trough above goal.  Plan: Change vancomycin to 1000mg  IV every 12 hours for calculated trough ~18.  Goal trough 15-20 mcg/mL.  Height: 5\' 4"  (162.6 cm) Weight: 145 lb 4.8 oz (65.9 kg) IBW/kg (Calculated) : 59.2  Temp (24hrs), Avg:99 F (37.2 C), Min:98.1 F (36.7 C), Max:99.9 F (37.7 C)  Recent Labs  Lab 02/01/17 1926 02/02/17 0410 02/03/17 2108  WBC 8.0 7.0  --   CREATININE 0.78 0.87  --   VANCOTROUGH  --   --  23*    Estimated Creatinine Clearance: 67.1 mL/min (by C-G formula based on SCr of 0.87 mg/dL).    No Known Allergies  Antimicrobials this admission: Vanc 11/8 >>   Dose adjustments this admission: 11/11 trough = 14 (drawn early- suspected lower per discussion w/ Illene Regulus at [facility], on 1g q12, SCr  >> increased to 1250 q12) 11/13 trough =  15.5 (continued 1250 q12) 11/17 trough = 23 (lowered to 1000mg  q12)  Microbiology results: 11/15 BCx: px Blood ([facility]) 11/8 - 1/2 MRSA Cervical Wound 11/9 - MRSA Blood 11/11- 1/2 MRSA  Thank you for allowing pharmacy to be a part of this patient's care.  Wynona Neat, PharmD, BCPS  02/03/2017 11:48 PM

## 2017-02-04 LAB — GLUCOSE, CAPILLARY
GLUCOSE-CAPILLARY: 140 mg/dL — AB (ref 65–99)
Glucose-Capillary: 170 mg/dL — ABNORMAL HIGH (ref 65–99)
Glucose-Capillary: 231 mg/dL — ABNORMAL HIGH (ref 65–99)
Glucose-Capillary: 162 mg/dL — ABNORMAL HIGH (ref 65–99)

## 2017-02-04 LAB — HIV ANTIBODY (ROUTINE TESTING W REFLEX): HIV SCREEN 4TH GENERATION: NONREACTIVE

## 2017-02-04 NOTE — Progress Notes (Signed)
[facility]                                                                              Patient Demographics  Adrian Cross, is a 71 y.o. male, DOB - May 20, 1945, EHU:[id number]  Admit date - 02/01/2017   Admitting Physician Pratik Darleen Crocker, DO  Outpatient Primary MD for the patient is Iona Beard, MD  Outpatient specialists:   LOS - 3  days   Medical records reviewed and are as summarized below:    No chief complaint on file.      Brief summary   Patient is a 71 year old male with hypertension, diabetes type 2, recent laminectomy, fusion with hardware placement done at [facility], then presented to [facility] hospital with altered mental status and was found to have MRSA bacteremia.  Patient was found to have infection at the surgical site/cervical spine which was debrided by neurosurgery, per outside record hardware was not infected. Patient continued to have persistently blood cultures positive for MRSA and was transferred here for TEE, scheduled on 11/19.   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  Patient underwent I&D on 11/9 of the back abscess, cultures grew MRSA from the cervical wound. -Per outside report, hardware was not infected.  Blood cultures persistently grew MRSA. Patient had a PICC line which was removed PTA - Repeat CT scan showed persistent 4.8 x1.1 x6.8 cm gas and fluid filled paraspinal collection along the posterior surgical site - ID following, continue vancomycin, TEE on 11/19 scheduled -Blood cultures 11/15 so far negative -Seen by neurosurgery, recommended continue antibiotics, follow blood cultures and TEE.  Currently second wound washout is not indicated unless patient develops progressive neurological decline from the mass-effect.  In addition, recommended should be done by his  primary neurosurgeon at [facility], Dr. Carloyn Manner - NPO after MN  Hypertension BP stable, continue lisinopril, hydralazine as needed  Diabetes type 2 with neuropathy -Poorly controlled A1c 12.3, -CBGs somewhat elevated however patient will be n.p.o. after midnight hence no changes in the insulin regimen today   Tobacco use -Patient recommended to quit smoking, continue nicotine patch   Code Status: Full CODE STATUS DVT Prophylaxis:  Lovenox Family Communication: Discussed in detail with the patient, all imaging results, lab results explained to the patient   Disposition Plan:   Time Spent in minutes  25 minutes  Procedures:    Consultants:   IR Infectious disease Neurosurgery  Antimicrobials:   IV vancomycin   Medications  Scheduled Meds: . enoxaparin (LOVENOX) injection  40 mg Subcutaneous Q24H  . feeding supplement (GLUCERNA SHAKE)  237 mL Oral BID BM  . gabapentin  300 mg Oral BID  . Influenza vac split quadrivalent PF  0.5 mL Intramuscular Tomorrow-1000  . insulin aspart  0-15 Units Subcutaneous TID WC  . insulin aspart protamine- aspart  15 Units Subcutaneous BID WC  . lisinopril  40 mg Oral Daily  . nicotine  14 mg Transdermal Daily  . pantoprazole  40  mg Oral BID  . senna-docusate  1 tablet Oral BID   Continuous Infusions: . vancomycin     PRN Meds:.acetaminophen **OR** acetaminophen, hydrALAZINE, polyethylene glycol, traMADol   Antibiotics   Anti-infectives (From admission, onward)   Start     Dose/Rate Route Frequency Ordered Stop   02/04/17 1200  vancomycin (VANCOCIN) IVPB 1000 mg/200 mL premix     1,000 mg 200 mL/hr over 60 Minutes Intravenous Every 12 hours 02/03/17 2347     02/02/17 0800  vancomycin (VANCOCIN) 1,250 mg in sodium chloride 0.9 % 250 mL IVPB  Status:  Discontinued     1,250 mg 166.7 mL/hr over 90 Minutes Intravenous Every 12 hours 02/02/17 0519 02/03/17 2345   02/01/17 1900  vancomycin (VANCOCIN) 1,250 mg in sodium  chloride 0.9 % 250 mL IVPB  Status:  Discontinued     1,250 mg 166.7 mL/hr over 90 Minutes Intravenous Every 12 hours 02/01/17 1846 02/02/17 0519        Subjective:   Adrian Cross was seen and examined today.  Pain currently controlled, no acute issues overnight, no fevers.  Patient denies dizziness, chest pain, shortness of breath, abdominal pain, N/V/D/C.  Objective:   Vitals:   02/03/17 0830 02/03/17 1455 02/03/17 2100 02/04/17 0616  BP:  (!) 166/70 136/73 (!) 167/65  Pulse:  83 87 61  Resp:  20 20 20   Temp:  98.1 F (36.7 C) 99.9 F (37.7 C) 98.2 F (36.8 C)  TempSrc:  Oral Oral Oral  SpO2: 97% 97% 98% 100%  Weight:      Height:        Intake/Output Summary (Last 24 hours) at 02/04/2017 1108 Last data filed at 02/04/2017 0602 Gross per 24 hour  Intake -  Output 1520 ml  Net -1520 ml     Wt Readings from Last 3 Encounters:  02/01/17 65.9 kg (145 lb 4.8 oz)  11/29/16 77.8 kg (171 lb 9.6 oz)  10/30/13 70.6 kg (155 lb 10.3 oz)     Exam   General: Alert and oriented x 3, NAD  Eyes:   HEENT:    Cardiovascular: S1 S2 clear, RRR No pedal edema b/l  Respiratory: CTAB   Gastrointestinal: Soft, nontender, nondistended, + bowel sounds  Ext: no pedal edema bilaterally  Neuro: no new deficits  Musculoskeletal: No digital cyanosis, clubbing  Skin: No rashes  Psych: Normal affect and demeanor, alert and oriented x3    Data Reviewed:  I have personally reviewed following labs and imaging studies  Micro Results Recent Results (from the past 240 hour(s))  Culture, blood (Routine X 2) w Reflex to ID Panel     Status: None (Preliminary result)   Collection Time: 02/01/17  7:26 PM  Result Value Ref Range Status   Specimen Description BLOOD LEFT ARM  Final   Special Requests IN PEDIATRIC BOTTLE Blood Culture adequate volume  Final   Culture NO GROWTH 2 DAYS  Final   Report Status PENDING  Incomplete    Radiology Reports Ct Soft Tissue Neck W  Contrast  Result Date: 02/02/2017 CLINICAL DATA:  Initial evaluation for bacteremia, back pain, infection of the neck at site of incision. EXAM: CT NECK WITH CONTRAST TECHNIQUE: Multidetector CT imaging of the neck was performed using the standard protocol following the bolus administration of intravenous contrast. CONTRAST:  <See Chart> ISOVUE-300 IOPAMIDOL (ISOVUE-300) INJECTION 61% COMPARISON:  Prior CT from 01/23/2017. FINDINGS: Pharynx and larynx: Oral cavity within normal limits. Patient is edentulous. Palatine tonsils symmetric and  within normal limits. Calcified tonsilliths noted. Remainder of the oropharynx and nasopharynx within normal limits. Retropharyngeal soft tissues within normal limits. Epiglottis normal. Vallecula clear. Remainder of the hypopharynx and supraglottic larynx normal. Subglottic airway clear. Salivary glands: Unremarkable. Thyroid: Unremarkable. Lymph nodes: No pathologically enlarged lymph nodes identified within the neck. Vascular: Aortic atherosclerosis with prominent carotid bifurcation calcifications. Carotid siphon calcifications noted as well. Limited intracranial: Unremarkable. Visualized orbits: Visualized globes and orbital soft tissues within normal limits. Mastoids and visualized paranasal sinuses: Chronic left sphenoid sinusitis. Mastoids and middle ear cavities are clear. Skeleton: Postoperative changes from prior laminectomies at C3 through C5, with posterior instrumentation at C3 through C6. Hardware well positioned without complication. Multilevel degenerative spondylolysis again noted Upper chest: Visualized upper mediastinum within normal limits. Severe centrilobular and bullous emphysema. Other: Irregular gas and fluid collection along the mid midline posterior incision measures 4.8 x 1.1 x 6.8 cm (AP by transverse by craniocaudad). This lies at the level of C3 through C7. Mildly thickened rim enhancement about this collection as compared to previous adjacent  paraspinous muscular edema slightly more prominent as compared to previous exam. IMPRESSION: 1. Persistent 4.8 x 1.1 x 6.8 cm gas and fluid containing paraspinal collection along the posterior surgical approach. Again, finding may reflect persistent postoperative seroma or possibly abscess. Percutaneous sampling recommended if concern for infection. 2. Status post C3 through C5 laminectomy with posterior instrumentation at C3 through C6. 3. Atherosclerosis with severe emphysema. Electronically Signed   By: Jeannine Boga M.D.   On: 02/02/2017 01:17    Lab Data:  CBC: Recent Labs  Lab 02/01/17 1926 02/02/17 0410  WBC 8.0 7.0  NEUTROABS 4.7  --   HGB 10.6* 10.0*  HCT 32.1* 30.8*  MCV 90.9 91.4  PLT 228 761   Basic Metabolic Panel: Recent Labs  Lab 02/01/17 1926 02/02/17 0410  NA 135 135  K 3.5 3.9  CL 103 104  CO2 25 26  GLUCOSE 157* 236*  BUN 6 9  CREATININE 0.78 0.87  CALCIUM 9.6 9.4   GFR: Estimated Creatinine Clearance: 67.1 mL/min (by C-G formula based on SCr of 0.87 mg/dL). Liver Function Tests: Recent Labs  Lab 02/01/17 1926  AST 18  ALT 24  ALKPHOS 66  BILITOT 0.5  PROT 6.9  ALBUMIN 2.6*   No results for input(s): LIPASE, AMYLASE in the last 168 hours. No results for input(s): AMMONIA in the last 168 hours. Coagulation Profile: No results for input(s): INR, PROTIME in the last 168 hours. Cardiac Enzymes: No results for input(s): CKTOTAL, CKMB, CKMBINDEX, TROPONINI in the last 168 hours. BNP (last 3 results) No results for input(s): PROBNP in the last 8760 hours. HbA1C: Recent Labs    02/01/17 1926  HGBA1C 12.3*   CBG: Recent Labs  Lab 02/03/17 0759 02/03/17 1259 02/03/17 1732 02/03/17 2239 02/04/17 0759  GLUCAP 134* 175* 213* 151* 170*   Lipid Profile: No results for input(s): CHOL, HDL, LDLCALC, TRIG, CHOLHDL, LDLDIRECT in the last 72 hours. Thyroid Function Tests: No results for input(s): TSH, T4TOTAL, FREET4, T3FREE, THYROIDAB in  the last 72 hours. Anemia Panel: No results for input(s): VITAMINB12, FOLATE, FERRITIN, TIBC, IRON, RETICCTPCT in the last 72 hours. Urine analysis:    Component Value Date/Time   COLORURINE STRAW (A) 11/29/2016 0933   APPEARANCEUR CLEAR 11/29/2016 0933   LABSPEC 1.023 11/29/2016 0933   PHURINE 8.0 11/29/2016 0933   GLUCOSEU >=500 (A) 11/29/2016 0933   HGBUR NEGATIVE 11/29/2016 0933   BILIRUBINUR NEGATIVE 11/29/2016 0933  KETONESUR 20 (A) 11/29/2016 0933   PROTEINUR NEGATIVE 11/29/2016 0933   NITRITE NEGATIVE 11/29/2016 0933   LEUKOCYTESUR NEGATIVE 11/29/2016 0933     Aniella Wandrey M.D. [facility] 02/04/2017, 11:08 AM  Pager: [phone] Between 7am to 7pm - call Pager - [phone]  After 7pm go to [email or web address] - password [email or web address]  Call night coverage person covering after 7pm

## 2017-02-04 NOTE — Progress Notes (Signed)
Subjective: No new complaints getting washed   Antibiotics:  Anti-infectives (From admission, onward)   Start     Dose/Rate Route Frequency Ordered Stop   02/04/17 1200  vancomycin (VANCOCIN) IVPB 1000 mg/200 mL premix     1,000 mg 200 mL/hr over 60 Minutes Intravenous Every 12 hours 02/03/17 2347     02/02/17 0800  vancomycin (VANCOCIN) 1,250 mg in sodium chloride 0.9 % 250 mL IVPB  Status:  Discontinued     1,250 mg 166.7 mL/hr over 90 Minutes Intravenous Every 12 hours 02/02/17 0519 02/03/17 2345   02/01/17 1900  vancomycin (VANCOCIN) 1,250 mg in sodium chloride 0.9 % 250 mL IVPB  Status:  Discontinued     1,250 mg 166.7 mL/hr over 90 Minutes Intravenous Every 12 hours 02/01/17 1846 02/02/17 0519      Medications: Scheduled Meds: . enoxaparin (LOVENOX) injection  40 mg Subcutaneous Q24H  . feeding supplement (GLUCERNA SHAKE)  237 mL Oral BID BM  . gabapentin  300 mg Oral BID  . Influenza vac split quadrivalent PF  0.5 mL Intramuscular Tomorrow-1000  . insulin aspart  0-15 Units Subcutaneous TID WC  . insulin aspart protamine- aspart  15 Units Subcutaneous BID WC  . lisinopril  40 mg Oral Daily  . nicotine  14 mg Transdermal Daily  . pantoprazole  40 mg Oral BID  . senna-docusate  1 tablet Oral BID   Continuous Infusions: . vancomycin Stopped (02/04/17 1446)   PRN Meds:.acetaminophen **OR** acetaminophen, hydrALAZINE, polyethylene glycol, traMADol    Objective: Weight change:   Intake/Output Summary (Last 24 hours) at 02/04/2017 2001 Last data filed at 02/04/2017 1638 Gross per 24 hour  Intake 200 ml  Output 1420 ml  Net -1220 ml   Blood pressure (!) 149/76, pulse 83, temperature 98.4 F (36.9 C), temperature source Oral, resp. rate 20, height 5\' 4"  (1.626 m), weight 145 lb 4.8 oz (65.9 kg), SpO2 100 %. Temp:  [98.2 F (36.8 C)-99.9 F (37.7 C)] 98.4 F (36.9 C) (11/18 1632) Pulse Rate:  [61-87] 83 (11/18 1632) Resp:  [20] 20 (11/18 1632) BP:  (136-167)/(65-76) 149/76 (11/18 1632) SpO2:  [98 %-100 %] 100 % (11/18 1632)  Physical Exam: General: Alert and awake, oriented x3, not in any acute distress. Being washed by aide Neuro: he does not have new neurological deficits  CBC:  CBC Latest Ref Rng & Units 02/02/2017 02/01/2017 12/01/2016  WBC 4.0 - 10.5 K/uL 7.0 8.0 5.5  Hemoglobin 13.0 - 17.0 g/dL 10.0(L) 10.6(L) 14.4  Hematocrit 39.0 - 52.0 % 30.8(L) 32.1(L) 41.2  Platelets 150 - 400 K/uL 226 228 149(L)      BMET Recent Labs    02/02/17 0410  NA 135  K 3.9  CL 104  CO2 26  GLUCOSE 236*  BUN 9  CREATININE 0.87  CALCIUM 9.4     Liver Panel  No results for input(s): PROT, ALBUMIN, AST, ALT, ALKPHOS, BILITOT, BILIDIR, IBILI in the last 72 hours.     Sedimentation Rate No results for input(s): ESRSEDRATE in the last 72 hours. C-Reactive Protein No results for input(s): CRP in the last 72 hours.  Micro Results: Recent Results (from the past 720 hour(s))  Culture, blood (Routine X 2) w Reflex to ID Panel     Status: None (Preliminary result)   Collection Time: 02/01/17  7:21 PM  Result Value Ref Range Status   Specimen Description BLOOD LEFT ARM  Final   Special Requests IN  PEDIATRIC BOTTLE Blood Culture adequate volume  Final   Culture NO GROWTH 3 DAYS  Final   Report Status PENDING  Incomplete  Culture, blood (Routine X 2) w Reflex to ID Panel     Status: None (Preliminary result)   Collection Time: 02/01/17  7:26 PM  Result Value Ref Range Status   Specimen Description BLOOD LEFT ARM  Final   Special Requests IN PEDIATRIC BOTTLE Blood Culture adequate volume  Final   Culture NO GROWTH 3 DAYS  Final   Report Status PENDING  Incomplete    Studies/Results: No results found.    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 male with MRSA bacteremia secondary to infected C spine surgical site where there is hardware. He has FAILED to clear his bacteremia       [place] [facility] Staphylococcus aureus bacteremia   Staphylococcus aureus bacteremia (SAB) is associated with a high rate of complications and mortality.  Specific aspects of clinical management are critical to optimizing the outcome of patients with SAB.  Therefore, the [facility] Antimicrobial Management Team [facility]) has initiated an intervention aimed at improving the management of SAB at [facility].  To do so, Infectious Diseases physicians are providing an evidence-based consult for the management of all patients with SAB.     Yes No Comments  Perform follow-up blood cultures (even if the patient is afebrile) to ensure clearance of bacteremia [x]  []  Blood CULTURES HERE ARE NO GROWTH X 3 days (THIS WAS AFTER HIS PICC WAS DC at the outside hospital I believe)  Remove vascular catheter and obtain follow-up blood cultures after the removal of the catheter []  []  DO NOT PLACE PICC OR ANY CENTRAL LINES  Perform echocardiography to evaluate for endocarditis (transthoracic ECHO is 40-50% sensitive, TEE is > 90% sensitive) []  []  Please keep in mind, that neither test can definitively EXCLUDE endocarditis, and that should clinical suspicion remain high for endocarditis the patient should then still be treated with an "endocarditis" duration of therapy = 6 weeks  TEE ON MONDAY  Consult electrophysiologist to evaluate implanted cardiac device (pacemaker, ICD) []  []    Ensure source control []  []  Have all abscesses been drained effectively? Have deep seeded infections (septic joints or osteomyelitis) had appropriate surgical debridement?  Neurosurgery have seen him and I have discussed case with Dr. Kathyrn Sheriff. He does not see  reason to do surgery at this point unless patient continues to be bacteremic    Investigate for "metastatic" sites of infection []  []  Does the patient have ANY symptom or physical exam finding that would suggest a deeper infection (back or neck pain that may be suggestive of vertebral osteomyelitis or epidural abscess, muscle pain that could be a symptom of pyomyositis)?  Keep in mind that for deep seeded infections MRI imaging with contrast is preferred rather than other often insensitive tests such as plain x-rays, especially early in a patient's presentation.  Change antibiotic therapy to VANCOMYCIN and would add RIFAMPIN ONCE we are confident blood cultures are NO GROWTH []  []  Beta-lactam antibiotics are preferred for MSSA due to higher cure rates.   If on Vancomycin,  goal trough should be 15 - 20 mcg/mL  Estimated duration of IV antibiotic therapy:  8 WEEKS []  []  Consult case management for probably prolonged outpatient IV antibiotic therapy    Dr. Baxter Flattery will take over the service tomorrow.    LOS: 3 days   Alcide Evener 02/04/2017, 8:01 PM

## 2017-02-05 ENCOUNTER — Encounter (HOSPITAL_COMMUNITY): Payer: Self-pay | Admitting: *Deleted

## 2017-02-05 ENCOUNTER — Encounter (HOSPITAL_COMMUNITY)
Admission: AD | Disposition: A | Discharge status: Skilled Nursing Facility | Payer: Self-pay | Source: Other Acute Inpatient Hospital | Attending: Internal Medicine

## 2017-02-05 ENCOUNTER — Other Ambulatory Visit: Payer: Self-pay

## 2017-02-05 ENCOUNTER — Inpatient Hospital Stay (HOSPITAL_COMMUNITY): Payer: Medicare HMO

## 2017-02-05 DIAGNOSIS — T847XXS Infection and inflammatory reaction due to other internal orthopedic prosthetic devices, implants and grafts, sequela: Secondary | ICD-10-CM

## 2017-02-05 DIAGNOSIS — R7881 Bacteremia: Secondary | ICD-10-CM

## 2017-02-05 HISTORY — PX: TEE WITHOUT CARDIOVERSION: SHX5443

## 2017-02-05 LAB — HEPATITIS C ANTIBODY (REFLEX): HCV Ab: <0.1 s/co ratio (ref 0.0–0.9)

## 2017-02-05 LAB — BASIC METABOLIC PANEL
Anion gap: 7 (ref 5–15)
BUN: 9 mg/dL (ref 6–20)
CO2: 24 mmol/L (ref 22–32)
CREATININE: 0.88 mg/dL (ref 0.61–1.24)
Calcium: 9.6 mg/dL (ref 8.9–10.3)
Chloride: 105 mmol/L (ref 101–111)
GFR calc non Af Amer: >60 mL/min (ref >60)
Glucose, Bld: 275 mg/dL — ABNORMAL HIGH (ref 65–99)
Potassium: 4.1 mmol/L (ref 3.5–5.1)
SODIUM: 136 mmol/L (ref 135–145)

## 2017-02-05 LAB — GLUCOSE, CAPILLARY
GLUCOSE-CAPILLARY: 215 mg/dL — AB (ref 65–99)
GLUCOSE-CAPILLARY: 167 mg/dL — AB (ref 65–99)
GLUCOSE-CAPILLARY: 304 mg/dL — AB (ref 65–99)
Glucose-Capillary: 155 mg/dL — ABNORMAL HIGH (ref 65–99)

## 2017-02-05 LAB — HEPATITIS B SURFACE ANTIGEN: HEP B S AG: NEGATIVE

## 2017-02-05 LAB — HCV COMMENT:

## 2017-02-05 SURGERY — ECHOCARDIOGRAM, TRANSESOPHAGEAL
Anesthesia: Moderate Sedation

## 2017-02-05 MED ORDER — MIDAZOLAM HCL 10 MG/2ML IJ SOLN
INTRAMUSCULAR | Status: DC | PRN
  Administered 2017-02-05: 2 mg via INTRAVENOUS

## 2017-02-05 MED ORDER — BUTAMBEN-TETRACAINE-BENZOCAINE 2-2-14 % EX AERO
INHALATION_SPRAY | CUTANEOUS | Status: DC | PRN
  Administered 2017-02-05: 2 via TOPICAL

## 2017-02-05 MED ORDER — SODIUM CHLORIDE 0.9 % IV SOLN
INTRAVENOUS | Status: DC
  Administered 2017-02-05: 12:00:00 via INTRAVENOUS

## 2017-02-05 MED ORDER — MIDAZOLAM HCL 5 MG/ML IJ SOLN
INTRAMUSCULAR | Status: AC
  Filled 2017-02-05: qty 2

## 2017-02-05 MED ORDER — FENTANYL CITRATE (PF) 100 MCG/2ML IJ SOLN
INTRAMUSCULAR | Status: AC
  Filled 2017-02-05: qty 2

## 2017-02-05 MED ORDER — RIFAMPIN 300 MG PO CAPS
300.0000 mg | ORAL_CAPSULE | Freq: Two times a day (BID) | ORAL | Status: DC
  Administered 2017-02-05 – 2017-02-08 (×7): 300 mg via ORAL
  Filled 2017-02-05 (×7): qty 1

## 2017-02-05 MED ORDER — FENTANYL CITRATE (PF) 100 MCG/2ML IJ SOLN
INTRAMUSCULAR | Status: DC | PRN
  Administered 2017-02-05: 25 ug via INTRAVENOUS

## 2017-02-05 MED ORDER — INSULIN ASPART PROT & ASPART (70-30 MIX) 100 UNIT/ML ~~LOC~~ SUSP
20.0000 [IU] | Freq: Two times a day (BID) | SUBCUTANEOUS | Status: DC
  Administered 2017-02-06 – 2017-02-08 (×5): 20 [IU] via SUBCUTANEOUS

## 2017-02-05 MED ORDER — INSULIN ASPART PROT & ASPART (70-30 MIX) 100 UNIT/ML ~~LOC~~ SUSP
18.0000 [IU] | Freq: Two times a day (BID) | SUBCUTANEOUS | Status: DC
  Administered 2017-02-05: 18 [IU] via SUBCUTANEOUS

## 2017-02-05 MED ORDER — DIPHENHYDRAMINE HCL 50 MG/ML IJ SOLN
INTRAMUSCULAR | Status: AC
  Filled 2017-02-05: qty 1

## 2017-02-05 NOTE — Interval H&P Note (Signed)
History and Physical Interval Note:  02/05/2017 11:51 AM  Adrian Cross  has presented today for surgery, with the diagnosis of bacteremia  The various methods of treatment have been discussed with the patient and family. After consideration of risks, benefits and other options for treatment, the patient has consented to  Procedure(s): TRANSESOPHAGEAL ECHOCARDIOGRAM (TEE) (N/A) as a surgical intervention .  The patient's history has been reviewed, patient examined, no change in status, stable for surgery.  I have reviewed the patient's chart and labs.  Questions were answered to the patient's satisfaction.     [employer]

## 2017-02-05 NOTE — CV Procedure (Signed)
   Transesophageal echocardiogram  Indication: Bacteremia MRSA  Timeout performed  During this procedure the patient is administered a total of Versed 2 mg and Fentanyl 25 mcg to achieve and maintain moderate conscious sedation.  The patient's heart rate, blood pressure, and oxygen saturation are monitored continuously during the procedure. The period of conscious sedation is 20 minutes, of which I was present face-to-face 100% of this time.  Findings  -There is a prominent highly mobile structure attached to the end of eustachian valve (fetal remnant at the attachment site of inferior vena cava to right atrium) in the right atrium, 1 x 0.8 cm at maximum thickness, that is concerning for possible vegetation.  This is a single strand, not characteristic of a Chiari network with multiple strands.  With his positive blood cultures, I would be concerned that vegetative matter has attached to his eustachian valve.  -Normal ejection fraction 55%  -Trace mitral regurgitation.  -There is no evidence of valvular vegetations, mitral, tricuspid, aortic and pulmonic all appear without vegetations.  Candee Furbish, MD

## 2017-02-05 NOTE — Telephone Encounter (Signed)
This encounter was created in error - please disregard.

## 2017-02-05 NOTE — Patient Outreach (Signed)
[facility]) Care Management  02/05/2017  Adrian Cross 02/03/1946 [id number]   Transition of care  Referral date:01/30/17 Referral source:discharged from an inpatient admission from [facility] on 01/25/17 Insurance:[employer] Attempt #2  Per chart review:  Patient was readmitted to the hospital on 02/01/17.    PLAN: RNCM referred patient to [facility] hospital liaisons to follow for discharge disposition.   Quinn Plowman RN,BSN,CCM [facility] Telephonic  [phone]

## 2017-02-05 NOTE — H&P (View-Only) (Signed)
[facility]                                                                              Patient Demographics  Adrian Cross, is a 71 y.o. male, DOB - [birth year], HKV:[id number]  Admit date - 02/01/2017   Admitting Physician Pratik Darleen Crocker, DO  Outpatient Primary MD for the patient is Iona Beard, MD  Outpatient specialists:   LOS - 4  days   Medical records reviewed and are as summarized below:    No chief complaint on file.      Brief summary   Patient is a 71 year old male with hypertension, diabetes type 2, recent laminectomy, fusion with hardware placement done at [facility], then presented to [facility] hospital with altered mental status and was found to have MRSA bacteremia.  Patient was found to have infection at the surgical site/cervical spine which was debrided by neurosurgery, per outside record hardware was not infected. Patient continued to have persistently blood cultures positive for MRSA and was transferred here for TEE, scheduled on 11/19.   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  Patient underwent I&D on 11/9 of the back abscess, cultures grew MRSA from the cervical wound. -Per outside report, hardware was not infected.  Blood cultures persistently grew MRSA. Patient had a PICC line which was removed PTA - Repeat CT scan showed persistent 4.8 x1.1 x6.8 cm gas and fluid filled paraspinal collection along the posterior surgical site -Blood cultures 11/15 so far negative, TEE planned today 11/19 -Seen by neurosurgery, recommended continue antibiotics, follow blood cultures and TEE.  Currently second wound washout is not indicated unless patient develops progressive neurological decline from the mass-effect.  In addition, recommended should be done by his primary neurosurgeon at [facility], Dr. Carloyn Manner   Hypertension BP stable, continue lisinopril, hydralazine as needed  Diabetes type 2 with neuropathy -Poorly controlled A1c 12.3, -Increase NovoLog 70/30  18 units twice a day  Tobacco use -Patient recommended to quit smoking, continue nicotine patch   Code Status: Full CODE STATUS DVT Prophylaxis:  Lovenox Family Communication: Discussed in detail with the patient, all imaging results, lab results explained to the patient   Disposition Plan: When cleared by ID  Time Spent in minutes  15 minutes  Procedures:    Consultants:   IR Infectious disease Neurosurgery  Antimicrobials:   IV vancomycin   Medications  Scheduled Meds: . enoxaparin (LOVENOX) injection  40 mg Subcutaneous Q24H  . feeding supplement (GLUCERNA SHAKE)  237 mL Oral BID BM  . gabapentin  300 mg Oral BID  . Influenza vac split quadrivalent PF  0.5 mL Intramuscular Tomorrow-1000  . insulin aspart  0-15 Units Subcutaneous TID WC  . insulin aspart protamine- aspart  15 Units Subcutaneous BID WC  . lisinopril  40 mg Oral Daily  . nicotine  14 mg Transdermal Daily  . pantoprazole  40 mg Oral BID  . rifampin  300 mg Oral Q12H  . senna-docusate  1 tablet Oral BID   Continuous Infusions: . vancomycin Stopped (02/05/17 0035)   PRN Meds:.acetaminophen **OR** acetaminophen, hydrALAZINE, polyethylene glycol, traMADol   Antibiotics   Anti-infectives (From admission, onward)   Start     Dose/Rate Route Frequency Ordered Stop   02/05/17 1000  rifampin (RIFADIN) capsule 300 mg     300 mg Oral Every 12 hours 02/05/17 0946     02/04/17 1200  vancomycin (VANCOCIN) IVPB 1000 mg/200 mL premix     1,000 mg 200 mL/hr over 60 Minutes Intravenous Every 12 hours 02/03/17 2347     02/02/17 0800  vancomycin (VANCOCIN) 1,250 mg in sodium chloride 0.9 % 250 mL IVPB  Status:  Discontinued     1,250 mg 166.7 mL/hr over 90 Minutes Intravenous Every 12 hours 02/02/17 0519 02/03/17 2345   02/01/17  1900  vancomycin (VANCOCIN) 1,250 mg in sodium chloride 0.9 % 250 mL IVPB  Status:  Discontinued     1,250 mg 166.7 mL/hr over 90 Minutes Intravenous Every 12 hours 02/01/17 1846 02/02/17 0519        Subjective:   Adrian Cross was seen and examined today.  No new complaints, awaiting TEE today.  Small BM yesterday.  No fevers.  Back pain controlled.  Patient denies dizziness, chest pain, shortness of breath, abdominal pain, N/V/D/C.  Objective:   Vitals:   02/04/17 1632 02/04/17 2137 02/05/17 0458 02/05/17 1033  BP: (!) 149/76 129/65 124/78 128/68  Pulse: 83 83 80   Resp: 20 17 16    Temp: 98.4 F (36.9 C) 99.8 F (37.7 C) 98.6 F (37 C)   TempSrc: Oral Oral Oral   SpO2: 100% 100% 99%   Weight:      Height:        Intake/Output Summary (Last 24 hours) at 02/05/2017 1046 Last data filed at 02/05/2017 0700 Gross per 24 hour  Intake 200 ml  Output 1900 ml  Net -1700 ml     Wt Readings from Last 3 Encounters:  02/01/17 65.9 kg (145 lb 4.8 oz)  11/29/16 77.8 kg (171 lb 9.6 oz)  10/30/13 70.6 kg (155 lb 10.3 oz)     Exam   General: Alert and oriented x 3, NAD  Eyes:   HEENT:    Cardiovascular: S1 S2 clear, RRR. No pedal edema b/l  Respiratory: CTAB  Gastrointestinal: Soft, nontender, nondistended, + bowel sounds  Ext: no pedal edema bilaterally  Neuro: no new deficits  Musculoskeletal: No digital cyanosis, clubbing  Skin: No rashes  Psych: Normal affect and demeanor, alert and oriented x3    Data Reviewed:  I have personally reviewed following labs and imaging studies  Micro Results Recent Results (from the past 240 hour(s))  Culture, blood (Routine X 2) w Reflex to ID Panel     Status: None (Preliminary result)   Collection Time: 02/01/17  7:21 PM  Result Value Ref Range Status   Specimen Description BLOOD LEFT ARM  Final   Special Requests IN PEDIATRIC BOTTLE Blood Culture adequate volume  Final   Culture NO GROWTH 3 DAYS  Final   Report  Status PENDING  Incomplete  Culture, blood (Routine X 2) w Reflex to ID Panel     Status: None (Preliminary result)   Collection Time: 02/01/17  7:26 PM  Result Value Ref Range Status   Specimen Description BLOOD LEFT ARM  Final   Special Requests IN PEDIATRIC BOTTLE Blood Culture adequate volume  Final   Culture NO GROWTH 3 DAYS  Final   Report Status PENDING  Incomplete    Radiology Reports Ct Soft Tissue Neck W Contrast  Result Date: 02/02/2017 CLINICAL DATA:  Initial evaluation for bacteremia, back pain, infection of the neck at site of incision. EXAM: CT NECK WITH CONTRAST TECHNIQUE: Multidetector CT imaging of the neck was performed using the standard protocol following the bolus administration of intravenous contrast. CONTRAST:  <See Chart> ISOVUE-300 IOPAMIDOL (ISOVUE-300) INJECTION 61% COMPARISON:  Prior CT from 01/23/2017. FINDINGS: Pharynx and larynx: Oral cavity within normal limits. Patient is edentulous. Palatine tonsils symmetric and within normal limits. Calcified tonsilliths noted. Remainder of the oropharynx and nasopharynx within normal limits. Retropharyngeal soft tissues within normal limits. Epiglottis normal. Vallecula clear. Remainder of the hypopharynx and supraglottic larynx normal. Subglottic airway clear. Salivary glands: Unremarkable. Thyroid: Unremarkable. Lymph nodes: No pathologically enlarged lymph nodes identified within the neck. Vascular: Aortic atherosclerosis with prominent carotid bifurcation calcifications. Carotid siphon calcifications noted as well. Limited intracranial: Unremarkable. Visualized orbits: Visualized globes and orbital soft tissues within normal limits. Mastoids and visualized paranasal sinuses: Chronic left sphenoid sinusitis. Mastoids and middle ear cavities are clear. Skeleton: Postoperative changes from prior laminectomies at C3 through C5, with posterior instrumentation at C3 through C6. Hardware well positioned without complication.  Multilevel degenerative spondylolysis again noted Upper chest: Visualized upper mediastinum within normal limits. Severe centrilobular and bullous emphysema. Other: Irregular gas and fluid collection along the mid midline posterior incision measures 4.8 x 1.1 x 6.8 cm (AP by transverse by craniocaudad). This lies at the level of C3 through C7. Mildly thickened rim enhancement about this collection as compared to previous adjacent paraspinous muscular edema slightly more prominent as compared to previous exam. IMPRESSION: 1. Persistent 4.8 x 1.1 x 6.8 cm gas and fluid containing paraspinal collection along the posterior surgical approach. Again, finding may reflect persistent postoperative seroma or possibly abscess. Percutaneous sampling recommended if concern for infection. 2. Status post C3 through C5 laminectomy with posterior instrumentation at C3 through C6. 3. Atherosclerosis with severe emphysema. Electronically Signed   By: Jeannine Boga M.D.   On: 02/02/2017 01:17    Lab Data:  CBC: Recent Labs  Lab 02/01/17 1926 02/02/17 0410  WBC 8.0 7.0  NEUTROABS 4.7  --   HGB 10.6* 10.0*  HCT 32.1* 30.8*  MCV 90.9 91.4  PLT 228 846   Basic Metabolic Panel: Recent Labs  Lab 02/01/17 1926 02/02/17 0410 02/05/17 0210  NA 135 135 136  K 3.5 3.9 4.1  CL 103 104 105  CO2 25 26 24   GLUCOSE 157* 236* 275*  BUN 6 9 9   CREATININE 0.78 0.87 0.88  CALCIUM 9.6 9.4 9.6   GFR: Estimated Creatinine Clearance: 66.3 mL/min (by C-G formula based on SCr of 0.88 mg/dL). Liver Function Tests: Recent Labs  Lab 02/01/17 1926  AST 18  ALT 24  ALKPHOS 66  BILITOT 0.5  PROT 6.9  ALBUMIN 2.6*   No results for input(s): LIPASE, AMYLASE in the last 168 hours. No results for input(s): AMMONIA in the last 168 hours. Coagulation Profile: No results for input(s): INR, PROTIME in the last 168 hours. Cardiac Enzymes: No results for input(s): CKTOTAL, CKMB, CKMBINDEX, TROPONINI in the last 168  hours. BNP (last 3 results) No results for input(s): PROBNP in the last 8760 hours. HbA1C: No results for input(s): HGBA1C in the last 72 hours. CBG: Recent Labs  Lab 02/04/17 0759 02/04/17 1217 02/04/17 1704 02/04/17 2136 02/05/17 0808  GLUCAP 170* 140* 231* 162* 215*  Lipid Profile: No results for input(s): CHOL, HDL, LDLCALC, TRIG, CHOLHDL, LDLDIRECT in the last 72 hours. Thyroid Function Tests: No results for input(s): TSH, T4TOTAL, FREET4, T3FREE, THYROIDAB in the last 72 hours. Anemia Panel: No results for input(s): VITAMINB12, FOLATE, FERRITIN, TIBC, IRON, RETICCTPCT in the last 72 hours. Urine analysis:    Component Value Date/Time   COLORURINE STRAW (A) 11/29/2016 0933   APPEARANCEUR CLEAR 11/29/2016 0933   LABSPEC 1.023 11/29/2016 0933   PHURINE 8.0 11/29/2016 0933   GLUCOSEU >=500 (A) 11/29/2016 0933   HGBUR NEGATIVE 11/29/2016 0933   BILIRUBINUR NEGATIVE 11/29/2016 0933   KETONESUR 20 (A) 11/29/2016 0933   PROTEINUR NEGATIVE 11/29/2016 0933   NITRITE NEGATIVE 11/29/2016 0933   LEUKOCYTESUR NEGATIVE 11/29/2016 0933     Ripudeep Rai M.D. [facility] 02/05/2017, 10:46 AM  Pager: [phone] Between 7am to 7pm - call Pager - [phone]  After 7pm go to [email or web address] - password [email or web address]  Call night coverage person covering after 7pm

## 2017-02-05 NOTE — Progress Notes (Signed)
    Adrian Cross for Infectious Disease  Reason for visit: Follow up on persistent MRSA bacteremia  ID History: Developed surgical site infection & MRSA bacteremia following spinal fusion performed C3-C6 1 month ago. I&D 11/9 with wound cultures growing MRSA. Blood cultures persistently grew MRSA. PICC removed and transferred to [facility] for TEE. Repeat CT with persistent 4.8 x 1.1 x 6.8 cm back abscess. Remains on Vancomycin. Bcx 11/15 NGTD.   Interval History, Subjective: Patient feels well, no complaint. Afebrile. Seen by Dr. Kathyrn Sheriff of neurosurg who does not believe repeat wash-out or surgical revision is indicated.  To have TEE today.  Bcx 11/15 NGTD.  Physical Exam: Constitutional:  Vitals:   02/04/17 2137 02/05/17 0458  BP: 129/65 124/78  Pulse: 83 80  Resp: 17 16  Temp: 99.8 F (37.7 C) 98.6 F (37 C)  SpO2: 100% 99%   patient appears in NAD Respiratory: Normal respiratory effort; CTA B Cardiovascular: RRR GI: soft, nt, nd  Review of Systems: No complaints. Hungry.   Lab Results  Component Value Date   WBC 7.0 02/02/2017   HGB 10.0 (L) 02/02/2017   HCT 30.8 (L) 02/02/2017   MCV 91.4 02/02/2017   PLT 226 02/02/2017    Lab Results  Component Value Date   CREATININE 0.88 02/05/2017   BUN 9 02/05/2017   NA 136 02/05/2017   K 4.1 02/05/2017   CL 105 02/05/2017   CO2 24 02/05/2017    Lab Results  Component Value Date   ALT 24 02/01/2017   AST 18 02/01/2017   ALKPHOS 66 02/01/2017     Microbiology: Recent Results (from the past 240 hour(s))  Culture, blood (Routine X 2) w Reflex to ID Panel     Status: None (Preliminary result)   Collection Time: 02/01/17  7:21 PM  Result Value Ref Range Status   Specimen Description BLOOD LEFT ARM  Final   Special Requests IN PEDIATRIC BOTTLE Blood Culture adequate volume  Final   Culture NO GROWTH 3 DAYS  Final   Report Status PENDING  Incomplete  Culture, blood (Routine X 2) w Reflex to ID Panel     Status: None  (Preliminary result)   Collection Time: 02/01/17  7:26 PM  Result Value Ref Range Status   Specimen Description BLOOD LEFT ARM  Final   Special Requests IN PEDIATRIC BOTTLE Blood Culture adequate volume  Final   Culture NO GROWTH 3 DAYS  Final   Report Status PENDING  Incomplete    Impression/Plan:  1. Hx of persistent MRSA bacteremia, source felt to be neck/back abscess: IV Vancomycin 11/17 >> current. Bcx 11/15 NGTD. Neurosurg evaluated patient and agree with continued IV antibiotics and TEE. No second wound washout indicated unless patient were to develop neurological decline/mass-effect. If this were to be indicated, would need to be with his primary neurosurg at [facility], Dr. Carloyn Manner.  A. Will have TEE today to assess for endocarditis.   B. Estimate patient will need 6-8 weeks IV Vancomycin. Will likely need SNF at dc given his functional decline due to prolonged hospitalization.

## 2017-02-05 NOTE — Progress Notes (Signed)
Physical Therapy Treatment Patient Details Name: Adrian Cross MRN: [id number] DOB: 08/01/1945 Today's Date: 02/05/2017    History of Present Illness 71 year old male with hypertension, diabetes type 2, recent laminectomy, fusion with hardware placement done at [facility], then presented to [facility] hospital with altered mental status and was found to have MRSA bacteremia.  Patient was found to have infection at the surgical site/cervical spine which was debrided by neurosurgery, per outside record hardware was not infected. Patient continued to have persistently blood cultures positive for MRSA and was transferred here for TEE, scheduled on 11/19.    PT Comments    Pt ambulated in hall with 1 LOB. Pt admits that he needs to work on his balance. Pt currently reliant on UE support to maintain balance during ambulation. He would benefit from continued skilled PT to increase safety with mobility and functional independence. Will continue to follow acutely.    Follow Up Recommendations  SNF;Supervision/Assistance - 24 hour(return to rehabilitation center to finish therapies)     Equipment Recommendations  None recommended by PT    Recommendations for Other Services       Precautions / Restrictions Precautions Precautions: Fall Restrictions Weight Bearing Restrictions: No    Mobility  Bed Mobility               General bed mobility comments: in chair on arrival  Transfers Overall transfer level: Needs assistance Equipment used: Rolling walker (2 wheeled) Transfers: Sit to/from Stand Sit to Stand: Min guard         General transfer comment: min guard for safety. Pt with good technique to stand.  Ambulation/Gait Ambulation/Gait assistance: Min guard Ambulation Distance (Feet): 100 Feet Assistive device: Rolling walker (2 wheeled) Gait Pattern/deviations: Step-through pattern;Decreased stride length;Trunk flexed;Narrow base of support Gait velocity:  decreased Gait velocity interpretation: Below normal speed for age/gender General Gait Details: Pt with slow gait. 1 LOB, pt able to correct w/o assist.  Curing for technique and to keep walker on the ground when turning.   Stairs            Wheelchair Mobility    Modified Rankin (Stroke Patients Only)       Balance Overall balance assessment: Needs assistance   Sitting balance-Leahy Scale: Fair     Standing balance support: During functional activity Standing balance-Leahy Scale: Poor Standing balance comment: reliance on UE support                            Cognition Arousal/Alertness: Awake/alert Behavior During Therapy: WFL for tasks assessed/performed Overall Cognitive Status: Within Functional Limits for tasks assessed                                        Exercises      General Comments        Pertinent Vitals/Pain Pain Assessment: Faces Faces Pain Scale: Hurts a little bit Pain Location: neck Pain Descriptors / Indicators: Sore;Discomfort Pain Intervention(s): Monitored during session    Home Living                      Prior Function            PT Goals (current goals can now be found in the care plan section) Acute Rehab PT Goals Patient Stated Goal: to get back home PT  Goal Formulation: With patient Time For Goal Achievement: 02/16/17 Potential to Achieve Goals: Good Progress towards PT goals: Progressing toward goals    Frequency    Min 3X/week      PT Plan Current plan remains appropriate    Co-evaluation              AM-PAC PT "6 Clicks" Daily Activity  Outcome Measure  Difficulty turning over in bed (including adjusting bedclothes, sheets and blankets)?: A Little Difficulty moving from lying on back to sitting on the side of the bed? : Unable Difficulty sitting down on and standing up from a chair with arms (e.g., wheelchair, bedside commode, etc,.)?: Unable Help needed moving  to and from a bed to chair (including a wheelchair)?: A Little Help needed walking in hospital room?: A Little Help needed climbing 3-5 steps with a railing? : A Lot 6 Click Score: 13    End of Session Equipment Utilized During Treatment: Gait belt Activity Tolerance: Patient tolerated treatment well Patient left: in chair;with call bell/phone within reach Nurse Communication: Mobility status PT Visit Diagnosis: Unsteadiness on feet (R26.81);Difficulty in walking, not elsewhere classified (R26.2)     Time: 4163-8453 PT Time Calculation (min) (ACUTE ONLY): 18 min  Charges:  $Gait Training: 8-22 mins                    G Codes:       Benjiman Core, [place] Pager [phone] Acute Rehab  Allena Katz 02/05/2017, 3:56 PM

## 2017-02-05 NOTE — Progress Notes (Signed)
  Echocardiogram Echocardiogram Transesophageal has been performed.  Adrian Cross Adrian Cross 02/05/2017, 12:42 PM

## 2017-02-05 NOTE — Progress Notes (Signed)
[facility]                                                                              Patient Demographics  Adrian Cross, is a 71 y.o. male, DOB - 10/20/1945, GEZ:[id number]  Admit date - 02/01/2017   Admitting Physician Pratik Darleen Crocker, DO  Outpatient Primary MD for the patient is Iona Beard, MD  Outpatient specialists:   LOS - 4  days   Medical records reviewed and are as summarized below:    No chief complaint on file.      Brief summary   Patient is a 71 year old male with hypertension, diabetes type 2, recent laminectomy, fusion with hardware placement done at [facility], then presented to [facility] hospital with altered mental status and was found to have MRSA bacteremia.  Patient was found to have infection at the surgical site/cervical spine which was debrided by neurosurgery, per outside record hardware was not infected. Patient continued to have persistently blood cultures positive for MRSA and was transferred here for TEE, scheduled on 11/19.   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  Patient underwent I&D on 11/9 of the back abscess, cultures grew MRSA from the cervical wound. -Per outside report, hardware was not infected.  Blood cultures persistently grew MRSA. Patient had a PICC line which was removed PTA - Repeat CT scan showed persistent 4.8 x1.1 x6.8 cm gas and fluid filled paraspinal collection along the posterior surgical site -Blood cultures 11/15 so far negative, TEE planned today 11/19 -Seen by neurosurgery, recommended continue antibiotics, follow blood cultures and TEE.  Currently second wound washout is not indicated unless patient develops progressive neurological decline from the mass-effect.  In addition, recommended should be done by his primary neurosurgeon [facility], Dr. Carloyn Manner   Hypertension BP stable, continue lisinopril, hydralazine as needed  Diabetes type 2 with neuropathy -Poorly controlled A1c 12.3, -Increase NovoLog 70/30  18 units twice a day  Tobacco use -Patient recommended to quit smoking, continue nicotine patch   Code Status: Full CODE STATUS DVT Prophylaxis:  Lovenox Family Communication: Discussed in detail with the patient, all imaging results, lab results explained to the patient   Disposition Plan: When cleared by ID  Time Spent in minutes  15 minutes  Procedures:    Consultants:   IR Infectious disease Neurosurgery  Antimicrobials:   IV vancomycin   Medications  Scheduled Meds: . enoxaparin (LOVENOX) injection  40 mg Subcutaneous Q24H  . feeding supplement (GLUCERNA SHAKE)  237 mL Oral BID BM  . gabapentin  300 mg Oral BID  . Influenza vac split quadrivalent PF  0.5 mL Intramuscular Tomorrow-1000  . insulin aspart  0-15 Units Subcutaneous TID WC  . insulin aspart protamine- aspart  15 Units Subcutaneous BID WC  . lisinopril  40 mg Oral Daily  . nicotine  14 mg Transdermal Daily  . pantoprazole  40 mg Oral BID  . rifampin  300 mg Oral Q12H  . senna-docusate  1 tablet Oral BID   Continuous Infusions: . vancomycin Stopped (02/05/17 0035)   PRN Meds:.acetaminophen **OR** acetaminophen, hydrALAZINE, polyethylene glycol, traMADol   Antibiotics   Anti-infectives (From admission, onward)   Start     Dose/Rate Route Frequency Ordered Stop   02/05/17 1000  rifampin (RIFADIN) capsule 300 mg     300 mg Oral Every 12 hours 02/05/17 0946     02/04/17 1200  vancomycin (VANCOCIN) IVPB 1000 mg/200 mL premix     1,000 mg 200 mL/hr over 60 Minutes Intravenous Every 12 hours 02/03/17 2347     02/02/17 0800  vancomycin (VANCOCIN) 1,250 mg in sodium chloride 0.9 % 250 mL IVPB  Status:  Discontinued     1,250 mg 166.7 mL/hr over 90 Minutes Intravenous Every 12 hours 02/02/17 0519 02/03/17 2345   02/01/17  1900  vancomycin (VANCOCIN) 1,250 mg in sodium chloride 0.9 % 250 mL IVPB  Status:  Discontinued     1,250 mg 166.7 mL/hr over 90 Minutes Intravenous Every 12 hours 02/01/17 1846 02/02/17 0519        Subjective:   Adrian Cross was seen and examined today.  No new complaints, awaiting TEE today.  Small BM yesterday.  No fevers.  Back pain controlled.  Patient denies dizziness, chest pain, shortness of breath, abdominal pain, N/V/D/C.  Objective:   Vitals:   02/04/17 1632 02/04/17 2137 02/05/17 0458 02/05/17 1033  BP: (!) 149/76 129/65 124/78 128/68  Pulse: 83 83 80   Resp: 20 17 16    Temp: 98.4 F (36.9 C) 99.8 F (37.7 C) 98.6 F (37 C)   TempSrc: Oral Oral Oral   SpO2: 100% 100% 99%   Weight:      Height:        Intake/Output Summary (Last 24 hours) at 02/05/2017 1046 Last data filed at 02/05/2017 0700 Gross per 24 hour  Intake 200 ml  Output 1900 ml  Net -1700 ml     Wt Readings from Last 3 Encounters:  02/01/17 65.9 kg (145 lb 4.8 oz)  11/29/16 77.8 kg (171 lb 9.6 oz)  10/30/13 70.6 kg (155 lb 10.3 oz)     Exam   General: Alert and oriented x 3, NAD  Eyes:   HEENT:    Cardiovascular: S1 S2 clear, RRR. No pedal edema b/l  Respiratory: CTAB  Gastrointestinal: Soft, nontender, nondistended, + bowel sounds  Ext: no pedal edema bilaterally  Neuro: no new deficits  Musculoskeletal: No digital cyanosis, clubbing  Skin: No rashes  Psych: Normal affect and demeanor, alert and oriented x3    Data Reviewed:  I have personally reviewed following labs and imaging studies  Micro Results Recent Results (from the past 240 hour(s))  Culture, blood (Routine X 2) w Reflex to ID Panel     Status: None (Preliminary result)   Collection Time: 02/01/17  7:21 PM  Result Value Ref Range Status   Specimen Description BLOOD LEFT ARM  Final   Special Requests IN PEDIATRIC BOTTLE Blood Culture adequate volume  Final   Culture NO GROWTH 3 DAYS  Final   Report  Status PENDING  Incomplete  Culture, blood (Routine X 2) w Reflex to ID Panel     Status: None (Preliminary result)   Collection Time: 02/01/17  7:26 PM  Result Value Ref Range Status   Specimen Description BLOOD LEFT ARM  Final   Special Requests IN PEDIATRIC BOTTLE Blood Culture adequate volume  Final   Culture NO GROWTH 3 DAYS  Final   Report Status PENDING  Incomplete    Radiology Reports Ct Soft Tissue Neck W Contrast  Result Date: 02/02/2017 CLINICAL DATA:  Initial evaluation for bacteremia, back pain, infection of the neck at site of incision. EXAM: CT NECK WITH CONTRAST TECHNIQUE: Multidetector CT imaging of the neck was performed using the standard protocol following the bolus administration of intravenous contrast. CONTRAST:  <See Chart> ISOVUE-300 IOPAMIDOL (ISOVUE-300) INJECTION 61% COMPARISON:  Prior CT from 01/23/2017. FINDINGS: Pharynx and larynx: Oral cavity within normal limits. Patient is edentulous. Palatine tonsils symmetric and within normal limits. Calcified tonsilliths noted. Remainder of the oropharynx and nasopharynx within normal limits. Retropharyngeal soft tissues within normal limits. Epiglottis normal. Vallecula clear. Remainder of the hypopharynx and supraglottic larynx normal. Subglottic airway clear. Salivary glands: Unremarkable. Thyroid: Unremarkable. Lymph nodes: No pathologically enlarged lymph nodes identified within the neck. Vascular: Aortic atherosclerosis with prominent carotid bifurcation calcifications. Carotid siphon calcifications noted as well. Limited intracranial: Unremarkable. Visualized orbits: Visualized globes and orbital soft tissues within normal limits. Mastoids and visualized paranasal sinuses: Chronic left sphenoid sinusitis. Mastoids and middle ear cavities are clear. Skeleton: Postoperative changes from prior laminectomies at C3 through C5, with posterior instrumentation at C3 through C6. Hardware well positioned without complication.  Multilevel degenerative spondylolysis again noted Upper chest: Visualized upper mediastinum within normal limits. Severe centrilobular and bullous emphysema. Other: Irregular gas and fluid collection along the mid midline posterior incision measures 4.8 x 1.1 x 6.8 cm (AP by transverse by craniocaudad). This lies at the level of C3 through C7. Mildly thickened rim enhancement about this collection as compared to previous adjacent paraspinous muscular edema slightly more prominent as compared to previous exam. IMPRESSION: 1. Persistent 4.8 x 1.1 x 6.8 cm gas and fluid containing paraspinal collection along the posterior surgical approach. Again, finding may reflect persistent postoperative seroma or possibly abscess. Percutaneous sampling recommended if concern for infection. 2. Status post C3 through C5 laminectomy with posterior instrumentation at C3 through C6. 3. Atherosclerosis with severe emphysema. Electronically Signed   By: Jeannine Boga M.D.   On: 02/02/2017 01:17    Lab Data:  CBC: Recent Labs  Lab 02/01/17 1926 02/02/17 0410  WBC 8.0 7.0  NEUTROABS 4.7  --   HGB 10.6* 10.0*  HCT 32.1* 30.8*  MCV 90.9 91.4  PLT 228 062   Basic Metabolic Panel: Recent Labs  Lab 02/01/17 1926 02/02/17 0410 02/05/17 0210  NA 135 135 136  K 3.5 3.9 4.1  CL 103 104 105  CO2 25 26 24   GLUCOSE 157* 236* 275*  BUN 6 9 9   CREATININE 0.78 0.87 0.88  CALCIUM 9.6 9.4 9.6   GFR: Estimated Creatinine Clearance: 66.3 mL/min (by C-G formula based on SCr of 0.88 mg/dL). Liver Function Tests: Recent Labs  Lab 02/01/17 1926  AST 18  ALT 24  ALKPHOS 66  BILITOT 0.5  PROT 6.9  ALBUMIN 2.6*   No results for input(s): LIPASE, AMYLASE in the last 168 hours. No results for input(s): AMMONIA in the last 168 hours. Coagulation Profile: No results for input(s): INR, PROTIME in the last 168 hours. Cardiac Enzymes: No results for input(s): CKTOTAL, CKMB, CKMBINDEX, TROPONINI in the last 168  hours. BNP (last 3 results) No results for input(s): PROBNP in the last 8760 hours. HbA1C: No results for input(s): HGBA1C in the last 72 hours. CBG: Recent Labs  Lab 02/04/17 0759 02/04/17 1217 02/04/17 1704 02/04/17 2136 02/05/17 0808  GLUCAP 170* 140* 231* 162* 215*  Lipid Profile: No results for input(s): CHOL, HDL, LDLCALC, TRIG, CHOLHDL, LDLDIRECT in the last 72 hours. Thyroid Function Tests: No results for input(s): TSH, T4TOTAL, FREET4, T3FREE, THYROIDAB in the last 72 hours. Anemia Panel: No results for input(s): VITAMINB12, FOLATE, FERRITIN, TIBC, IRON, RETICCTPCT in the last 72 hours. Urine analysis:    Component Value Date/Time   COLORURINE STRAW (A) 11/29/2016 0933   APPEARANCEUR CLEAR 11/29/2016 0933   LABSPEC 1.023 11/29/2016 0933   PHURINE 8.0 11/29/2016 0933   GLUCOSEU >=500 (A) 11/29/2016 0933   HGBUR NEGATIVE 11/29/2016 0933   BILIRUBINUR NEGATIVE 11/29/2016 0933   KETONESUR 20 (A) 11/29/2016 0933   PROTEINUR NEGATIVE 11/29/2016 0933   NITRITE NEGATIVE 11/29/2016 0933   LEUKOCYTESUR NEGATIVE 11/29/2016 0933     Price Lachapelle M.D. [facility] 02/05/2017, 10:46 AM  Pager: [phone] Between 7am to 7pm - call Pager - [phone]  After 7pm go to [email or web address] - password [email or web address]  Call night coverage person covering after 7pm

## 2017-02-05 NOTE — Clinical Social Work Note (Signed)
Clinical Social Work Assessment  Patient Details  Name: Adrian Cross MRN: [id number] Date of Birth: October 11, 1945  Date of referral:  02/05/17               Reason for consult:  Facility Placement                Permission sought to share information with:  Facility Sport and exercise psychologist, Family Supports Permission granted to share information::  Yes, Verbal Permission Granted  Name::     [name]  Agency::  [employer]  Relationship::  Daughter  Contact Information:     Housing/Transportation Living arrangements for the past 2 months:  [place], [place] of Information:  Patient, Adult Children Patient Interpreter Needed:  None Criminal Activity/Legal Involvement Pertinent to Current Situation/Hospitalization:  No - Comment as needed Significant Relationships:  Adult Children Lives with:  Adult Children Do you feel safe going back to the place where you live?  Yes Need for family participation in patient care:  Yes (Comment)  Care giving concerns:  CSW received consult for possible SNF placement at time of discharge. CSW spoke with patient regarding PT recommendation of SNF placement at time of discharge. CSW explained that patient would be in his copay days with [facility] if he chose to return to [facility]. Patient reported understanding and asked CSW to contact his daughter. He reported that he would probably be going to her house. CSW to continue to follow and assist with discharge planning needs.   Social Worker assessment / plan:  CSW spoke with patient and his daughter concerning possibility of rehab at [facility] before returning home.  Employment status:  Retired Nurse, adult PT Recommendations:  [place] / Referral to community resources:  [place]  Patient/Family's Response to care:  Patient's daughter stated that she would be able to tell the hospital patient's  discharge plan once she had 24 hours notice of discharge. She reported understanding that there is a copay cost for patient to return to snf.   Patient/Family's Understanding of and Emotional Response to Diagnosis, Current Treatment, and Prognosis:  Patient/family is realistic regarding therapy needs and expressed being hopeful for SNF placement. Patient expressed understanding of CSW role and discharge process as well as medical condition. No questions/concerns about plan or treatment.    Emotional Assessment Appearance:  Appears stated age Attitude/Demeanor/Rapport:  Other(Appropriate) Affect (typically observed):  Accepting, Appropriate Orientation:  Oriented to Self, Oriented to Situation, Oriented to Place, Oriented to  Time Alcohol / Substance use:  Not Applicable Psych involvement (Current and /or in the community):  No (Comment)  Discharge Needs  Concerns to be addressed:  Care Coordination Readmission within the last 30 days:  No Current discharge risk:  None Barriers to Discharge:  Continued Medical Work up   [employer], [place] 02/05/2017, 3:38 PM

## 2017-02-06 ENCOUNTER — Encounter: Payer: Self-pay | Admitting: *Deleted

## 2017-02-06 ENCOUNTER — Encounter (HOSPITAL_COMMUNITY): Payer: Self-pay | Admitting: Cardiology

## 2017-02-06 LAB — BASIC METABOLIC PANEL
Anion gap: 7 (ref 5–15)
BUN: 11 mg/dL (ref 6–20)
CALCIUM: 9.6 mg/dL (ref 8.9–10.3)
CO2: 27 mmol/L (ref 22–32)
CREATININE: 0.94 mg/dL (ref 0.61–1.24)
Chloride: 103 mmol/L (ref 101–111)
Glucose, Bld: 202 mg/dL — ABNORMAL HIGH (ref 65–99)
Potassium: 3.8 mmol/L (ref 3.5–5.1)
SODIUM: 137 mmol/L (ref 135–145)

## 2017-02-06 LAB — GLUCOSE, CAPILLARY
GLUCOSE-CAPILLARY: 186 mg/dL — AB (ref 65–99)
GLUCOSE-CAPILLARY: 199 mg/dL — AB (ref 65–99)
GLUCOSE-CAPILLARY: 159 mg/dL — AB (ref 65–99)
Glucose-Capillary: 193 mg/dL — ABNORMAL HIGH (ref 65–99)

## 2017-02-06 LAB — CULTURE, BLOOD (ROUTINE X 2)
CULTURE: NO GROWTH
Culture: NO GROWTH
SPECIAL REQUESTS: ADEQUATE
Special Requests: ADEQUATE

## 2017-02-06 LAB — CBC
HCT: 30.8 % — ABNORMAL LOW (ref 39.0–52.0)
Hemoglobin: 10 g/dL — ABNORMAL LOW (ref 13.0–17.0)
MCH: 30.4 pg (ref 26.0–34.0)
MCHC: 32.5 g/dL (ref 30.0–36.0)
MCV: 93.6 fL (ref 78.0–100.0)
PLATELETS: 303 10*3/uL (ref 150–400)
RBC: 3.29 MIL/uL — AB (ref 4.22–5.81)
RDW: 14.9 % (ref 11.5–15.5)
WBC: 8.7 10*3/uL (ref 4.0–10.5)

## 2017-02-06 MED ORDER — INSULIN ASPART 100 UNIT/ML ~~LOC~~ SOLN: 0.0000 [IU] | Freq: Three times a day (TID) | SUBCUTANEOUS | 11 refills | Status: AC

## 2017-02-06 MED ORDER — TRAMADOL HCL 50 MG PO TABS: 50.0000 mg | ORAL_TABLET | Freq: Four times a day (QID) | ORAL | 0 refills | Status: AC | PRN

## 2017-02-06 MED ORDER — RIFAMPIN 300 MG PO CAPS: 300.0000 mg | ORAL_CAPSULE | Freq: Two times a day (BID) | ORAL | Status: DC

## 2017-02-06 MED ORDER — INSULIN ASPART PROT & ASPART (70-30 MIX) 100 UNIT/ML ~~LOC~~ SUSP: 20.0000 [IU] | Freq: Two times a day (BID) | SUBCUTANEOUS | 11 refills | Status: AC

## 2017-02-06 MED ORDER — SENNOSIDES-DOCUSATE SODIUM 8.6-50 MG PO TABS: 1.0000 | ORAL_TABLET | Freq: Two times a day (BID) | ORAL | Status: AC

## 2017-02-06 MED ORDER — VANCOMYCIN IV (FOR PTA / DISCHARGE USE ONLY)
1000.0000 mg | Freq: Two times a day (BID) | INTRAVENOUS | 0 refills | Status: DC
Start: 2017-02-06 — End: 2017-02-07

## 2017-02-06 MED ORDER — GLUCERNA SHAKE PO LIQD: 237.0000 mL | Freq: Two times a day (BID) | ORAL | 0 refills | Status: AC

## 2017-02-06 MED ORDER — NICOTINE 14 MG/24HR TD PT24: 14.0000 mg | MEDICATED_PATCH | Freq: Every day | TRANSDERMAL | 0 refills | Status: AC

## 2017-02-06 MED ORDER — POLYETHYLENE GLYCOL 3350 17 G PO PACK: 17.0000 g | PACK | Freq: Every day | ORAL | 0 refills | Status: AC | PRN

## 2017-02-06 MED ORDER — ACETAMINOPHEN 325 MG PO TABS: 650.0000 mg | ORAL_TABLET | Freq: Four times a day (QID) | ORAL | Status: AC | PRN

## 2017-02-06 NOTE — Consult Note (Addendum)
   [facility] CM Inpatient Consult   02/06/2017  Adrian Cross October 09, 1945 [id number]    Made aware of hospitalization by [place] Management office and Telephonic RNCM.   Went to bedside to speak with Mr. Adrian Cross at bedside to discuss [place] Management engagement. He is agreeable and [facility] Care Management written consent obtained.  Mr. Adrian Cross endorses his plan is to go to SNF at discharge. Inpatient LCSW currently awaiting insurance approval for [facility].  Mr. Adrian Cross lives alone. Primary Care MD is Dr. Berdine Cross Prefers for his daughterBarbaraann Cross- [phone] be contacted for post discharge calls and the like. States he does not have issues obtaining his medications. However, his daughter "Adrian Cross handles that". Reports his daughter takes him to MD appointments as well.   Discussed [facility] Care Management follow up while at SNF and when he eventually goes home.  Telephone call to patient's daughter- Adrian Cross at [phone] to discuss [place] Management services. Adrian Cross states she is appreciative of the support. Adrian Cross is agreeable to being called for post discharge calls and follow up. Adrian Cross states her father will move in with her after dc from SNF at address [address], [place], [place] [address].  Made Kim aware that [place] Management packet and contact information was left at bedside for her records as well.  Also Adrian Cross states her father's current address is [address], [place], [place] [address]. States the address in Epic is not correct.   Will notify [place] Management office of above.  Made inpatient RNCM and inpatient LCSW aware [facility] Care Management to follow post discharge.  Will follow closely and make appropriate [place] Management referrals once disposition is known.  Mr. Adrian Cross has [facility] Medicare and has a history of hypertension, diabetes type 2, recent laminectomy, fusion with hardware placement, MRSA bacteremia, abscess.    Adrian Rolling, MSN-Ed, RN,BSN [facility] Liaison [phone]

## 2017-02-06 NOTE — Progress Notes (Signed)
IV team called this RN to let him know that the pt's daughter gave consent for the PICC placement. However, the daughter wants the pt to have a bed at a SNF before the PICC is placed. Social Worker, Percell Locus, made aware and MD RAI made aware. Will continue to assess.

## 2017-02-06 NOTE — Progress Notes (Addendum)
Inpatient Diabetes Program Recommendations  AACE/ADA: New Consensus Statement on Inpatient Glycemic Control (2015)  Target Ranges:  Prepandial:   less than 140 mg/dL      Peak postprandial:   less than 180 mg/dL (1-2 hours)      Critically ill patients:  140 - 180 mg/dL   Lab Results  Component Value Date   GLUCAP 193 (H) 02/06/2017   HGBA1C 12.3 (H) 02/01/2017    Review of Glycemic Control  Results for Adrian Cross, Adrian Cross (MRN [id number]) as of 02/06/2017 14:42  Ref. Range 02/05/2017 14:01 02/05/2017 17:28 02/05/2017 22:13 02/06/2017 07:53 02/06/2017 11:50  Glucose-Capillary Latest Ref Range: 65 - 99 mg/dL 167 (H) 304 (H) 155 (H) 186 (H) 193 (H)    Diabetes history: Type 2   Current orders for Inpatient glycemic control: Novolog 0-15 units tid, Novolog mix 70/30 20 units bid  Inpatient Diabetes Program Recommendations: Agree with current medications for blood sugar management.   Met with patient regarding his diabetes- he has had it for approximately 10 years- he keeps all his insulin pens in the fridge and the one he's using in the cabinet.  He rotates his site for administration and usually uses his abdomen.  He does have occasional low blood sugars and uses regular soda, candy or juice- reminded to use just 4 oz and follow it with a meal (if it is time) or with a snack.    A1C 12.3%- patient is aware that the goal is "around 7-8%"- discussed recent infection and stress as impacting the A1C- sees MD q43month.  He tells me the last time he saw the doctor was around 3 months ago.  JGentry Fitz RN, BA, MHA, CDE Diabetes Coordinator Inpatient Diabetes Program  [phone](Team Pager) [phone]([place] 02/06/2017 2:47 PM

## 2017-02-06 NOTE — NC FL2 (Signed)
[place] LEVEL OF CARE SCREENING TOOL     IDENTIFICATION  Patient Name: Adrian Cross Birthdate: 06-30-1945 Sex: male Admission Date (Current Location): 02/01/2017  [facility] and [place] Number:  [employer] and Address:  [facility] [place]. [facility], [place] [address], [place], [place] [address]      Provider Number: [id number]  Attending Physician Name and Address:  Mendel Corning, MD  Relative Name and Phone Number:  [name] daughter, [phone]    Current Level of Care: Hospital Recommended Level of Care: [place] Prior Approval Number:    Date Approved/Denied:   PASRR Number: [id number]  Discharge Plan: SNF    Current Diagnoses: Patient Active Problem List   Diagnosis Date Noted  . MRSA bacteremia   . 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 Hardware complicating wound infection ([place])   . Bacteremia 02/01/2017  . Nausea and vomiting in adult patient   . Enteritis 11/29/2016  . Duodenitis 11/29/2016  . Ileus, postoperative ([place]) 11/06/2013  . Abdominal abscess 10/30/2013  . HTN (hypertension) 10/30/2013  . Diabetes mellitus ([place]) 10/30/2013  . Abdominal mass 10/30/2013    Orientation RESPIRATION BLADDER Height & Weight     Self, Situation, Place, Time  Normal Continent, External catheter Weight: 65.9 kg (145 lb 4.8 oz) Height:  5\' 4"  (162.6 cm)  BEHAVIORAL SYMPTOMS/MOOD NEUROLOGICAL BOWEL NUTRITION STATUS      Continent Diet(Please see DC Summary)  AMBULATORY STATUS COMMUNICATION OF NEEDS Skin   Limited Assist Verbally Normal                       Personal Care Assistance Level of Assistance  Bathing, Feeding, Dressing Bathing Assistance: Limited assistance Feeding assistance: Independent Dressing Assistance: Limited assistance     Functional Limitations Info             SPECIAL CARE FACTORS FREQUENCY  PT (By licensed PT)     PT Frequency: 5x/week              Contractures       Additional Factors Info  Code Status, Allergies Code Status Info: Full Allergies Info: NKA           Current Medications (02/06/2017):  This is the current hospital active medication list Current Facility-Administered Medications  Medication Dose Route Frequency Provider Last Rate Last Dose  . acetaminophen (TYLENOL) tablet 650 mg  650 mg Oral Q6H PRN Patrecia Pour, Christean Grief, MD   650 mg at 02/04/17 2040   Or  . acetaminophen (TYLENOL) suppository 650 mg  650 mg Rectal Q6H PRN Patrecia Pour, Christean Grief, MD      . enoxaparin (LOVENOX) injection 40 mg  40 mg Subcutaneous Q24H Patrecia Pour, Christean Grief, MD   40 mg at 02/05/17 2240  . feeding supplement (GLUCERNA SHAKE) (GLUCERNA SHAKE) liquid 237 mL  237 mL Oral BID BM Patrecia Pour, Christean Grief, MD   237 mL at 02/06/17 1012  . gabapentin (NEURONTIN) capsule 300 mg  300 mg Oral BID Patrecia Pour, Christean Grief, MD   300 mg at 02/06/17 0847  . hydrALAZINE (APRESOLINE) injection 10 mg  10 mg Intravenous Q6H PRN Patrecia Pour, Christean Grief, MD      . Influenza vac split quadrivalent PF (FLUZONE HIGH-DOSE) injection 0.5 mL  0.5 mL Intramuscular Tomorrow-1000 Rai, Ripudeep K, MD      . insulin aspart (novoLOG) injection 0-15 Units  0-15 Units Subcutaneous TID WC Doreatha Lew, MD   3  Units at 02/06/17 0848  . insulin aspart protamine- aspart (NOVOLOG MIX 70/30) injection 20 Units  20 Units Subcutaneous BID WC Rai, Ripudeep K, MD   20 Units at 02/06/17 0848  . lisinopril (PRINIVIL,ZESTRIL) tablet 40 mg  40 mg Oral Daily Patrecia Pour, Christean Grief, MD   40 mg at 02/06/17 0847  . nicotine (NICODERM CQ - dosed in mg/24 hours) patch 14 mg  14 mg Transdermal Daily Patrecia Pour, Christean Grief, MD   14 mg at 02/06/17 0847  . pantoprazole (PROTONIX) EC tablet 40 mg  40 mg Oral BID Patrecia Pour, Christean Grief, MD   40 mg at 02/06/17 0847  . polyethylene glycol (MIRALAX / GLYCOLAX) packet 17 g  17 g Oral Daily PRN Patrecia Pour, Christean Grief, MD   17 g at 02/04/17 0226  . rifampin (RIFADIN) capsule 300 mg  300 mg Oral  Q12H Carlyle Basques, MD   300 mg at 02/06/17 0848  . senna-docusate (Senokot-S) tablet 1 tablet  1 tablet Oral BID Rai, Ripudeep K, MD   1 tablet at 02/06/17 0847  . traMADol (ULTRAM) tablet 50 mg  50 mg Oral Q6H PRN Bodenheimer, Charles A, NP   50 mg at 02/05/17 1800  . vancomycin (VANCOCIN) IVPB 1000 mg/200 mL premix  1,000 mg Intravenous Q12H Laren Everts, [facility]   Stopped at 02/06/17 0128     Discharge Medications: Please see discharge summary for a list of discharge medications.  Relevant Imaging Results:  Relevant Lab Results:   Additional Information SSN [id number].   Benard Halsted, LCSWA

## 2017-02-06 NOTE — Progress Notes (Signed)
    [facility] for Infectious Disease  Reason for visit: Follow up on persistent MRSA bacteremia  ID History: Developed surgical site infection & MRSA bacteremia following spinal fusion performed C3-C6 1 month ago. I&D 11/9 with wound cultures growing MRSA. Blood cultures persistently grew MRSA. PICC removed and transferred to [facility] for TEE. Repeat CT with persistent 4.8 x 1.1 x 6.8 cm back abscess. Remains on Vancomycin. Bcx 11/15 NGTD.   Interval History, Subjective: Patient feels well, denied any nausea/vomiting. TEE yesterday with vegetation on eustachian valve. Pt still having some chills.   Physical Exam: Constitutional:  Vitals:   02/06/17 0606 02/06/17 0847  BP: 116/67 132/67  Pulse: 80   Resp: 18   Temp: 98.4 F (36.9 C)   SpO2: 100%    patient appears in NAD Respiratory: Normal respiratory effort; CTA B Cardiovascular: RRR GI: soft, nt, nd GU: Urinal with red urine at bedside (Rifampin)  Review of Systems: No complaints.   Lab Results  Component Value Date   WBC 8.7 02/06/2017   HGB 10.0 (L) 02/06/2017   HCT 30.8 (L) 02/06/2017   MCV 93.6 02/06/2017   PLT 303 02/06/2017    Lab Results  Component Value Date   CREATININE 0.94 02/06/2017   BUN 11 02/06/2017   NA 137 02/06/2017   K 3.8 02/06/2017   CL 103 02/06/2017   CO2 27 02/06/2017    Lab Results  Component Value Date   ALT 24 02/01/2017   AST 18 02/01/2017   ALKPHOS 66 02/01/2017     Microbiology: Recent Results (from the past 240 hour(s))  Culture, blood (Routine X 2) w Reflex to ID Panel     Status: None   Collection Time: 02/01/17  7:21 PM  Result Value Ref Range Status   Specimen Description BLOOD LEFT ARM  Final   Special Requests IN PEDIATRIC BOTTLE Blood Culture adequate volume  Final   Culture NO GROWTH 5 DAYS  Final   Report Status 02/06/2017 FINAL  Final  Culture, blood (Routine X 2) w Reflex to ID Panel     Status: None   Collection Time: 02/01/17  7:26 PM  Result Value Ref Range  Status   Specimen Description BLOOD LEFT ARM  Final   Special Requests IN PEDIATRIC BOTTLE Blood Culture adequate volume  Final   Culture NO GROWTH 5 DAYS  Final   Report Status 02/06/2017 FINAL  Final    Impression/Plan:  1. 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 Bcx 11/15 NGTD. TEE yesterday with vegetation but no significant valvular insufficiency.    B. CSW currently working on SNF placement. Patient will need 6 weeks IV Vancomycin and Rifampin x2 weeks.

## 2017-02-06 NOTE — Progress Notes (Signed)
Patient's daughter has decided that she would like for patient to discharge to [facility]. CSW sent referral and submitted to [facility] for review.  Percell Locus Holle Sprick LCSWA [phone]

## 2017-02-06 NOTE — Progress Notes (Signed)
[employer] still has the case under review since patient was recently at [facility]. [place] unable to accept Letter of Guarantee. CSW to continue to wait for [employer].   Adrian Cross LCSWA [phone]

## 2017-02-06 NOTE — Progress Notes (Addendum)
[facility]                                                                              Patient Demographics  Adrian Cross, is a 71 y.o. male, DOB - 18-Aug-1945, LEX:[id number]  Admit date - 02/01/2017   Admitting Physician Pratik Darleen Crocker, DO  Outpatient Primary MD for the patient is Iona Beard, MD  Outpatient specialists:   LOS - 5  days   Medical records reviewed and are as summarized below:    No chief complaint on file.      Brief summary   Patient is a 71 year old male with hypertension, diabetes type 2, recent laminectomy, fusion with hardware placement done at [facility], then presented to [facility] hospital with altered mental status and was found to have MRSA bacteremia.  Patient was found to have infection at the surgical site/cervical spine which was debrided by neurosurgery, per outside record hardware was not infected. Patient continued to have persistently blood cultures positive for MRSA and was transferred here for TEE, scheduled on 11/19.   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  Patient underwent I&D on 11/9 of the back abscess, cultures grew MRSA from the cervical wound. -Per outside report, hardware was not infected.  Blood cultures persistently grew MRSA. Patient had a PICC line which was removed PTA - Repeat CT scan showed persistent 4.8 x1.1 x6.8 cm gas and fluid filled paraspinal collection along the posterior surgical site -Blood cultures 11/15 so far negative -Seen by neurosurgery, Dr Kathyrn Sheriff, recommended continue antibiotics, follow blood cultures and TEE.  Currently second wound washout is not indicated unless patient develops progressive neurological decline from the mass-effect.  In addition, recommended should be done by his primary neurosurgeon at [facility], Dr.  Carloyn Manner -TEE showed possible vegetation with highly mobile structure attached to the end of the eustachian valve in the right atrium 1 x 0.8 cm at the maximum thickness -ID recommended continue 8 weeks of IV vancomycin and rifampin x 2 weeks, place PICC line,   Hypertension BP stable, continue lisinopril, hydralazine as needed  Diabetes type 2 with neuropathy -Poorly controlled A1c 12.3, -Increase NovoLog 70/30 to 20 units twice a day, continue sliding scale insulin -Will need further adjustment of the insulin outpatient  Tobacco use -Patient recommended to quit smoking, continue nicotine patch   Code Status: Full CODE STATUS DVT Prophylaxis:  Lovenox Family Communication: Discussed in detail with the patient, all imaging results, lab, TEE results explained to the patient, d/w daughter yesterday on 11/19  Disposition Plan: PICC line pending, daughter states that she will consent for PICC line only when skilled nursing facility bed is confirmed  Time Spent in minutes  15 minutes  Procedures:    Consultants:   IR Infectious disease Neurosurgery  Antimicrobials:   IV vancomycin   Medications  Scheduled Meds: . enoxaparin (LOVENOX) injection  40 mg Subcutaneous Q24H  . feeding supplement (GLUCERNA SHAKE)  237 mL Oral BID BM  . gabapentin  300 mg Oral BID  . Influenza vac split quadrivalent PF  0.5 mL Intramuscular Tomorrow-1000  . insulin aspart  0-15 Units Subcutaneous TID WC  . insulin aspart protamine- aspart  20 Units Subcutaneous BID WC  . lisinopril  40 mg Oral Daily  . nicotine  14 mg Transdermal Daily  . pantoprazole  40 mg Oral BID  . rifampin  300 mg Oral Q12H  . senna-docusate  1 tablet Oral BID   Continuous Infusions: . vancomycin 1,000 mg (02/06/17 1208)   PRN Meds:.acetaminophen **OR** acetaminophen, hydrALAZINE, polyethylene glycol, traMADol   Antibiotics   Anti-infectives (From admission, onward)   Start     Dose/Rate Route Frequency Ordered Stop     02/06/17 0000  vancomycin IVPB     1,000 mg Intravenous Every 12 hours 02/06/17 1229     02/06/17 0000  rifampin (RIFADIN) 300 MG capsule     300 mg Oral 2 times daily 02/06/17 1229     02/05/17 1000  rifampin (RIFADIN) capsule 300 mg     300 mg Oral Every 12 hours 02/05/17 0946     02/04/17 1200  vancomycin (VANCOCIN) IVPB 1000 mg/200 mL premix     1,000 mg 200 mL/hr over 60 Minutes Intravenous Every 12 hours 02/03/17 2347     02/02/17 0800  vancomycin (VANCOCIN) 1,250 mg in sodium chloride 0.9 % 250 mL IVPB  Status:  Discontinued     1,250 mg 166.7 mL/hr over 90 Minutes Intravenous Every 12 hours 02/02/17 0519 02/03/17 2345   02/01/17 1900  vancomycin (VANCOCIN) 1,250 mg in sodium chloride 0.9 % 250 mL IVPB  Status:  Discontinued     1,250 mg 166.7 mL/hr over 90 Minutes Intravenous Every 12 hours 02/01/17 1846 02/02/17 0519        Subjective:   Adrian Cross was seen and examined today.  Denies any specific complaints.  No fevers or chills.  Back pain controlled.  Patient denies dizziness, chest pain, shortness of breath, abdominal pain, N/V  Objective:   Vitals:   02/05/17 2218 02/05/17 2239 02/06/17 0606 02/06/17 0847  BP: (!) 91/58 (!) 109/56 116/67 132/67  Pulse: 92  80   Resp: (!) 24  18   Temp: 100 F (37.8 C)  98.4 F (36.9 C)   TempSrc: Oral  Oral   SpO2: 97%  100%   Weight:      Height:        Intake/Output Summary (Last 24 hours) at 02/06/2017 1232 Last data filed at 02/06/2017 1032 Gross per 24 hour  Intake 780 ml  Output 450 ml  Net 330 ml     Wt Readings from Last 3 Encounters:  02/01/17 65.9 kg (145 lb 4.8 oz)  11/29/16 77.8 kg (171 lb 9.6 oz)  10/30/13 70.6 kg (155 lb 10.3 oz)     Exam   General: Alert and oriented, NAD  Eyes:   HEENT:    Cardiovascular: S1 S2 clear, RRR. No pedal edema b/l  Respiratory: CTAB  Gastrointestinal: Soft, nontender, nondistended, + bowel sounds  Ext: no pedal edema bilaterally  Neuro: no new  deficits  Musculoskeletal: No digital cyanosis, clubbing  Skin: No rashes  Psych: Normal affect and demeanor, alert and oriented x3    Data Reviewed:  I have personally reviewed following labs and imaging studies  Micro Results Recent Results (from the past 240 hour(s))  Culture, blood (Routine X 2) w Reflex to ID Panel     Status: None   Collection  Time: 02/01/17  7:21 PM  Result Value Ref Range Status   Specimen Description BLOOD LEFT ARM  Final   Special Requests IN PEDIATRIC BOTTLE Blood Culture adequate volume  Final   Culture NO GROWTH 5 DAYS  Final   Report Status 02/06/2017 FINAL  Final  Culture, blood (Routine X 2) w Reflex to ID Panel     Status: None   Collection Time: 02/01/17  7:26 PM  Result Value Ref Range Status   Specimen Description BLOOD LEFT ARM  Final   Special Requests IN PEDIATRIC BOTTLE Blood Culture adequate volume  Final   Culture NO GROWTH 5 DAYS  Final   Report Status 02/06/2017 FINAL  Final    Radiology Reports Ct Soft Tissue Neck W Contrast  Result Date: 02/02/2017 CLINICAL DATA:  Initial evaluation for bacteremia, back pain, infection of the neck at site of incision. EXAM: CT NECK WITH CONTRAST TECHNIQUE: Multidetector CT imaging of the neck was performed using the standard protocol following the bolus administration of intravenous contrast. CONTRAST:  <See Chart> ISOVUE-300 IOPAMIDOL (ISOVUE-300) INJECTION 61% COMPARISON:  Prior CT from 01/23/2017. FINDINGS: Pharynx and larynx: Oral cavity within normal limits. Patient is edentulous. Palatine tonsils symmetric and within normal limits. Calcified tonsilliths noted. Remainder of the oropharynx and nasopharynx within normal limits. Retropharyngeal soft tissues within normal limits. Epiglottis normal. Vallecula clear. Remainder of the hypopharynx and supraglottic larynx normal. Subglottic airway clear. Salivary glands: Unremarkable. Thyroid: Unremarkable. Lymph nodes: No pathologically enlarged lymph nodes  identified within the neck. Vascular: Aortic atherosclerosis with prominent carotid bifurcation calcifications. Carotid siphon calcifications noted as well. Limited intracranial: Unremarkable. Visualized orbits: Visualized globes and orbital soft tissues within normal limits. Mastoids and visualized paranasal sinuses: Chronic left sphenoid sinusitis. Mastoids and middle ear cavities are clear. Skeleton: Postoperative changes from prior laminectomies at C3 through C5, with posterior instrumentation at C3 through C6. Hardware well positioned without complication. Multilevel degenerative spondylolysis again noted Upper chest: Visualized upper mediastinum within normal limits. Severe centrilobular and bullous emphysema. Other: Irregular gas and fluid collection along the mid midline posterior incision measures 4.8 x 1.1 x 6.8 cm (AP by transverse by craniocaudad). This lies at the level of C3 through C7. Mildly thickened rim enhancement about this collection as compared to previous adjacent paraspinous muscular edema slightly more prominent as compared to previous exam. IMPRESSION: 1. Persistent 4.8 x 1.1 x 6.8 cm gas and fluid containing paraspinal collection along the posterior surgical approach. Again, finding may reflect persistent postoperative seroma or possibly abscess. Percutaneous sampling recommended if concern for infection. 2. Status post C3 through C5 laminectomy with posterior instrumentation at C3 through C6. 3. Atherosclerosis with severe emphysema. Electronically Signed   By: Jeannine Boga M.D.   On: 02/02/2017 01:17    Lab Data:  CBC: Recent Labs  Lab 02/01/17 1926 02/02/17 0410 02/06/17 0319  WBC 8.0 7.0 8.7  NEUTROABS 4.7  --   --   HGB 10.6* 10.0* 10.0*  HCT 32.1* 30.8* 30.8*  MCV 90.9 91.4 93.6  PLT 228 226 024   Basic Metabolic Panel: Recent Labs  Lab 02/01/17 1926 02/02/17 0410 02/05/17 0210 02/06/17 0319  NA 135 135 136 137  K 3.5 3.9 4.1 3.8  CL 103 104 105 103   CO2 25 26 24 27   GLUCOSE 157* 236* 275* 202*  BUN 6 9 9 11   CREATININE 0.78 0.87 0.88 0.94  CALCIUM 9.6 9.4 9.6 9.6   GFR: Estimated Creatinine Clearance: 62.1 mL/min (by C-G formula based  on SCr of 0.94 mg/dL). Liver Function Tests: Recent Labs  Lab 02/01/17 1926  AST 18  ALT 24  ALKPHOS 66  BILITOT 0.5  PROT 6.9  ALBUMIN 2.6*   No results for input(s): LIPASE, AMYLASE in the last 168 hours. No results for input(s): AMMONIA in the last 168 hours. Coagulation Profile: No results for input(s): INR, PROTIME in the last 168 hours. Cardiac Enzymes: No results for input(s): CKTOTAL, CKMB, CKMBINDEX, TROPONINI in the last 168 hours. BNP (last 3 results) No results for input(s): PROBNP in the last 8760 hours. HbA1C: No results for input(s): HGBA1C in the last 72 hours. CBG: Recent Labs  Lab 02/05/17 1401 02/05/17 1728 02/05/17 2213 02/06/17 0753 02/06/17 1150  GLUCAP 167* 304* 155* 186* 193*   Lipid Profile: No results for input(s): CHOL, HDL, LDLCALC, TRIG, CHOLHDL, LDLDIRECT in the last 72 hours. Thyroid Function Tests: No results for input(s): TSH, T4TOTAL, FREET4, T3FREE, THYROIDAB in the last 72 hours. Anemia Panel: No results for input(s): VITAMINB12, FOLATE, FERRITIN, TIBC, IRON, RETICCTPCT in the last 72 hours. Urine analysis:    Component Value Date/Time   COLORURINE STRAW (A) 11/29/2016 0933   APPEARANCEUR CLEAR 11/29/2016 0933   LABSPEC 1.023 11/29/2016 0933   PHURINE 8.0 11/29/2016 0933   GLUCOSEU >=500 (A) 11/29/2016 0933   HGBUR NEGATIVE 11/29/2016 0933   BILIRUBINUR NEGATIVE 11/29/2016 0933   KETONESUR 20 (A) 11/29/2016 0933   PROTEINUR NEGATIVE 11/29/2016 0933   NITRITE NEGATIVE 11/29/2016 0933   LEUKOCYTESUR NEGATIVE 11/29/2016 0933     Lesli Issa M.D. [facility] 02/06/2017, 12:32 PM  Pager: [phone] Between 7am to 7pm - call Pager - [phone]  After 7pm go to [email or web address] - password [email or web address]  Call night coverage person  covering after 7pm

## 2017-02-06 NOTE — Progress Notes (Signed)
Physical Therapy Treatment Patient Details Name: Adrian Cross MRN: [id number] DOB: 01-17-1946 Today's Date: 02/06/2017    History of Present Illness 71 year old male with hypertension, diabetes type 2, recent laminectomy, fusion with hardware placement done at [facility], then presented to [facility] with altered mental status and was found to have MRSA bacteremia.  Patient was found to have infection at the surgical site/cervical spine which was debrided by neurosurgery, per outside record hardware was not infected. Patient continued to have persistently blood cultures positive for MRSA and was transferred here for TEE, scheduled on 11/19.    PT Comments    During ambulation pt demonstrated significant weakness in the R LE causing knee buckling and genu recurvatum. Min A for safety with gait. Provided pt with strengthening exercised for R knee. Will continue to follow acutely to maximize safety with mobility and functional independence.    Follow Up Recommendations  SNF;Supervision/Assistance - 24 hour(return to rehabilitation center to finish therapies)     Equipment Recommendations  None recommended by PT    Recommendations for Other Services       Precautions / Restrictions Precautions Precautions: Fall Restrictions Weight Bearing Restrictions: No    Mobility  Bed Mobility               General bed mobility comments: in chair on arrival  Transfers Overall transfer level: Needs assistance Equipment used: Rolling walker (2 wheeled) Transfers: Sit to/from Stand Sit to Stand: Min assist         General transfer comment: Pt attempted to stand 3x in rapid succession w/o assist. Cued pt to slow down and corrected technique. Pt required min A to power up into standing from recliner with use of armrests.  Ambulation/Gait Ambulation/Gait assistance: Min assist Ambulation Distance (Feet): 125 Feet Assistive device: Rolling walker (2  wheeled) Gait Pattern/deviations: Step-through pattern;Decreased stride length;Trunk flexed;Narrow base of support Gait velocity: decreased Gait velocity interpretation: Below normal speed for age/gender General Gait Details: Min A for gait as pt's R LE began buckling during ambulation. Gave manual facilitation at quad to encourage knee extention, however pt's knee would snap into hyperextension.    Stairs            Wheelchair Mobility    Modified Rankin (Stroke Patients Only)       Balance Overall balance assessment: Needs assistance   Sitting balance-Leahy Scale: Fair     Standing balance support: During functional activity Standing balance-Leahy Scale: Poor Standing balance comment: reliance on UE support                            Cognition Arousal/Alertness: Awake/alert Behavior During Therapy: WFL for tasks assessed/performed Overall Cognitive Status: Within Functional Limits for tasks assessed                                 General Comments: Alert and oriented x4      Exercises General Exercises - Lower Extremity Quad Sets: Strengthening;Right;10 reps;Seated Long Arc Quad: Strengthening;Right;10 reps;Seated    General Comments        Pertinent Vitals/Pain Pain Assessment: Faces Faces Pain Scale: Hurts little more Pain Location: neck Pain Descriptors / Indicators: Sore;Discomfort Pain Intervention(s): Monitored during session    Home Living                      Prior  Function            PT Goals (current goals can now be found in the care plan section) Acute Rehab PT Goals Patient Stated Goal: to get back home PT Goal Formulation: With patient Time For Goal Achievement: 02/16/17 Potential to Achieve Goals: Good Progress towards PT goals: Progressing toward goals    Frequency    Min 3X/week      PT Plan Current plan remains appropriate    Co-evaluation              AM-PAC PT "6 Clicks"  Daily Activity  Outcome Measure  Difficulty turning over in bed (including adjusting bedclothes, sheets and blankets)?: A Little Difficulty moving from lying on back to sitting on the side of the bed? : Unable Difficulty sitting down on and standing up from a chair with arms (e.g., wheelchair, bedside commode, etc,.)?: Unable Help needed moving to and from a bed to chair (including a wheelchair)?: A Little Help needed walking in hospital room?: A Little Help needed climbing 3-5 steps with a railing? : A Lot 6 Click Score: 13    End of Session Equipment Utilized During Treatment: Gait belt Activity Tolerance: Patient tolerated treatment well Patient left: in chair;with call bell/phone within reach Nurse Communication: Mobility status PT Visit Diagnosis: Unsteadiness on feet (R26.81);Difficulty in walking, not elsewhere classified (R26.2)     Time: 8978-4784 PT Time Calculation (min) (ACUTE ONLY): 30 min  Charges:  $Gait Training: 8-22 mins $Therapeutic Activity: 8-22 mins                    G Codes:       Benjiman Core, [place] Pager [phone] Acute Rehab   Allena Katz 02/06/2017, 3:33 PM

## 2017-02-06 NOTE — Progress Notes (Signed)
PHARMACY CONSULT NOTE FOR:  OUTPATIENT  PARENTERAL ANTIBIOTIC THERAPY (OPAT)  Indication:  Developed surgical site infection & MRSA bacteremia following spinal fusion performed C3-C6 1 month ago Regimen:  Vancomycin 1 gram IV every 12 hours  End date:  04/03/16 (8 weeks total of therapy)   IV antibiotic discharge orders are pended. To discharging provider:  please sign these orders via discharge navigator,  Select New Orders & click on the button choice - Manage This Unsigned Work.     Thank you for allowing pharmacy to be a part of this patient's care.  Vincenza Hews, PharmD, BCPS 02/06/2017, 8:06 AM

## 2017-02-06 NOTE — Progress Notes (Signed)
Pharmacy Antibiotic Note Adrian Cross is a 71 y.o. male admitted on 02/01/2017 with disseminated MRSA bacteremia with cervical surgical site infection. Pharmacy following for vancomycin.   Pt remains on vancomycin 1 gram IV every 12 hours for MRSA bacteremia. SCr remains stable. ID planning for 8 weeks therapy.   Plan: 1. Continue vancomycin 1 gram IV every 12 hours  2. If pt remains inpatient will obtain vancomycin trough later this week 3. OPAT has been completed   4. Continues on Rifampin 300 mg BID    Height: 5\' 4"  (162.6 cm) Weight: 145 lb 4.8 oz (65.9 kg) IBW/kg (Calculated) : 59.2  Temp (24hrs), Avg:98.6 F (37 C), Min:97.8 F (36.6 C), Max:100 F (37.8 C)  Recent Labs  Lab 02/01/17 1926 02/02/17 0410 02/03/17 2108 02/05/17 0210 02/06/17 0319  WBC 8.0 7.0  --   --  8.7  CREATININE 0.78 0.87  --  0.88 0.94  VANCOTROUGH  --   --  23*  --   --     Estimated Creatinine Clearance: 62.1 mL/min (by C-G formula based on SCr of 0.94 mg/dL).    No Known Allergies  Antimicrobials this admission:  Vancomycin 11/8 >>   Rifampin 11/19 >>    Dose adjustments this admission: 11/11 trough = 14 (drawn early- suspected lower per discussion w/ Illene Regulus at [facility], on 1g q12, SCr  >> increased to 1250 q12) 11/13 trough =  15.5 (continued 1250 q12) 11/17 trough = 23 (lowered to 1000mg  q12)  Microbiology results: 11/15 BCx: px Blood ([facility]) 11/8 - 1/2 MRSA Cervical Wound 11/9 - MRSA Blood 11/11- 1/2 MRSA  Thank you for allowing pharmacy to be a part of this patient's care.  Vincenza Hews, PharmD, BCPS 02/06/2017, 8:27 AM   '

## 2017-02-07 ENCOUNTER — Other Ambulatory Visit: Payer: Self-pay | Admitting: Licensed Clinical Social Worker

## 2017-02-07 ENCOUNTER — Other Ambulatory Visit: Payer: Self-pay

## 2017-02-07 DIAGNOSIS — M79601 Pain in right arm: Secondary | ICD-10-CM

## 2017-02-07 LAB — GLUCOSE, CAPILLARY
GLUCOSE-CAPILLARY: 158 mg/dL — AB (ref 65–99)
Glucose-Capillary: 222 mg/dL — ABNORMAL HIGH (ref 65–99)
Glucose-Capillary: 215 mg/dL — ABNORMAL HIGH (ref 65–99)
Glucose-Capillary: 79 mg/dL (ref 65–99)

## 2017-02-07 LAB — VANCOMYCIN, TROUGH: Vancomycin Tr: 13 ug/mL — ABNORMAL LOW (ref 15–20)

## 2017-02-07 MED ORDER — RIFAMPIN 300 MG PO CAPS: 300.0000 mg | ORAL_CAPSULE | Freq: Two times a day (BID) | ORAL | Status: DC

## 2017-02-07 MED ORDER — VANCOMYCIN HCL 10 G IV SOLR
1250.0000 mg | Freq: Two times a day (BID) | INTRAVENOUS | Status: DC
  Administered 2017-02-07 – 2017-02-08 (×2): 1250 mg via INTRAVENOUS
  Filled 2017-02-07 (×2): qty 1250

## 2017-02-07 MED ORDER — VANCOMYCIN IV (FOR PTA / DISCHARGE USE ONLY): 1000.0000 mg | Freq: Two times a day (BID) | INTRAVENOUS | 0 refills | Status: DC

## 2017-02-07 NOTE — Progress Notes (Signed)
Adrian Cross for Infectious Disease  Reason for visit: Follow up on persistent MRSA bacteremia  ID History: Developed surgical site infection & MRSA bacteremia following spinal fusion performed C3-C6 1 month ago. I&D 11/9 with wound cultures growing MRSA. Blood cultures persistently grew MRSA. PICC removed and transferred to [facility] for TEE. Repeat CT with persistent 4.8 x 1.1 x 6.8 cm back abscess. Remains on Vancomycin. Bcx 11/15 NGTD. TEE 11/19 with vegetation.  Interval History, Subjective: Pt noted swelling and pain of right forearm. Palpable cord. Pt feels well. No drainage from incision. Eating and drinking well.   Physical Exam: Constitutional: Alert, NAD. Sitting upright in bedside chair. Complains of RUE pain. Vitals:   02/07/17 0440 02/07/17 0905  BP: 109/68 134/75  Pulse: 77   Resp: 20   Temp: 98.4 F (36.9 C)   SpO2: 100%   Neck: minimal tenderness of c-spine. STITCHES STILL IN PLACE. No drainage.  Respiratory: Normal respiratory effort; CTA B Cardiovascular: RRR GI: soft, nt, nd Extremities: R forearm with palpable cord. Good pulse.  Review of Systems: No complaints.   Lab Results  Component Value Date   WBC 8.7 02/06/2017   HGB 10.0 (L) 02/06/2017   HCT 30.8 (L) 02/06/2017   MCV 93.6 02/06/2017   PLT 303 02/06/2017    Lab Results  Component Value Date   CREATININE 0.94 02/06/2017   BUN 11 02/06/2017   NA 137 02/06/2017   K 3.8 02/06/2017   CL 103 02/06/2017   CO2 27 02/06/2017    Lab Results  Component Value Date   ALT 24 02/01/2017   AST 18 02/01/2017   ALKPHOS 66 02/01/2017    Microbiology: Recent Results (from the past 240 hour(s))  Culture, blood (Routine X 2) w Reflex to ID Panel     Status: None   Collection Time: 02/01/17  7:21 PM  Result Value Ref Range Status   Specimen Description BLOOD LEFT ARM  Final   Special Requests IN PEDIATRIC BOTTLE Blood Culture adequate volume  Final   Culture NO GROWTH 5 DAYS  Final   Report Status  02/06/2017 FINAL  Final  Culture, blood (Routine X 2) w Reflex to ID Panel     Status: None   Collection Time: 02/01/17  7:26 PM  Result Value Ref Range Status   Specimen Description BLOOD LEFT ARM  Final   Special Requests IN PEDIATRIC BOTTLE Blood Culture adequate volume  Final   Culture NO GROWTH 5 DAYS  Final   Report Status 02/06/2017 FINAL  Final   Impression/Plan:  1. 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 CSW continuing working on SNF placement.   B. Patient will need 8 weeks IV Vancomycin and Rifampin '300mg'$  BID. Vanc trough 15-20.   C. Ab end date 01/26/17. Will need weekly CBC w/ diff, BMP (twice weekly), CRP, ESR and Vanc trough while on antibiotics.   D. ?Stitch removal  E. ID signing off 2. New R Forearm swelling, pain with palpable cord: ?Superficial phlebitis/thrombophlebitis.   A. Doppler

## 2017-02-07 NOTE — Progress Notes (Signed)
[employer] case worker, [name] [phone] ext [phone]), requested documents, which were sent.  Adrian Cross LCSWA [phone]

## 2017-02-07 NOTE — Consult Note (Signed)
   [facility] CM Inpatient Consult   02/07/2017  Adrian Cross March 25, 1945 [id number]    [facility] Care Management follow up.  Spoke with (covering) inpatient LCSW. Adrian Cross received [name] for SNF placement. To discharge to [facility].  Will make referral to [facility] for follow up while at [facility].  [name]- [phone] to be contacted for post discharge calls and the like.     Marthenia Rolling, MSN-Ed, RN,BSN [facility] Liaison [phone]

## 2017-02-07 NOTE — Progress Notes (Signed)
CSW following to facilitate discharge planning. [employer] has authorized SNF placement at [facility] however, patient still needs PICC placed. CSW confirmed with Ronney Lion that they can still admit the patient tomorrow if he does not get the PICC placed before the end of the day today.  CSW will continue to follow.  Laveda Abbe, [place] Clinical Social Worker [phone]

## 2017-02-07 NOTE — Patient Outreach (Signed)
Assessment:  CSW Adrian Cross had received referral on Adrian Cross. However, after discussion via phone with [facility], on 02/07/17,. CSW was informed that client would not be returning at this time to [facility] area. Adrian Cross informed CSW on 02/07/17 that client planned to discharge from the hospital to go to [facility] facility in [place], [place]. Adrian Cross informed CSW that after facility care, client planned to go to the home of his daughter, Adrian Cross, in [place], [place].  Adrian Cross sent in basket again to Adrian Cross on 02/07/17 informing Adrian Cross of this client plan. Adrian Cross informed CSW on 02/07/17 that Adrian Cross would remove CSW Adrian Cross from care team for client. Adrian Cross said she would resend this order for CSW support for Adrian Cross to [facility] CSW in [place] area where client plans to reside.      Plan:  Adrian Cross, Case Management Assistant, to resend order for CSW support for this client to [facility] in [place], [place], covering area where client will reside.  Adrian Cross.Adrian Cross MSW, LCSW Licensed Clinical Social Worker [facility] Care Management [phone]

## 2017-02-07 NOTE — Progress Notes (Signed)
PROGRESS NOTE [facility]   MICKEY ESGUERRA   ZTI:[id number] DOB: [birth year]  DOA: 02/01/2017 PCP: Iona Beard, MD   Brief Narrative:  Adrian Cross 71 year old male with significant medical history of hypertension, diabetes type 2 and spinal fusion of C3-6.  Who was transferred from [facility] due to persistent MRSA bacteremia and to perform TEE.  Patient was found to have a cervical/neck abscess which was I&D 11/9 by neurosurgery, per outside record hardware was not infected.  Despite patient being on vancomycin continues to have blood cultures positive to MRSA.  Repeat CT showed persistent 4.8 x 1.1 x 6.8 cm gas and fluid filled paraspinal collection along the posterior surgical approach. ID was consulted and blood cultures were repeated   Subjective: Patient seen and examined, he has no complaints today.   Assessment & Plan: MRSA Bacteremia due to neck/back abscess - MRSA discitis  Patient underwent spinal fusion of C3-C6 about a month ago, now developed infection at the surgical site. Patient underwent I&D 11/9 (back abscess) at outside hospital with cultures showing MRSA from cervical wound. Per outside report hardware was not infected.  Patient had PICC line which was removed PTA  Repeat CT showed persistent 4.8 x 1.1 x 6.8 cm gas and fluid filled paraspinal collection along the posterior surgical approach - Case discussed with IR - recommended Neurosurgical evaluation whom recommended to continue abx no wash out, if intervention needed, will have to done by original surgeon, Dr Carloyn Manner  TEE showed possible vegetation with high mobile structure attached to the end of the eustachian valve - may represent endocarditis   ID recommendations appreciated - will continue IV vancomycin for 8 weeks plus oral rifampin 300 mg BID  Will insert PICC line  Neck suture removal in 2 days  Repeated blood cultures negative so far  Follow up with ID clinic in 4-6 weeks   HTN  BP remains  stable  Continue Lisinopril  Continue to monitor   R forearm swelling - likely phlebitis F/U doppler r/o thrombophlebitis   DM type 2 with neuropathy - CBG's stable, poorly controlled with hyperglycemia  A1C, 12.3 Insulin 75/25 20 units Continue gabapentin  Will need adjustment as outpatient   Tobacco abuse  Nicotine patch . Counseling   DVT prophylaxis: Lovenox  Code Status: Full Code Family Communication: Discussed with daughter via phone call  Disposition Plan: SNF in AM    Consultants:   ID   Neurosurgery   Procedures:   None   Antimicrobials:  Vancomycin   Rifampin    Objective: Vitals:   02/06/17 2258 02/07/17 0440 02/07/17 0905 02/07/17 1512  BP: 119/63 109/68 134/75 (!) 154/63  Pulse: 80 77  80  Resp: (!) 24 20  20   Temp: 98.7 F (37.1 C) 98.4 F (36.9 C)  98.6 F (37 C)  TempSrc: Oral Oral  Oral  SpO2: 100% 100%  100%  Weight:      Height:        Intake/Output Summary (Last 24 hours) at 02/07/2017 1519 Last data filed at 02/07/2017 1512 Gross per 24 hour  Intake 1020 ml  Output 1000 ml  Net 20 ml   Filed Weights   02/01/17 1723  Weight: 65.9 kg (145 lb 4.8 oz)    Examination:  General: Pt is alert, awake, not in acute distress Neck: Sutures in place, no drainage or signs of infection  Cardiovascular: RRR, S1/S2 +, no rubs, no gallops Respiratory: CTA bilaterally, no wheezing, no rhonchi Abdominal: Soft  Extremities: R forearm swelling and tenderness, no erythema   Data Reviewed: I have personally reviewed following labs and imaging studies  CBC: Recent Labs  Lab 02/01/17 1926 02/02/17 0410 02/06/17 0319  WBC 8.0 7.0 8.7  NEUTROABS 4.7  --   --   HGB 10.6* 10.0* 10.0*  HCT 32.1* 30.8* 30.8*  MCV 90.9 91.4 93.6  PLT 228 226 220   Basic Metabolic Panel: Recent Labs  Lab 02/01/17 1926 02/02/17 0410 02/05/17 0210 02/06/17 0319  NA 135 135 136 137  K 3.5 3.9 4.1 3.8  CL 103 104 105 103  CO2 25 26 24 27   GLUCOSE  157* 236* 275* 202*  BUN 6 9 9 11   CREATININE 0.78 0.87 0.88 0.94  CALCIUM 9.6 9.4 9.6 9.6   GFR: Estimated Creatinine Clearance: 62.1 mL/min (by C-G formula based on SCr of 0.94 mg/dL). Liver Function Tests: Recent Labs  Lab 02/01/17 1926  AST 18  ALT 24  ALKPHOS 66  BILITOT 0.5  PROT 6.9  ALBUMIN 2.6*   No results for input(s): LIPASE, AMYLASE in the last 168 hours. No results for input(s): AMMONIA in the last 168 hours. Coagulation Profile: No results for input(s): INR, PROTIME in the last 168 hours. Cardiac Enzymes: No results for input(s): CKTOTAL, CKMB, CKMBINDEX, TROPONINI in the last 168 hours. BNP (last 3 results) No results for input(s): PROBNP in the last 8760 hours. HbA1C: No results for input(s): HGBA1C in the last 72 hours. CBG: Recent Labs  Lab 02/06/17 1150 02/06/17 1702 02/06/17 2252 02/07/17 0805 02/07/17 1155  GLUCAP 193* 199* 159* 158* 222*   Lipid Profile: No results for input(s): CHOL, HDL, LDLCALC, TRIG, CHOLHDL, LDLDIRECT in the last 72 hours. Thyroid Function Tests: No results for input(s): TSH, T4TOTAL, FREET4, T3FREE, THYROIDAB in the last 72 hours. Anemia Panel: No results for input(s): VITAMINB12, FOLATE, FERRITIN, TIBC, IRON, RETICCTPCT in the last 72 hours. Sepsis Labs: No results for input(s): PROCALCITON, LATICACIDVEN in the last 168 hours.  Recent Results (from the past 240 hour(s))  Culture, blood (Routine X 2) w Reflex to ID Panel     Status: None   Collection Time: 02/01/17  7:21 PM  Result Value Ref Range Status   Specimen Description BLOOD LEFT ARM  Final   Special Requests IN PEDIATRIC BOTTLE Blood Culture adequate volume  Final   Culture NO GROWTH 5 DAYS  Final   Report Status 02/06/2017 FINAL  Final  Culture, blood (Routine X 2) w Reflex to ID Panel     Status: None   Collection Time: 02/01/17  7:26 PM  Result Value Ref Range Status   Specimen Description BLOOD LEFT ARM  Final   Special Requests IN PEDIATRIC BOTTLE  Blood Culture adequate volume  Final   Culture NO GROWTH 5 DAYS  Final   Report Status 02/06/2017 FINAL  Final      Radiology Studies: No results found.    Scheduled Meds: . enoxaparin (LOVENOX) injection  40 mg Subcutaneous Q24H  . feeding supplement (GLUCERNA SHAKE)  237 mL Oral BID BM  . gabapentin  300 mg Oral BID  . Influenza vac split quadrivalent PF  0.5 mL Intramuscular Tomorrow-1000  . insulin aspart  0-15 Units Subcutaneous TID WC  . insulin aspart protamine- aspart  20 Units Subcutaneous BID WC  . lisinopril  40 mg Oral Daily  . nicotine  14 mg Transdermal Daily  . pantoprazole  40 mg Oral BID  . rifampin  300 mg Oral Q12H  .  senna-docusate  1 tablet Oral BID   Continuous Infusions: . vancomycin       LOS: 6 days    Time spent: Total of 25 minutes spent with pt, greater than 50% of which was spent in discussion of  treatment, counseling and coordination of care   Chipper Oman, MD Pager: Text Page via [email or web address]   If 7PM-7AM, please contact night-coverage [email or web address] 02/07/2017, 3:19 PM

## 2017-02-07 NOTE — Progress Notes (Signed)
Nutrition Follow-up  DOCUMENTATION CODES:   Not applicable  INTERVENTION:   -Continue Glucerna Shake po BID, each supplement provides 220 kcal and 10 grams of protein  NUTRITION DIAGNOSIS:   Increased nutrient needs related to acute illness as evidenced by estimated needs.  Ongoing  GOAL:   Patient will meet greater than or equal to 90% of their needs  Progrssing  MONITOR:   PO intake, Supplement acceptance, Labs, Weight trends, I & O's  REASON FOR ASSESSMENT:   Malnutrition Screening Tool    ASSESSMENT:   Pt with PMH of HTN and type II DM presents with bactermia d/t persistent MRSA.  11/19- s/p TEE, possible vegetations and endocarditis  Pt receiving nursing care at time of visit. Per chart review, pt continues to have a good appetite. Meal completion 95-100%. Pt is also accepting Glucerna supplements well.   Reviewed CSW notes, plan to d/c to SNF once insurance authorization is obtained.   Labs reviewed: CBGS: [id number] (inpatient orders for glycemic control are 0-15 units insulin aspart TID with meals, 20 units insulin aspart protamine-aspart BID).   Diet Order:  Diet heart healthy/carb modified Room service appropriate? Yes; Fluid consistency: Thin  EDUCATION NEEDS:   Education needs have been addressed  Skin:  Skin Assessment: Reviewed RN Assessment  Last BM:  02/05/17  Height:   Ht Readings from Last 1 Encounters:  02/01/17 5\' 4"  (1.626 m)    Weight:   Wt Readings from Last 1 Encounters:  02/01/17 145 lb 4.8 oz (65.9 kg)    Ideal Body Weight:  59 kg  BMI:  Body mass index is 24.94 kg/m.  Estimated Nutritional Needs:   Kcal:  1700-1900  Protein:  90-105 grams  Fluid:  >/= 1.7 L/d    Adrian Cross A. Jimmye Norman, RD, LDN, CDE Pager: [phone] After hours Pager: [phone]

## 2017-02-07 NOTE — Patient Outreach (Signed)
[facility]) Care Management  02/07/2017  VALERIE FREDIN 23-Aug-1945 [id number]   Update received from Marthenia Rolling, hospital liaison. Patient has been referred to [place] worker to follow when patient discharged to skilled nursing facility.   PLAN; No further follow up needed by [facility] at this time.  RNCM will refer patient to care management assistant to close to telephonic RNCM. RNCM will send patient primary MD notification of closure/   Quinn Plowman RN,BSN,CCM [facility] Telephonic  [phone]

## 2017-02-07 NOTE — Progress Notes (Signed)
Pharmacy Antibiotic Note Adrian Cross is a 71 y.o. male admitted on 02/01/2017 with disseminated MRSA bacteremia with cervical surgical site infection. Pharmacy following for vancomycin.   Vancomycin trough today of 13 is below desired range of 15-20 based on indication. SCr from 11/20 stable. Noted planned stop date of 03/28/17.   Plan: 1. Adjust vancomycin to 1250 mg IV every 12 hours 2. OPAT orders have been updated to reflect updated dose and stop date  3. SCr every 72 hours while inpatient      Height: 5\' 4"  (162.6 cm) Weight: 145 lb 4.8 oz (65.9 kg) IBW/kg (Calculated) : 59.2  Temp (24hrs), Avg:98.3 F (36.8 C), Min:97.7 F (36.5 C), Max:98.7 F (37.1 C)  Recent Labs  Lab 02/01/17 1926 02/02/17 0410 02/03/17 2108 02/05/17 0210 02/06/17 0319 02/07/17 1120  WBC 8.0 7.0  --   --  8.7  --   CREATININE 0.78 0.87  --  0.88 0.94  --   VANCOTROUGH  --   --  23*  --   --  13*    Estimated Creatinine Clearance: 62.1 mL/min (by C-G formula based on SCr of 0.94 mg/dL).    No Known Allergies  Antimicrobials this admission:  Vancomycin 11/8 >>   Rifampin 11/19 >>    Dose adjustments this admission: 11/11 trough = 14 (drawn early- suspected lower per discussion w/ Illene Regulus at [facility], on 1g q12, SCr  >> increased to 1250 q12) 11/13 trough =  15.5 (continued 1250 q12) 11/17 trough = 23 (lowered to 1000mg  q12) 11/21 trough = 13 (increase to 1250 q12)  Microbiology results: 11/15 BCx: ngF Blood ([facility]) 11/8 - 1/2 MRSA Cervical Wound 11/9 - MRSA Blood 11/11- 1/2 MRSA  Thank you for allowing pharmacy to be a part of this patient's care.  Vincenza Hews, PharmD, BCPS 02/07/2017, 1:19 PM   '

## 2017-02-07 NOTE — Progress Notes (Signed)
Pharmacy note - Antibiotics  Clarified antibiotic length of therapy with Dr. Baxter Flattery -   Vancomycin and rifampin should continue through 03/28/17.  Discharge orders updated.  Heide Guile, PharmD, BCPS-AQ [facility] Clinical Pharmacist Pager [phone]

## 2017-02-08 ENCOUNTER — Inpatient Hospital Stay (HOSPITAL_COMMUNITY): Payer: Medicare HMO

## 2017-02-08 DIAGNOSIS — M4642 Discitis, unspecified, cervical region: Secondary | ICD-10-CM | POA: Diagnosis not present

## 2017-02-08 DIAGNOSIS — R2681 Unsteadiness on feet: Secondary | ICD-10-CM | POA: Diagnosis not present

## 2017-02-08 DIAGNOSIS — M542 Cervicalgia: Secondary | ICD-10-CM | POA: Diagnosis not present

## 2017-02-08 DIAGNOSIS — E1140 Type 2 diabetes mellitus with diabetic neuropathy, unspecified: Secondary | ICD-10-CM | POA: Diagnosis not present

## 2017-02-08 DIAGNOSIS — Z792 Long term (current) use of antibiotics: Secondary | ICD-10-CM | POA: Diagnosis not present

## 2017-02-08 DIAGNOSIS — R609 Edema, unspecified: Secondary | ICD-10-CM

## 2017-02-08 DIAGNOSIS — I10 Essential (primary) hypertension: Secondary | ICD-10-CM | POA: Diagnosis not present

## 2017-02-08 DIAGNOSIS — K5900 Constipation, unspecified: Secondary | ICD-10-CM | POA: Diagnosis not present

## 2017-02-08 DIAGNOSIS — R7881 Bacteremia: Secondary | ICD-10-CM | POA: Diagnosis not present

## 2017-02-08 DIAGNOSIS — L0211 Cutaneous abscess of neck: Secondary | ICD-10-CM | POA: Diagnosis not present

## 2017-02-08 DIAGNOSIS — M6281 Muscle weakness (generalized): Secondary | ICD-10-CM | POA: Diagnosis not present

## 2017-02-08 DIAGNOSIS — Z794 Long term (current) use of insulin: Secondary | ICD-10-CM | POA: Diagnosis not present

## 2017-02-08 DIAGNOSIS — A4902 Methicillin resistant Staphylococcus aureus infection, unspecified site: Secondary | ICD-10-CM | POA: Diagnosis not present

## 2017-02-08 DIAGNOSIS — R278 Other lack of coordination: Secondary | ICD-10-CM | POA: Diagnosis not present

## 2017-02-08 DIAGNOSIS — M9921 Subluxation stenosis of neural canal of cervical region: Secondary | ICD-10-CM | POA: Diagnosis not present

## 2017-02-08 DIAGNOSIS — T8141XA Infection following a procedure, superficial incisional surgical site, initial encounter: Secondary | ICD-10-CM | POA: Diagnosis not present

## 2017-02-08 DIAGNOSIS — R41841 Cognitive communication deficit: Secondary | ICD-10-CM | POA: Diagnosis not present

## 2017-02-08 DIAGNOSIS — R498 Other voice and resonance disorders: Secondary | ICD-10-CM | POA: Diagnosis not present

## 2017-02-08 DIAGNOSIS — R1312 Dysphagia, oropharyngeal phase: Secondary | ICD-10-CM | POA: Diagnosis not present

## 2017-02-08 DIAGNOSIS — E118 Type 2 diabetes mellitus with unspecified complications: Secondary | ICD-10-CM | POA: Diagnosis not present

## 2017-02-08 DIAGNOSIS — R1084 Generalized abdominal pain: Secondary | ICD-10-CM | POA: Diagnosis not present

## 2017-02-08 LAB — CREATININE, SERUM
Creatinine, Ser: 0.88 mg/dL (ref 0.61–1.24)
GFR calc Af Amer: >60 mL/min (ref >60)
GFR calc non Af Amer: >60 mL/min (ref >60)

## 2017-02-08 LAB — GLUCOSE, CAPILLARY
GLUCOSE-CAPILLARY: 198 mg/dL — AB (ref 65–99)
Glucose-Capillary: 148 mg/dL — ABNORMAL HIGH (ref 65–99)

## 2017-02-08 MED ORDER — ENOXAPARIN SODIUM 100 MG/ML ~~LOC~~ SOLN: 100.0000 mg | SUBCUTANEOUS | Status: AC

## 2017-02-08 MED ORDER — ENOXAPARIN SODIUM 100 MG/ML ~~LOC~~ SOLN
100.0000 mg | SUBCUTANEOUS | Status: DC
  Administered 2017-02-08: 100 mg via SUBCUTANEOUS
  Filled 2017-02-08: qty 1

## 2017-02-08 MED ORDER — SODIUM CHLORIDE 0.9% FLUSH
10.0000 mL | INTRAVENOUS | Status: DC | PRN
  Administered 2017-02-08 (×2): 10 mL
  Filled 2017-02-08 (×2): qty 40

## 2017-02-08 MED ORDER — VANCOMYCIN IV (FOR PTA / DISCHARGE USE ONLY): 1250.0000 mg | Freq: Two times a day (BID) | INTRAVENOUS | Status: AC

## 2017-02-08 MED ORDER — ELIQUIS 5 MG VTE STARTER PACK: ORAL_TABLET | ORAL | 0 refills | Status: DC

## 2017-02-08 MED ORDER — HEPARIN SOD (PORK) LOCK FLUSH 100 UNIT/ML IV SOLN
250.0000 [IU] | INTRAVENOUS | Status: AC | PRN
  Administered 2017-02-08: 250 [IU]

## 2017-02-08 NOTE — Progress Notes (Signed)
Clinical Social Worker facilitated patient discharge including contacting patient family and facility to confirm patient discharge plans.  Clinical information faxed to facility and family agreeable with plan.  CSW arranged ambulance transport via [facility] to [facility] .  RN to call [phone] ext [phone] (pt will go in room 2206) for report prior to discharge.  Clinical Social Worker will sign off for now as social work intervention is no longer needed. Please consult [place] again if new need arises.  Rhea Pink, MSW, [place]

## 2017-02-08 NOTE — Progress Notes (Signed)
Peripherally Inserted Central Catheter/Midline Placement  The IV Nurse has discussed with the patient and/or persons authorized to consent for the patient, the purpose of this procedure and the potential benefits and risks involved with this procedure.  The benefits include less needle sticks, lab draws from the catheter, and the patient may be discharged home with the catheter. Risks include, but not limited to, infection, bleeding, blood clot (thrombus formation), and puncture of an artery; nerve damage and irregular heartbeat and possibility to perform a PICC exchange if needed/ordered by physician.  Alternatives to this procedure were also discussed.  Bard Power PICC patient education guide, fact sheet on infection prevention and patient information card has been provided to patient /or left at bedside.    PICC/Midline Placement Documentation  PICC Single Lumen 02/08/17 PICC Right Brachial 38 cm 0 cm (Active)  Indication for Insertion or Continuance of Line Home intravenous therapies (PICC only) 02/08/2017 12:05 AM  Exposed Catheter (cm) 0 cm 02/08/2017 12:05 AM  Site Assessment Clean;Dry;Intact 02/08/2017 12:05 AM  Line Status Blood return noted;Flushed;Saline locked 02/08/2017 12:05 AM  Dressing Type Transparent 02/08/2017 12:05 AM  Dressing Status Clean;Dry;Intact;Antimicrobial disc in place 02/08/2017 12:05 AM  Dressing Intervention New dressing 02/08/2017 12:05 AM  Dressing Change Due 02/15/17 02/08/2017 12:05 AM       Aldona Lento L 02/08/2017, 12:18 AM

## 2017-02-08 NOTE — Progress Notes (Signed)
Adrian Cross to be D/C'd Skilled nursing facility per MD order.  Discussed with the patient and all questions fully answered.  VSS, IV catheter discontinued intact. Site without signs and symptoms of complications. Dressing and pressure applied. Pt has a single lumen PICC in right upper arm.  An After Visit Summary was printed and given to the patient. Patient received prescription.  Report called to Essence, Therapist, sports at [facility]. All questions answered.  Patient D/'d via Corey Harold.  Christoper Fabian Jamaiyah Pyle 02/08/2017 1:22 PM

## 2017-02-08 NOTE — Discharge Summary (Addendum)
Physician Discharge Summary  Adrian Cross  ZOX:[id number]  DOB: [birth year]  DOA: 02/01/2017 PCP: Iona Beard, MD  Admit date: 02/01/2017 Discharge date: 02/08/2017  Admitted From: Home Disposition:  SNF   Recommendations for Outpatient Follow-up:  1. Follow up with PCP in 1 week  2. Remove neck sutures 02/09/17 3. Follow up with ID in 4 weeks  4. Lovenox for 45 days  5. Vancomycin and Rifampin for 8 weeks  6. Please obtain BMP/CBC in one week to monitor renal function and hgb  7. Check Vanco trough weekly 15-20  8. Follow up with Dr Carloyn Manner in 2-4 weeks   Discharge Condition: Stable  CODE STATUS: Full code  Diet recommendation: Heart Healthy   Brief/Interim Summary: For full details see H&P/progress note but in brief, Adrian Cross is a 71 year old male with hypertension, diabetes type 2, recent laminectomy, fusion with hardware placement done at [facility], then presented to [facility] hospital with altered mental status and was found to have MRSA bacteremia.  Patient was found to have infection at the surgical site/cervical spine which was debrided by neurosurgery, per outside record hardware was not infected. Patient continued to have persistently blood cultures positive for MRSA and was transferred to [facility] for TEE, which showed possible vegetation, concerning of endocarditis. ID was consulted and recommended IV vancomycin for 8 weeks and oral rifampin. Patient developed R forearm swelling, doppler US showed R superficial vein thrombosis. Patient was stared on Eliquis. Patient was evaluated by PT who recommended SNF for short term rehab.  Subjective: Patient seen and examined, continue to do well, R arm swollen and tenderness, persistent. No other concerns. No acute events overnight. Remains afebrile.   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 Patient underwent I&D 11/9 (back abscess) at outside hospital with cultures showing MRSA from cervical wound. Per outside report hardware was not infected.  Patient had PICC line which was removed PTA  Repeat CT showed persistent 4.8 x 1.1 x 6.8 cm gas and fluid filled paraspinal collection along the posterior surgical approach - Case discussed with IR - recommended Neurosurgical evaluation whom recommended to continue abx no wash out, if intervention needed, will have to done by original surgeon, Dr Carloyn Manner  TEE showed possible vegetation with high mobile structure attached to the end of the eustachian valve - may represent endocarditis   ID recommendations appreciated - will continue IV vancomycin for 8 weeks plus oral rifampin 300 mg BID  PICC line  Neck suture removal on 11/23  Repeated blood cultures negative so far  Follow up with ID clinic in 4-6 weeks   HTN  BP remains stable  Continue Lisinopril  Follow up with PCP   Superficial vein thrombosis (acute)  R cephalic vein in the r forearm per prelim report  Will start Lovenox for at least 45 days - given patient will have a PICC line, this increase the risk of more clot formation. Re evaluate in 45 days, if symptoms resolved, may d/c anticoagulation. Follow up as outpatient  Addendum after reviewing doppler US it also shows DVT, management remains the same.   DM type 2 with neuropathy - CBG's stable, poorly controlled with hyperglycemia  A1C, 12.3 Continue - Insulin 70/30 20 units Continue Novolog sliding scale  Continue gabapentin  Will need adjustment as outpatient   Tobacco abuse  Nicotine patch . Counseling   All other chronic medical condition were stable during the hospitalization.  Patient was seen by physical therapy, recommending SNF  On the day of the discharge the patient's vitals were stable, and no other acute medical condition were reported by patient. the patient was felt  safe to be discharge to SNF   Discharge Instructions  You were cared for by a hospitalist during your hospital stay. If you have any questions about your discharge medications or the care you received while you were in the hospital after you are discharged, you can call the unit and asked to speak with the hospitalist on call if the hospitalist that took care of you is not available. Once you are discharged, your primary care physician will handle any further medical issues. Please note that NO REFILLS for any discharge medications will be authorized once you are discharged, as it is imperative that you return to your primary care physician (or establish a relationship with a primary care physician if you do not have one) for your aftercare needs so that they can reassess your need for medications and monitor your lab values.  Discharge Instructions    AMB Referral to [place] Management   Complete by:  As directed    Please assign [employer] member to [facility] LCSW due to high risk for readmission. To discharge to [facility]. Currently at [facility]. Written consent obtained. Daughter- Adrian Cross- [phone] be contacted for post discharge calls. Please call with questions. Thanks. Marthenia Rolling, [place], RN,BSN-[facility] Liaison-[phone]   Reason for consult:  Please assign to [facility] LCSW   Expected date of contact:  1-3 days (reserved for hospital discharges)   Call MD for:  difficulty breathing, headache or visual disturbances   Complete by:  As directed    Call MD for:  extreme fatigue   Complete by:  As directed    Call MD for:  hives   Complete by:  As directed    Call MD for:  persistant dizziness or light-headedness   Complete by:  As directed    Call MD for:  persistant nausea and vomiting   Complete by:  As directed    Call MD for:  redness, tenderness, or signs of infection (pain, swelling, redness, odor or green/yellow discharge around incision site)   Complete  by:  As directed    Call MD for:  severe uncontrolled pain   Complete by:  As directed    Call MD for:  temperature >100.4   Complete by:  As directed    Diet - low sodium heart healthy   Complete by:  As directed    Home infusion instructions [facility] May follow [place] Dosing Protocol; May administer Cathflo as needed to maintain patency of vascular access device.; Flushing of vascular access device: per [facility] Protocol: 0.9% NaCl pre/post medica...   Complete by:  As directed    Instructions:  May follow [place] Dosing Protocol   Instructions:  May administer Cathflo as needed to maintain patency of vascular access device.   Instructions:  Flushing of vascular access device: per [facility] Protocol: 0.9% NaCl pre/post medication administration and prn patency; Heparin 100 u/ml, 37m for implanted ports and Heparin 10u/ml, 559mfor all other central venous catheters.   Instructions:  May follow [facility] Anaphylaxis Protocol for First Dose Administration in the home: 0.9% NaCl at 25-50 ml/hr to maintain IV access for protocol meds. Epinephrine 0.3 ml  IV/IM PRN and Benadryl 25-50 IV/IM PRN s/s of anaphylaxis.   Instructions:  [place] [facility] Coordinator (RN) to assist per patient IV care needs in the home PRN.   Home infusion instructions [facility] May follow [place] Dosing Protocol; May administer Cathflo as needed to maintain patency of vascular access device.; Flushing of vascular access device: per [facility] Protocol: 0.9% NaCl pre/post medica...   Complete by:  As directed    Instructions:  May follow [place] Dosing Protocol   Instructions:  May administer Cathflo as needed to maintain patency of vascular access device.   Instructions:  Flushing of vascular access device: per [facility] Protocol: 0.9% NaCl pre/post medication administration and prn patency; Heparin 100 u/ml, 80m for implanted ports and Heparin 10u/ml, 55mfor all other central venous catheters.    Instructions:  May follow [facility] Anaphylaxis Protocol for First Dose Administration in the home: 0.9% NaCl at 25-50 ml/hr to maintain IV access for protocol meds. Epinephrine 0.3 ml IV/IM PRN and Benadryl 25-50 IV/IM PRN s/s of anaphylaxis.   Instructions:  [facility] Coordinator (RN) to assist per patient IV care needs in the home PRN.   Increase activity slowly   Complete by:  As directed      Allergies as of 02/08/2017   No Known Allergies     Medication List    STOP taking these medications   glyBURIDE 5 MG tablet Commonly known as:  DIABETA   Insulin Lispro Prot & Lispro (75-25) 100 UNIT/ML Kwikpen Commonly known as:  HUMALOG MIX 75/25 KWIKPEN     TAKE these medications   acetaminophen 325 MG tablet Commonly known as:  TYLENOL Take 2 tablets (650 mg total) by mouth every 6 (six) hours as needed for mild pain (or Fever >/= 101).   enoxaparin 100 MG/ML injection Commonly known as:  LOVENOX Inject 1 mL (100 mg total) into the skin daily.   feeding supplement (GLUCERNA SHAKE) Liqd Take 237 mLs by mouth 2 (two) times daily between meals.   gabapentin 300 MG capsule Commonly known as:  NEURONTIN Take 300 mg by mouth 2 (two) times daily.   insulin aspart 100 UNIT/ML injection Commonly known as:  novoLOG Inject 0-9 Units into the skin 3 (three) times daily with meals. Sliding scale CBG 70 - 120: 0 units CBG 121 - 150: 1 unit,  CBG 151 - 200: 2 units,  CBG 201 - 250: 3 units,  CBG 251 - 300: 5 units,  CBG 301 - 350: 7 units,  CBG 351 - 400: 9 units   CBG > 400: 9 units and notify your MD   insulin aspart protamine- aspart (70-30) 100 UNIT/ML injection Commonly known as:  NOVOLOG MIX 70/30 Inject 0.2 mLs (20 Units total) into the skin 2 (two) times daily with a meal.   lisinopril 40 MG tablet Commonly known as:  PRINIVIL,ZESTRIL Take 40 mg by mouth daily.   methocarbamol 500 MG tablet Commonly known as:  ROBAXIN Take 1 tablet (500 mg total) by mouth every 6  (six) hours as needed for muscle spasms.   nicotine 14 mg/24hr patch Commonly known as:  NICODERM CQ - dosed in mg/24 hours Place 1 patch (14 mg total) onto the skin daily.   pantoprazole 40 MG tablet Commonly known as:  PROTONIX Take 1 tablet (40 mg total) by mouth 2 (two) times daily.   polyethylene glycol packet Commonly known as:  MIRALAX / GLYCOLAX Take 17 g by mouth  daily as needed for moderate constipation.   rifampin 300 MG capsule Commonly known as:  RIFADIN Take 1 capsule (300 mg total) by mouth 2 (two) times daily. End date is 03/28/17   senna-docusate 8.6-50 MG tablet Commonly known as:  Senokot-S Take 1 tablet by mouth 2 (two) times daily.   traMADol 50 MG tablet Commonly known as:  ULTRAM Take 1 tablet (50 mg total) by mouth every 6 (six) hours as needed for moderate pain or severe pain.   vancomycin IVPB Inject 1,250 mg into the vein every 12 (twelve) hours. Indication:  MRSA discitis  Last Day of Therapy: 03/28/17 Labs - _0 /20/18 1229      Contact information for follow-up providers    Iona Beard, MD. Schedule an appointment as soon as possible for a visit in 2 week(s).   Specialty:  Family Medicine Contact information: [address]  [place] [address] [phone]        Tommy Medal, Lavell Islam, MD. Schedule an appointment as soon as possible for a visit in 6 week(s).   Specialty:  Infectious Diseases Contact information: [address] [place] [address] [phone]  Glenna Fellows, MD. Schedule an appointment as soon as possible for a  visit in 2 week(s).   Specialty:  Neurosurgery Contact information: [place] [place] [address] [phone]            Contact information for after-discharge care    Destination    [facility] Follow up.   Service:  Skilled Nursing Contact information: [place] [phone]                 No Known Allergies  Consultations:  ID   Neurosurgery    Procedures/Studies: Ct Soft Tissue Neck W Contrast  Result Date: 02/02/2017 CLINICAL DATA:  Initial evaluation for bacteremia, back pain, infection of the neck at site of incision. EXAM: CT NECK WITH CONTRAST TECHNIQUE: Multidetector CT imaging of the neck was performed using the standard protocol following the bolus administration of intravenous contrast. CONTRAST:  <See Chart> ISOVUE-300 IOPAMIDOL (ISOVUE-300) INJECTION 61% COMPARISON:  Prior CT from 01/23/2017. FINDINGS: Pharynx and larynx: Oral cavity within normal limits. Patient is edentulous. Palatine tonsils symmetric and within normal limits. Calcified tonsilliths noted. Remainder of the oropharynx and nasopharynx within normal limits. Retropharyngeal soft tissues within normal limits. Epiglottis normal. Vallecula clear. Remainder of the hypopharynx and supraglottic larynx normal. Subglottic airway clear. Salivary glands: Unremarkable. Thyroid: Unremarkable. Lymph nodes: No pathologically enlarged lymph nodes identified within the neck. Vascular: Aortic atherosclerosis with prominent carotid bifurcation calcifications. Carotid siphon calcifications noted as well. Limited intracranial: Unremarkable. Visualized orbits: Visualized globes and orbital soft tissues within normal limits. Mastoids and visualized paranasal sinuses: Chronic left sphenoid sinusitis. Mastoids and middle ear cavities are clear. Skeleton: Postoperative changes from prior laminectomies at C3 through C5, with posterior instrumentation at C3  through C6. Hardware well positioned without complication. Multilevel degenerative spondylolysis again noted Upper chest: Visualized upper mediastinum within normal limits. Severe centrilobular and bullous emphysema. Other: Irregular gas and fluid collection along the mid midline posterior incision measures 4.8 x 1.1 x 6.8 cm (AP by transverse by craniocaudad). This lies at the level of C3 through C7. Mildly thickened rim enhancement about this collection as compared to previous adjacent paraspinous muscular edema slightly more prominent as compared to previous exam. IMPRESSION: 1. Persistent 4.8 x 1.1 x 6.8 cm gas and fluid containing paraspinal collection along the posterior surgical approach. Again, finding may reflect persistent postoperative seroma or possibly abscess. Percutaneous sampling recommended if concern for infection. 2. Status post C3 through C5 laminectomy with posterior instrumentation at C3 through C6. 3. Atherosclerosis with severe emphysema. Electronically Signed   By: Jeannine Boga M.D.   On: 02/02/2017 01:17   TEE 02/05/17 ------------------------------------------------------------------- Study Conclusions  - Left ventricle: The cavity size was normal. Wall thickness was   normal. Systolic function was normal. The estimated ejection   fraction was in the range of 55% to 60%. - Aortic valve: No evidence of vegetation. - Mitral valve: No evidence of vegetation. - Left atrium: No evidence of thrombus in the appendage. - Right atrium: There is a highly mobile echo density (1.2 x 0.8cm   at thickest distal portion) that originates at the eustacian   valve (fetal remnant at location of SVC to RA junction). This   mass may represent vegetation given his setting of bacteremia,   however normal variant can not be excluded. No evidence of   thrombus in the atrial cavity or appendage. - Tricuspid valve: No evidence of vegetation. - Pulmonic valve: No evidence  of  vegetation.    Discharge Exam: Vitals:   02/08/17 0500 02/08/17 0932  BP: 113/68 102/69  Pulse: 79   Resp: 20   Temp: 98.7 F (37.1 C)   SpO2: 97%    Vitals:   02/07/17 2305 02/08/17 0300 02/08/17 0500 02/08/17 0932  BP: 123/67  113/68 102/69  Pulse: 93  79   Resp: (!) 24  20   Temp: 99.4 F (37.4 C)  98.7 F (37.1 C)   TempSrc: Oral  Oral   SpO2: 99%  97%   Weight:  66.2 kg (145 lb 15.1 oz)    Height:        General: NAD Cardiovascular: RRR, S1/S2 +, no rubs, no gallops Respiratory: CTA bilaterally, no wheezing, no rhonchi Abdominal: Soft, NT, ND, bowel sounds Extremities: R forearm erythema and tenderness, PICC line in place   The results of significant diagnostics from this hospitalization (including imaging, microbiology, ancillary and laboratory) are listed below for reference.     Microbiology: Recent Results (from the past 240 hour(s))  Culture, blood (Routine X 2) w Reflex to ID Panel     Status: None   Collection Time: 02/01/17  7:21 PM  Result Value Ref Range Status   Specimen Description BLOOD LEFT ARM  Final   Special Requests IN PEDIATRIC BOTTLE Blood Culture adequate volume  Final   Culture NO GROWTH 5 DAYS  Final   Report Status 02/06/2017 FINAL  Final  Culture, blood (Routine X 2) w Reflex to ID Panel     Status: None   Collection Time: 02/01/17  7:26 PM  Result Value Ref Range Status   Specimen Description BLOOD LEFT ARM  Final   Special Requests IN PEDIATRIC BOTTLE Blood Culture adequate volume  Final   Culture NO GROWTH 5 DAYS  Final   Report Status 02/06/2017 FINAL  Final     Labs: BNP (last 3 results) No results for input(s): BNP in the last 8760 hours. Basic Metabolic Panel: Recent Labs  Lab 02/01/17 1926 02/02/17 0410 02/05/17 0210 02/06/17 0319 02/08/17 0405  NA 135 135 136 137  --   K 3.5 3.9 4.1 3.8  --   CL 103 104 105 103  --   CO2 _0 --   GLUCOSE 157* 236* 275* 202*  --   BUN _1 --   CREATININE 0.78  0.87 0.88 0.94 0.88  CALCIUM 9.6 9.4 9.6 9.6  --    Liver Function Tests: Recent Labs  Lab 02/01/17 1926  AST 18  ALT 24  ALKPHOS 66  BILITOT 0.5  PROT 6.9  ALBUMIN 2.6*   No results for input(s): LIPASE, AMYLASE in the last 168 hours. No results for input(s): AMMONIA in the last 168 hours. CBC: Recent Labs  Lab 02/01/17 1926 02/02/17 0410 02/06/17 0319  WBC 8.0 7.0 8.7  NEUTROABS 4.7  --   --   HGB 10.6* 10.0* 10.0*  HCT 32.1* 30.8* 30.8*  MCV 90.9 91.4 93.6  PLT 228 226 303   Cardiac Enzymes: No results for input(s): CKTOTAL, CKMB, CKMBINDEX, TROPONINI in the last 168 hours. BNP: Invalid input(s): POCBNP CBG: Recent Labs  Lab 02/07/17 1155 02/07/17 1710 02/07/17 2302 02/08/17 0838 02/08/17 1208  GLUCAP 222* 215* 79 148* 198*   D-Dimer No results for input(s): DDIMER in the last 72 hours. Hgb A1c No results for input(s): HGBA1C in the last 72 hours. Lipid Profile No results for input(s): CHOL, HDL,  LDLCALC, TRIG, CHOLHDL, LDLDIRECT in the last 72 hours. Thyroid function studies No results for input(s): TSH, T4TOTAL, T3FREE, THYROIDAB in the last 72 hours.  Invalid input(s): FREET3 Anemia work up No results for input(s): VITAMINB12, FOLATE, FERRITIN, TIBC, IRON, RETICCTPCT in the last 72 hours. Urinalysis    Component Value Date/Time   COLORURINE STRAW (A) 11/29/2016 0933   APPEARANCEUR CLEAR 11/29/2016 0933   LABSPEC 1.023 11/29/2016 0933   PHURINE 8.0 11/29/2016 0933   GLUCOSEU >=500 (A) 11/29/2016 0933   HGBUR NEGATIVE 11/29/2016 0933   BILIRUBINUR NEGATIVE 11/29/2016 0933   KETONESUR 20 (A) 11/29/2016 0933   PROTEINUR NEGATIVE 11/29/2016 0933   NITRITE NEGATIVE 11/29/2016 0933   LEUKOCYTESUR NEGATIVE 11/29/2016 0933   Sepsis Labs Invalid input(s): PROCALCITONIN,  WBC,  LACTICIDVEN Microbiology Recent Results (from the past 240 hour(s))  Culture, blood (Routine X 2) w Reflex to ID Panel     Status: None   Collection Time: 02/01/17  7:21 PM   Result Value Ref Range Status   Specimen Description BLOOD LEFT ARM  Final   Special Requests IN PEDIATRIC BOTTLE Blood Culture adequate volume  Final   Culture NO GROWTH 5 DAYS  Final   Report Status 02/06/2017 FINAL  Final  Culture, blood (Routine X 2) w Reflex to ID Panel     Status: None   Collection Time: 02/01/17  7:26 PM  Result Value Ref Range Status   Specimen Description BLOOD LEFT ARM  Final   Special Requests IN PEDIATRIC BOTTLE Blood Culture adequate volume  Final   Culture NO GROWTH 5 DAYS  Final   Report Status 02/06/2017 FINAL  Final    Time coordinating discharge: 35 minutes  SIGNED:  Chipper Oman, MD  [facility] 02/08/2017, 12:33 PM  Pager please text page via  [email or web address]

## 2017-02-08 NOTE — Progress Notes (Signed)
VASCULAR LAB PRELIMINARY  PRELIMINARY  PRELIMINARY  PRELIMINARY  Right upper extremity venous duplex completed.    Preliminary report:  There is non occlusive, acute DVT forming in the brachial vein, upper arm.  There is acute superficial thrombosis noted in the cephalic vein in the right forearm.   Gave report to Ellard Artis, RN  Itzayanna Kaster, RVT 02/08/2017, 10:06 AM

## 2017-02-12 ENCOUNTER — Encounter: Payer: Self-pay | Admitting: *Deleted

## 2017-02-12 DIAGNOSIS — M4642 Discitis, unspecified, cervical region: Secondary | ICD-10-CM | POA: Diagnosis not present

## 2017-02-12 DIAGNOSIS — E1140 Type 2 diabetes mellitus with diabetic neuropathy, unspecified: Secondary | ICD-10-CM | POA: Diagnosis not present

## 2017-02-12 DIAGNOSIS — R7881 Bacteremia: Secondary | ICD-10-CM | POA: Diagnosis not present

## 2017-02-12 DIAGNOSIS — I10 Essential (primary) hypertension: Secondary | ICD-10-CM | POA: Diagnosis not present

## 2017-02-14 DIAGNOSIS — E1140 Type 2 diabetes mellitus with diabetic neuropathy, unspecified: Secondary | ICD-10-CM | POA: Diagnosis not present

## 2017-02-14 DIAGNOSIS — K5900 Constipation, unspecified: Secondary | ICD-10-CM | POA: Diagnosis not present

## 2017-02-16 DIAGNOSIS — E1140 Type 2 diabetes mellitus with diabetic neuropathy, unspecified: Secondary | ICD-10-CM | POA: Diagnosis not present

## 2017-02-16 DIAGNOSIS — R7881 Bacteremia: Secondary | ICD-10-CM | POA: Diagnosis not present

## 2017-02-19 ENCOUNTER — Other Ambulatory Visit: Payer: Self-pay | Admitting: *Deleted

## 2017-02-19 ENCOUNTER — Encounter: Payer: Self-pay | Admitting: *Deleted

## 2017-02-19 NOTE — Patient Outreach (Signed)
[facility]) Care Management  02/19/2017  Adrian Cross 10/14/1945 [id number]   CSW was able to make initial contact with patient today to perform the assessment, as well as assess and assist with social work needs and services, when [place] met with patient at [facility] [place] where patient currently resides to receive short-term rehabilitative services.  CSW introduced self, explained role and types of services provided through [place] [facility] ([place] [facility]).  CSW further explained to patient that CSW works with patient's Telephonic RNCM, also with [place] [facility], Quinn Plowman. CSW then explained the reason for the visit, indicating that Mrs. Adrian Cross thought that patient would benefit from social work services and resources to assist with discharge planning needs from the skilled nursing facility.  CSW obtained two HIPAA compliant identifiers from patient, which included patient's name and date of birth. Patient admits that he was unable to walk prior to going to [facility], but now he is actually able to take a few steps with the assistance of a rolling walker.  Patient hopes to get stronger so that he can return home to live independently at time of discharge.  Patient reported that his daughter, Adrian Cross would like for patient to come and stay with her for a period of time in [place], [place], but patient is pretty adamant about returning home to live.  CSW explained to patient that it is certainly nice to have that option, in the event that he needs to live with Mrs. Adrian Cross for a brief period of time.  Patient believes that he will be at [facility] for an additional 6 weeks.  CSW was unable to meet with the social worker or physical therapist at [facility] to confirm.   Patient is agreeable to having home health services and/or durable medical equipment arranged for him in the home,  if recommended.  Patient will decide on agencies of choice, based on the approved provider list given to him by CSW.  Patient reports already having the following equipment: Hearing aids, eyeglasses, CBG Meters, Dentures, Bedside Commode/3-in-1, Hospital Bed, Grab Bars in [place], Education officer, environmental Around Clinical cytogeneticist, Ship broker and a Civil engineer, contracting with [employer].  CSW will coordinate patient's discharge planning arrangements with the social worker at [facility].  CSW will follow-up with patient again in two weeks to assess and assist with discharge planning needs and services. THN CM Care Plan Problem One     Most Recent Value  Care Plan Problem One  Level of care issues.  Role Documenting the Problem One  Clinical Social Worker  Care Plan for Problem One  Active  [facility] Long Term Goal   Patient will have home care services and durable medical equipment in place, upon discharge from [facility], within the next six weeks.  THN Long Term Goal Start Date  02/19/17  Interventions for Problem One Long Term Goal  CSW will assist patient and discharge planning coordinator at [facility] with arranging home health services and durable medical equipment for patient in the home.  THN CM Short Term Goal #1   Patient will decide on a home health agency of choice, with regards to home care services, within the next three weeks.  THN CM Short Term Goal #1 Start Date  02/19/17  Interventions for Short Term Goal #1  CSW has provided patient with a list of agencies offering home care services.  THN CM  Short Term Goal #2   Patient will decide on an agency of choice, with regards to durable medical equipment, within the next three weeks.  THN CM Short Term Goal #2 Start Date  02/19/17  Interventions for Short Term Goal #2  CSW has provided patient with a list of agencies offering durable medical equipment.     Nat Christen, BSW, MSW, LCSW  Licensed Education officer, environmental  Health System  [place]ailing [place] [address] [place] [address] Physical Address-[address], [place], [place] [address] Toll Free Main # [phone] Fax # [phone] Cell # [phone]  Office # [phone] [email or web address]

## 2017-03-05 ENCOUNTER — Other Ambulatory Visit: Payer: Self-pay | Admitting: *Deleted

## 2017-03-05 DIAGNOSIS — M542 Cervicalgia: Secondary | ICD-10-CM | POA: Diagnosis not present

## 2017-03-05 NOTE — Patient Outreach (Signed)
[facility]) Care Management  03/05/2017  Adrian Cross 03/29/1945 [id number]   CSW was able to meet with patient at [facility] where patient currently resides to receive short-term rehabilitative services, to perform a routine visit today.  Patient appeared to be in good spirits, up walking around his room with the assistance of a rolling walker.  Patient reported that his goal is to be able to walk with a cane.  Patient was getting ready for therapies, both physical and occupational.  Patient indicated that he is not aware of a tentative discharge date, as of yet.  CSW will continue to follow along and make arrangements to meet with patient again on Friday, March 16, 2017 to assess and assist with discharge planning needs and services. THN CM Care Plan Problem One     Most Recent Value  Care Plan Problem One  Level of care issues.  Role Documenting the Problem One  Clinical Social Worker  Care Plan for Problem One  Active  [facility] Long Term Goal   Patient will have home care services and durable medical equipment in place, upon discharge from [facility], within the next six weeks.  THN Long Term Goal Start Date  02/19/17  Interventions for Problem One Long Term Goal  CSW will assist patient and discharge planning coordinator at [facility] with arranging home health services and durable medical equipment for patient in the home.  THN CM Short Term Goal #1   Patient will decide on a home health agency of choice, with regards to home care services, within the next three weeks.  THN CM Short Term Goal #1 Start Date  02/19/17  [facility] CM Short Term Goal #1 Met Date  03/05/17  Interventions for Short Term Goal #1  CSW has provided patient with a list of agencies offering home care services.  THN CM Short Term Goal #2   Patient will decide on an agency of choice, with regards to durable medical equipment, within the next three weeks.  THN CM Short Term  Goal #2 Start Date  02/19/17  Interventions for Short Term Goal #2  CSW has provided patient with a list of agencies offering durable medical equipment.    Adrian Cross, BSW, MSW, LCSW  Licensed Education officer, environmental Health System  Mailing [place] [address] [place], [place] [address] Physical Address-[address], [place], [place] [address] Toll Free Main # [phone] Fax # [phone] Cell # [phone]  Office # [phone] [email or web address]

## 2017-03-09 DIAGNOSIS — E1140 Type 2 diabetes mellitus with diabetic neuropathy, unspecified: Secondary | ICD-10-CM | POA: Diagnosis not present

## 2017-03-09 DIAGNOSIS — R7881 Bacteremia: Secondary | ICD-10-CM | POA: Diagnosis not present

## 2017-03-16 ENCOUNTER — Other Ambulatory Visit: Payer: Self-pay | Admitting: *Deleted

## 2017-03-16 NOTE — Patient Outreach (Signed)
[place] [facility]) Care Management  03/16/2017  MEL LANGAN Apr 22, 1945 [id number]   CSW was able to meet with patient at [facility] today, [place] where patient currently resides to receive short-term rehabilitative services, to perform a routine visit.  Patient was not very talkative today, reporting that he did not sleep well last evening.  Patient was actually taking a nap at the time of CSW's arrival.  CSW inquired about patient's plan of care when leaving the skilled nursing facility.  Patient denied having a plan in place, reporting that he is depending on his daughter to make all the decisions for him.  Patient reported, "my daughter says we'll figure it out when it comes closer to the time".  Patient went on to say that his current plan is to go and live with his daughter, Camie Patience in [place].  CSW agreed to follow-up with patient again in two weeks, as patient does not currently have a tentative discharge date scheduled. THN CM Care Plan Problem One     Most Recent Value  Care Plan Problem One  Level of care issues.  Role Documenting the Problem One  Clinical Social Worker  Care Plan for Problem One  Active  [facility] Long Term Goal   Patient will have home care services and durable medical equipment in place, upon discharge from [facility], within the next six weeks.  THN Long Term Goal Start Date  02/19/17  Interventions for Problem One Long Term Goal  CSW will assist patient and discharge planning coordinator at [facility] with arranging home health services and durable medical equipment for patient in the home.  THN CM Short Term Goal #1   Patient will decide on a home health agency of choice, with regards to home care services, within the next three weeks.  THN CM Short Term Goal #1 Start Date  02/19/17  [facility] CM Short Term Goal #1 Met Date  03/05/17  Interventions for Short Term Goal #1  CSW has provided patient  with a list of agencies offering home care services.  THN CM Short Term Goal #2   Patient will decide on an agency of choice, with regards to durable medical equipment, within the next three weeks.  THN CM Short Term Goal #2 Start Date  02/19/17  [facility] CM Short Term Goal #2 Met Date  03/16/17  Interventions for Short Term Goal #2  CSW has provided patient with a list of agencies offering durable medical equipment.     Nat Christen, BSW, MSW, LCSW  Licensed Education officer, environmental Health System  Mailing [place] [address] [place], [place] [address] Physical Address-[address], [place], [place] [address] Toll Free Main # [phone] Fax # [phone] Cell # [phone]  Office # [phone] [email or web address]

## 2017-03-22 ENCOUNTER — Inpatient Hospital Stay: Payer: Medicare HMO | Admitting: Internal Medicine

## 2017-03-30 ENCOUNTER — Other Ambulatory Visit: Payer: Self-pay | Admitting: *Deleted

## 2017-03-30 NOTE — Patient Outreach (Signed)
[facility]) Care Management  03/30/2017  JEMARCUS DOUGAL [birth year] [id number]   CSW was able to meet with patient at [facility] today, [place] where patient currently resides to receive short-term rehabilitative services, to perform a routine visit.  Patient was up and ambulating around his room with his rolling walker at the time of CSW's arrival.  CSW commented on how it was wonderful to see patient out of his bed and able to ambulate with minimal assistance.  Patient appeared to be in good spirits, hopeful that he will be able to return home soon.  Patient does not have a tentative discharge date scheduled as of yet, according to his attending nurse at [facility].  Patient is unsure as to whether he will stay with his daughter at time of discharge or whether he will return to his own home to live independently.  CSW agreed to follow-up with patient again in two weeks to check progress, as well as assess and assist with discharge planning needs and services. THN CM Care Plan Problem One     Most Recent Value  Care Plan Problem One  Level of care issues.  Role Documenting the Problem One  Clinical Social Worker  Care Plan for Problem One  Active  [facility] Long Term Goal   Patient will have home care services and durable medical equipment in place, upon discharge from [facility], within the next six weeks.  THN Long Term Goal Start Date  02/19/17  Interventions for Problem One Long Term Goal  CSW will assist patient and discharge planning coordinator at [facility] with arranging home health services and durable medical equipment for patient in the home.  THN CM Short Term Goal #1   Patient will decide on a home health agency of choice, with regards to home care services, within the next three weeks.  THN CM Short Term Goal #1 Start Date  02/19/17  [facility] CM Short Term Goal #1 Met Date  03/05/17  Interventions for Short Term Goal #1   CSW has provided patient with a list of agencies offering home care services.  THN CM Short Term Goal #2   Patient will decide on an agency of choice, with regards to durable medical equipment, within the next three weeks.  THN CM Short Term Goal #2 Start Date  02/19/17  [facility] CM Short Term Goal #2 Met Date  03/16/17  Interventions for Short Term Goal #2  CSW has provided patient with a list of agencies offering durable medical equipment.    Nat Christen, BSW, MSW, LCSW  Licensed Education officer, environmental Health System  Mailing [place] [address], [place], [place] [address] Physical Address-[address], [place], [place] [address] Toll Free Main # [phone] Fax # [phone] Cell # [phone]  Office # [phone] [email or web address]

## 2017-04-13 ENCOUNTER — Other Ambulatory Visit: Payer: Self-pay | Admitting: *Deleted

## 2017-04-14 NOTE — Patient Outreach (Signed)
[facility]) Care Management  04/14/2017  BOYSIE BONEBRAKE [birth year] [id number]   CSW attempted to reach patient by phone and left message for SNF rep to get updates on his progress at SNF rehab and dc plans. CSW will call again next week for updates.   Eduard Clos, MSW, [place] Worker  [place] [phone]

## 2017-04-17 ENCOUNTER — Other Ambulatory Visit: Payer: Self-pay | Admitting: *Deleted

## 2017-04-17 ENCOUNTER — Inpatient Hospital Stay: Payer: Medicare HMO | Admitting: Internal Medicine

## 2017-04-17 ENCOUNTER — Ambulatory Visit (INDEPENDENT_AMBULATORY_CARE_PROVIDER_SITE_OTHER): Payer: Medicare Other | Admitting: Family

## 2017-04-17 VITALS — BP 127/84 | HR 80 | Temp 98.1°F

## 2017-04-17 DIAGNOSIS — R7881 Bacteremia: Secondary | ICD-10-CM

## 2017-04-17 DIAGNOSIS — M4622 Osteomyelitis of vertebra, cervical region: Secondary | ICD-10-CM

## 2017-04-17 NOTE — Progress Notes (Signed)
Subjective:    Patient ID: Adrian Cross, male    DOB: 1945/05/12, 72 y.o.   MRN: [id number]  Chief Complaint  Patient presents with  . Hospitalization Follow-up    MRSA Bacteremia, Post Surgical Infection    HPI:  Adrian Cross is a 72 y.o. male who presents today for a hospitalization follow up.   Adrian Cross was admitted on 02/01/17 after he developed a surgical site infection and MRSA bacteremia following fusion of C3-C6 about 1 month prior to presentation. Neurosurgery performed an I&D on 11/9 with would cultures being positive for MRSA. Blood cultures were repeatedly positive and he was transferred to [facility] for TEE. On a repeat CT he was noted to have a persistent 4.8 x 1.1x 6.8 cm back abscess. TEE revealed a possible vegetation. He was placed on vancomycin and rifampin for 8 weeks of therapy with the goal end date of January 9th. He was discharged to a skilled nursing facility with follow up with ID for early/mid January.  Since leaving the hospital he reports completing the course of vancomycin and rifampin as prescribed with no adverse side effects on January 9th. He has not been on any additional antibiotics since that time. Unfortunately he missed his appointments on 1/2 and 1/22. He continues to have the PICC line in place. Continues to have stiffness located around his cervical spine. Denies fevers, chills, night sweats, weight loss or signs of infection from the surgical site. He is now living with family as he was discharged from the skilled nursing facility yesterday.    No Known Allergies    Outpatient Medications Prior to Visit  Medication Sig Dispense Refill  . acetaminophen (TYLENOL) 325 MG tablet Take 2 tablets (650 mg total) by mouth every 6 (six) hours as needed for mild pain (or Fever >/= 101).    . enoxaparin (LOVENOX) 100 MG/ML injection Inject 1 mL (100 mg total) into the skin daily. 0 Syringe   . feeding supplement, GLUCERNA SHAKE, (GLUCERNA SHAKE) LIQD  Take 237 mLs by mouth 2 (two) times daily between meals.  0  . gabapentin (NEURONTIN) 300 MG capsule Take 300 mg by mouth 2 (two) times daily.    . insulin aspart (NOVOLOG) 100 UNIT/ML injection Inject 0-9 Units into the skin 3 (three) times daily with meals. Sliding scale CBG 70 - 120: 0 units CBG 121 - 150: 1 unit,  CBG 151 - 200: 2 units,  CBG 201 - 250: 3 units,  CBG 251 - 300: 5 units,  CBG 301 - 350: 7 units,  CBG 351 - 400: 9 units   CBG > 400: 9 units and notify your MD 10 mL 11  . insulin aspart protamine- aspart (NOVOLOG MIX 70/30) (70-30) 100 UNIT/ML injection Inject 0.2 mLs (20 Units total) into the skin 2 (two) times daily with a meal. 10 mL 11  . lisinopril (PRINIVIL,ZESTRIL) 40 MG tablet Take 40 mg by mouth daily.    . methocarbamol (ROBAXIN) 500 MG tablet Take 1 tablet (500 mg total) by mouth every 6 (six) hours as needed for muscle spasms. 30 tablet 1  . nicotine (NICODERM CQ - DOSED IN MG/24 HOURS) 14 mg/24hr patch Place 1 patch (14 mg total) onto the skin daily. 28 patch 0  . pantoprazole (PROTONIX) 40 MG tablet Take 1 tablet (40 mg total) by mouth 2 (two) times daily. 60 tablet 1  . polyethylene glycol (MIRALAX / GLYCOLAX) packet Take 17 g by mouth daily as needed  for moderate constipation. 14 each 0  . rifampin (RIFADIN) 300 MG capsule Take 1 capsule (300 mg total) by mouth 2 (two) times daily. End date is 03/28/17    . senna-docusate (SENOKOT-S) 8.6-50 MG tablet Take 1 tablet by mouth 2 (two) times daily.    . traMADol (ULTRAM) 50 MG tablet Take 1 tablet (50 mg total) by mouth every 6 (six) hours as needed for moderate pain or severe pain. 15 tablet 0   No facility-administered medications prior to visit.      Past Medical History:  Diagnosis Date  . Arthritis    "all over" (02/01/2017)  . Chronic back pain   . GERD (gastroesophageal reflux disease)   . Hypertension   . Omental mass 2015   abd abscess  . Pinched nerve    "back" (10/30/2013)  . Stomach ulcer   . Type II  diabetes mellitus ([place])       Past Surgical History:  Procedure Laterality Date  . CARPAL TUNNEL RELEASE Left 03/2013  . ESOPHAGOGASTRODUODENOSCOPY N/A 11/30/2016   Procedure: ESOPHAGOGASTRODUODENOSCOPY (EGD);  Surgeon: Rogene Houston, MD;  Location: [facility];  Service: Endoscopy;  Laterality: N/A;  . INCISION AND DRAINAGE ABSCESS POSTERIOR CERVICALSPINE  01/2017   growing MRSA/notes 02/01/2017  . KNEE ARTHROSCOPY Left 1980's  . LAPAROTOMY N/A 11/03/2013   Procedure: EXPLORATORY LAPAROTOMY for resection of omental mass;  Surgeon: Imogene Burn. Georgette Dover, MD;  Location: [facility];  Service: General;  Laterality: N/A;  . PICC LINE INSERTION  01/2017   Archie Endo 02/01/2017  . POSTERIOR FUSION CERVICAL SPINE  01/08/2017   C3-C4, C4-C5, C5-C6 posterior fusion and C3, C4, C5 cervical laminectomy/notes 01/22/2017  . TEE WITHOUT CARDIOVERSION N/A 02/05/2017   Procedure: TRANSESOPHAGEAL ECHOCARDIOGRAM (TEE);  Surgeon: Jerline Pain, MD;  Location: [facility] ENDOSCOPY;  Service: Cardiovascular;  Laterality: N/A;      Family History  Problem Relation Age of Onset  . Diabetes Mellitus II Sister   . Stroke Neg Hx       Social History   Socioeconomic History  . Marital status: Married    Spouse name: Not on file  . Number of children: Not on file  . Years of education: Not on file  . Highest education level: Not on file  Social Needs  . Financial resource strain: Not on file  . Food insecurity - worry: Not on file  . Food insecurity - inability: Not on file  . Transportation needs - medical: Not on file  . Transportation needs - non-medical: Not on file  Occupational History  . Not on file  Tobacco Use  . Smoking status: Former Smoker    Packs/day: 1.00    Years: 54.00    Pack years: 54.00    Types: Cigarettes    Last attempt to quit: 12/18/2016    Years since quitting: 0.3  . Smokeless tobacco: Never Used  Substance and Sexual Activity  . Alcohol use: No  . Drug use: No  . Sexual  activity: No  Other Topics Concern  . Not on file  Social History Narrative  . Not on file    Review of Systems  Constitutional: Negative for activity change, appetite change, chills, diaphoresis, fatigue, fever and unexpected weight change.  Respiratory: Negative for chest tightness, shortness of breath and wheezing.   Cardiovascular: Negative for chest pain, palpitations and leg swelling.  Musculoskeletal: Positive for neck stiffness. Negative for neck pain.  Neurological: Negative for dizziness, weakness, light-headedness and numbness.  Objective:    BP 127/84   Pulse 80   Temp 98.1 F (36.7 C)  Nursing note and vital signs reviewed.  Physical Exam  Constitutional: He is oriented to person, place, and time. He appears well-developed and well-nourished. No distress.  Seated in the wheelchair; pleasant   Neck:  Surgical scar appears well approximated with no evidence of discharge or infection. There is mild tenderness over C3-C6 spinous and transverse processes with no crepitus. Distal pulses, strength and sensation are intact and normal.   Cardiovascular: Normal rate, regular rhythm, normal heart sounds and intact distal pulses.  Pulmonary/Chest: Effort normal and breath sounds normal.  Neurological: He is alert and oriented to person, place, and time.  Skin: Skin is warm and dry.  Psychiatric: He has a normal mood and affect. His behavior is normal. Judgment and thought content normal.        Assessment & Plan:   Problem List Items Addressed This Visit      Musculoskeletal and Integument   Infection of cervical spine [facility])    Adrian Cross completed the course of vancomycin and rifampin as scheduled on January 9th, however has not been on antibiotics since. He is going to needed extended oral antibiotics, however with him being off antibiotics for the past 3 weeks it is concerning for possible repeat infection. We will remove his PICC today and obtain blood work  including blood cultures. We will hold antibiotics pending blood work to ensure no new cultures are needed. If blood work looks okay will start on doxycycline. Follow up and additional testing pending blood work.       Relevant Orders   Culture, blood (single) (Completed)   Culture, blood (single) (Completed)   CBC w/Diff (Completed)   Comprehensive metabolic panel (Completed)   Sedimentation rate (Completed)   C-reactive protein (Completed)     Other   MRSA bacteremia - Primary    Complicated MRSA bacteremia treated with 8 weeks of vancomycin and rifampin. If the source was endocarditis given previous questionable vegetation on TEE, it should be resolved with the 8 weeks of antibiotic therapy. Will obtain blood cultures today and consider rechecking TTE in the near future.       Relevant Orders   Culture, blood (single) (Completed)   Culture, blood (single) (Completed)   CBC w/Diff (Completed)   Comprehensive metabolic panel (Completed)   Sedimentation rate (Completed)   C-reactive protein (Completed)       I am having Adrian Cross maintain his lisinopril, gabapentin, pantoprazole, methocarbamol, acetaminophen, feeding supplement (GLUCERNA SHAKE), insulin aspart protamine- aspart, insulin aspart, nicotine, polyethylene glycol, traMADol, senna-docusate, rifampin, and enoxaparin.   Follow-up: Pending blood work results    Mauricio Po, [facility] for Infectious Disease

## 2017-04-17 NOTE — Patient Outreach (Signed)
Referral received from [place], pt discharged from [facility] skilled nursing facility on 04/16/17, diagnosis diabetes type 2, HTN, recent laminectomy and fusion/ MRSA bacteremia, received a course of IV antibiotics, now staying with his sister in law in [place] (no address in chart), telephone call to Sonia Baller (patient's daughter ) at [phone], no answer to telephone, left voicemail requesting return phone call.  PLAN Outreach pt tomorrow  Jacqlyn Larsen [facility], [place] Coordinator [phone]

## 2017-04-17 NOTE — Patient Outreach (Signed)
Adrian Cross) Care Management  [place]  04/17/2017   Adrian Cross 21-Sep-1945 [id number]  Subjective:  Per daughter, Adrian Cross- "he was released on yesterday"  Objective: [facility] CSW to assist patient and family with community based resources to aide in their well-being, quality of life and overall safety and needs.    Encounter Medications:  Outpatient Encounter Medications as of 04/17/2017  Medication Sig  . acetaminophen (TYLENOL) 325 MG tablet Take 2 tablets (650 mg total) by mouth every 6 (six) hours as needed for mild pain (or Fever >/= 101).  . enoxaparin (LOVENOX) 100 MG/ML injection Inject 1 mL (100 mg total) into the skin daily.  . feeding supplement, GLUCERNA SHAKE, (GLUCERNA SHAKE) LIQD Take 237 mLs by mouth 2 (two) times daily between meals.  . gabapentin (NEURONTIN) 300 MG capsule Take 300 mg by mouth 2 (two) times daily.  . insulin aspart (NOVOLOG) 100 UNIT/ML injection Inject 0-9 Units into the skin 3 (three) times daily with meals. Sliding scale CBG 70 - 120: 0 units CBG 121 - 150: 1 unit,  CBG 151 - 200: 2 units,  CBG 201 - 250: 3 units,  CBG 251 - 300: 5 units,  CBG 301 - 350: 7 units,  CBG 351 - 400: 9 units   CBG > 400: 9 units and notify your MD  . insulin aspart protamine- aspart (NOVOLOG MIX 70/30) (70-30) 100 UNIT/ML injection Inject 0.2 mLs (20 Units total) into the skin 2 (two) times daily with a meal.  . lisinopril (PRINIVIL,ZESTRIL) 40 MG tablet Take 40 mg by mouth daily.  . methocarbamol (ROBAXIN) 500 MG tablet Take 1 tablet (500 mg total) by mouth every 6 (six) hours as needed for muscle spasms.  . nicotine (NICODERM CQ - DOSED IN MG/24 HOURS) 14 mg/24hr patch Place 1 patch (14 mg total) onto the skin daily.  . pantoprazole (PROTONIX) 40 MG tablet Take 1 tablet (40 mg total) by mouth 2 (two) times daily.  . polyethylene glycol (MIRALAX / GLYCOLAX) packet Take 17 g by mouth daily as needed for moderate constipation.  . rifampin (RIFADIN) 300 MG  capsule Take 1 capsule (300 mg total) by mouth 2 (two) times daily. End date is 03/28/17  . senna-docusate (SENOKOT-S) 8.6-50 MG tablet Take 1 tablet by mouth 2 (two) times daily.  . traMADol (ULTRAM) 50 MG tablet Take 1 tablet (50 mg total) by mouth every 6 (six) hours as needed for moderate pain or severe pain.   No facility-administered encounter medications on file as of 04/17/2017.     Functional Status:  In your present state of health, do you have any difficulty performing the following activities: 02/19/2017 02/02/2017  Hearing? - -  Comment - -  Vision? - -  Difficulty concentrating or making decisions? - -  Walking or climbing stairs? - -  Dressing or bathing? - -  Doing errands, shopping? - Y  Preparing Food and eating ? Y -  Using the Toilet? Y -  In the past six months, have you accidently leaked urine? Y -  Do you have problems with loss of bowel control? N -  Managing your Medications? Y -  Managing your Finances? Y -  Housekeeping or managing your Housekeeping? Y -  Some recent data might be hidden    Fall/Depression Screening: Fall Risk  02/19/2017  Falls in the past year? Yes  Number falls in past yr: 2 or more  Injury with Fall? Yes  Risk Factor Category  High Fall Risk  Risk for fall due to : History of fall(s);Impaired balance/gait;Impaired mobility  Follow up Education provided;Falls prevention discussed   PHQ 2/9 Scores 02/19/2017  PHQ - 2 Score 0    Assessment:  CSW has left another message today for SNF rep for update. CSW was able to make contact with daughter, Adrian Cross, who reports her dad was released from SNF to home on yesterday.  "we are waiting for therapy from [place]".  Per daughter, patient is staying with a family member (during the day at one house and at night with another).  She was a bit confused as to reason for my call and our program which I was able to explain to her. She indicates they are still getting things settled for [facility]  therapies, etc, and asked that I call back later in the week.  Per daughter, patient has no current emergent needs; plans to see MD tomorrow.     Plan: CSW will callback later this week for further assessment of needs. CSW will advise [facility] RN of dc to home as well.   Adrian Cross, MSW, [place] Worker  [place] [phone]

## 2017-04-17 NOTE — Patient Outreach (Signed)
[place] [facility]) Care Management  04/17/2017  Adrian Cross 09-11-1945 [id number]   CSW also spoke with daughter, Sonia Baller, who reports she is taking care of her mother full time.  She confirmed patient was released from SNF and is at home in [place] with daughter's aunt (patient's wife's sister).  Daughter reports patient has no current psycho social concerns- was able to get meds filled and the groceries in the home for them.  CSW also explained [facility] role and with patients recent hospital and SNF stay, will ask our [place] to follow up.  CSW will plan f/u call later this week as planned.   Eduard Clos, MSW, [place] Worker  [place] [phone]

## 2017-04-17 NOTE — Patient Instructions (Signed)
Nice to meet you!  We will check your blood work today to determine that the infection is cleared.   Monitor your PICC site for any signs of infection including redness, swelling, or pus-like discharge.   Additional imaging and antibiotics pending the blood work results.

## 2017-04-18 ENCOUNTER — Other Ambulatory Visit: Payer: Self-pay | Admitting: *Deleted

## 2017-04-18 ENCOUNTER — Encounter: Payer: Self-pay | Admitting: Family

## 2017-04-18 LAB — CBC WITH DIFFERENTIAL/PLATELET
BASOS PCT: 0.6 %
Basophils Absolute: 31 cells/uL (ref 0–200)
EOS ABS: 194 {cells}/uL (ref 15–500)
Eosinophils Relative: 3.8 %
HEMATOCRIT: 44 % (ref 38.5–50.0)
Hemoglobin: 14.5 g/dL (ref 13.2–17.1)
LYMPHS ABS: 2183 {cells}/uL (ref 850–3900)
MCH: 30.5 pg (ref 27.0–33.0)
MCHC: 33 g/dL (ref 32.0–36.0)
MCV: 92.4 fL (ref 80.0–100.0)
MONOS PCT: 11.5 %
MPV: 9.9 fL (ref 7.5–12.5)
Neutro Abs: 2106 cells/uL (ref 1500–7800)
Neutrophils Relative %: 41.3 %
Platelets: 151 10*3/uL (ref 140–400)
RBC: 4.76 10*6/uL (ref 4.20–5.80)
RDW: 13 % (ref 11.0–15.0)
Total Lymphocyte: 42.8 %
WBC mixed population: 587 cells/uL (ref 200–950)
WBC: 5.1 10*3/uL (ref 3.8–10.8)

## 2017-04-18 LAB — SEDIMENTATION RATE: SED RATE: 31 mm/h — AB (ref 0–20)

## 2017-04-18 LAB — COMPREHENSIVE METABOLIC PANEL
AG RATIO: 1.2 (calc) (ref 1.0–2.5)
ALT: 12 U/L (ref 9–46)
AST: 13 U/L (ref 10–35)
Albumin: 4.1 g/dL (ref 3.6–5.1)
Alkaline phosphatase (APISO): 81 U/L (ref 40–115)
BILIRUBIN TOTAL: 0.3 mg/dL (ref 0.2–1.2)
BUN: 15 mg/dL (ref 7–25)
CHLORIDE: 104 mmol/L (ref 98–110)
CO2: 26 mmol/L (ref 20–32)
Calcium: 10.2 mg/dL (ref 8.6–10.3)
Creat: 1.09 mg/dL (ref 0.70–1.18)
GLOBULIN: 3.3 g/dL (ref 1.9–3.7)
GLUCOSE: 228 mg/dL — AB (ref 65–99)
Potassium: 4.5 mmol/L (ref 3.5–5.3)
SODIUM: 139 mmol/L (ref 135–146)
TOTAL PROTEIN: 7.4 g/dL (ref 6.1–8.1)

## 2017-04-18 LAB — C-REACTIVE PROTEIN: CRP: 2.6 mg/L (ref <8.0)

## 2017-04-18 NOTE — Assessment & Plan Note (Signed)
Complicated MRSA bacteremia treated with 8 weeks of vancomycin and rifampin. If the source was endocarditis given previous questionable vegetation on TEE, it should be resolved with the 8 weeks of antibiotic therapy. Will obtain blood cultures today and consider rechecking TTE in the near future.

## 2017-04-18 NOTE — Assessment & Plan Note (Signed)
Adrian Cross completed the course of vancomycin and rifampin as scheduled on January 9th, however has not been on antibiotics since. He is going to needed extended oral antibiotics, however with him being off antibiotics for the past 3 weeks it is concerning for possible repeat infection. We will remove his PICC today and obtain blood work including blood cultures. We will hold antibiotics pending blood work to ensure no new cultures are needed. If blood work looks okay will start on doxycycline. Follow up and additional testing pending blood work.

## 2017-04-18 NOTE — Patient Outreach (Signed)
Telephone call to pt for transition of care week 1/  2nd attempt, called patient's daughter Sonia Baller at [phone], no answer to telephone, left voicemail requesting return phone call.  PLAN Outreach pt tomorrow  Jacqlyn Larsen [facility], [place] Coordinator [phone]

## 2017-04-19 ENCOUNTER — Inpatient Hospital Stay: Payer: Medicare HMO | Admitting: Internal Medicine

## 2017-04-19 ENCOUNTER — Other Ambulatory Visit: Payer: Self-pay | Admitting: *Deleted

## 2017-04-19 NOTE — Patient Outreach (Signed)
Telephone call for transition of care week 1/  3rd attempt, no answer to telephone, no option to leave voicemail.  RN CM unable to mail letter due to not knowing patient's address in [place] (address in EMR is for [facility] skilled nursing facility)  [facility] CSW does not know address.    PLAN Outreach pt next week  Jacqlyn Larsen [facility], [place] Coordinator [phone]

## 2017-04-20 ENCOUNTER — Other Ambulatory Visit: Payer: Self-pay | Admitting: *Deleted

## 2017-04-20 NOTE — Patient Outreach (Signed)
[place] [facility]) Care Management  04/20/2017  Adrian Cross 07/26/1945 [id number]   CSW made a follow up call to check in after SNF dc and conversation with patient emergency contacts earlier this week.  Family reports all is going well and they are linked with [facility] and all his needs are being met. Family denies any SW needs and requests no further needs, support or follow up from [place].  CSW will advise [facility] team and PCP of plans to close CSW referral.   Eduard Clos, MSW, [place] Worker  [place] [phone]

## 2017-04-23 ENCOUNTER — Other Ambulatory Visit: Payer: Self-pay | Admitting: Family

## 2017-04-23 LAB — CULTURE, BLOOD (SINGLE)
MICRO NUMBER: 90122884
MICRO NUMBER:: 90122889
Result:: NO GROWTH
SPECIMEN QUALITY:: ADEQUATE

## 2017-04-23 MED ORDER — DOXYCYCLINE HYCLATE 100 MG PO TABS: 100.0000 mg | ORAL_TABLET | Freq: Two times a day (BID) | ORAL | 2 refills | Status: DC

## 2017-04-24 ENCOUNTER — Telehealth: Payer: Self-pay | Admitting: *Deleted

## 2017-04-24 MED ORDER — SULFAMETHOXAZOLE-TRIMETHOPRIM 800-160 MG PO TABS: 1.0000 | ORAL_TABLET | Freq: Two times a day (BID) | ORAL | 2 refills | Status: AC

## 2017-04-24 NOTE — Addendum Note (Signed)
Addended by: Mauricio Po D on: 04/24/2017 10:42 AM   Modules accepted: Orders

## 2017-04-24 NOTE — Telephone Encounter (Signed)
Called daughter to advise that Marya Amsler changed the medication to bactrim and she can pick it up today also that I am mailing the appointment and address as she requested.

## 2017-04-24 NOTE — Telephone Encounter (Signed)
Noted. I have sent Bactrim to his pharmacy.

## 2017-04-24 NOTE — Telephone Encounter (Signed)
Per message from [place] NP called the patient to advise his labs look better and that he prescribed Doxy and he will likely need this for 3 months. Also to schedule a follow up lab in 30 days. Patient requested that I call his daughter and give her all the information. Spoke with the daughter and she advised the pharmacy called her and the Doxy is over $100 a month and they can not afford the Rx and his DM medications. She wants to know if they can be prescribed something different. Advised her will let the provider know and give her a call back once he responds.  She also wants me to mail her address and appointment info to: [address] [place], [place]

## 2017-04-24 NOTE — Telephone Encounter (Signed)
-----   Message from [place], [place] sent at 04/23/2017  8:39 AM EST ----- Please inform patient that his blood work all looks okay. I have sent in a prescription for doxycycline which he will likely need for at least the next 3 months. Please have him follow up in about 1 month for repeat blood work.

## 2017-04-26 ENCOUNTER — Encounter: Payer: Self-pay | Admitting: *Deleted

## 2017-04-26 ENCOUNTER — Other Ambulatory Visit: Payer: Self-pay | Admitting: *Deleted

## 2017-04-26 NOTE — Patient Outreach (Signed)
RN CM has been unable to reach pt or his daughter, multiple voicemail messages left and letter sent with no response.  Case closed.  RN CM faxed letter to primary MD Dr. Iona Beard.  Rn CM does not have address for patient's daughter, the address in EMR is for skilled nursing facility.  Jacqlyn Larsen [place] [facility], [place] Coordinator [phone]

## 2017-05-23 ENCOUNTER — Other Ambulatory Visit: Payer: Medicare Other

## 2017-05-24 ENCOUNTER — Other Ambulatory Visit: Payer: Self-pay | Admitting: *Deleted

## 2017-05-24 DIAGNOSIS — B9562 Methicillin resistant Staphylococcus aureus infection as the cause of diseases classified elsewhere: Secondary | ICD-10-CM

## 2017-05-24 DIAGNOSIS — M4622 Osteomyelitis of vertebra, cervical region: Secondary | ICD-10-CM

## 2017-05-24 DIAGNOSIS — R7881 Bacteremia: Secondary | ICD-10-CM

## 2017-05-28 ENCOUNTER — Other Ambulatory Visit: Payer: Medicare Other

## 2017-05-28 DIAGNOSIS — M4622 Osteomyelitis of vertebra, cervical region: Secondary | ICD-10-CM

## 2017-05-28 DIAGNOSIS — R7881 Bacteremia: Secondary | ICD-10-CM

## 2017-05-29 ENCOUNTER — Telehealth: Payer: Self-pay | Admitting: *Deleted

## 2017-05-29 LAB — COMPLETE METABOLIC PANEL WITH GFR
AG RATIO: 1.3 (calc) (ref 1.0–2.5)
ALKALINE PHOSPHATASE (APISO): 91 U/L (ref 40–115)
ALT: 13 U/L (ref 9–46)
AST: 12 U/L (ref 10–35)
Albumin: 4.3 g/dL (ref 3.6–5.1)
BILIRUBIN TOTAL: 0.3 mg/dL (ref 0.2–1.2)
BUN: 18 mg/dL (ref 7–25)
CHLORIDE: 103 mmol/L (ref 98–110)
CO2: 23 mmol/L (ref 20–32)
Calcium: 10.6 mg/dL — ABNORMAL HIGH (ref 8.6–10.3)
Creat: 1.06 mg/dL (ref 0.70–1.18)
GFR, EST AFRICAN AMERICAN: 82 mL/min/{1.73_m2} (ref >=60)
GFR, Est Non African American: 71 mL/min/{1.73_m2} (ref >=60)
GLUCOSE: 261 mg/dL — AB (ref 65–99)
Globulin: 3.3 g/dL (calc) (ref 1.9–3.7)
POTASSIUM: 4.3 mmol/L (ref 3.5–5.3)
Sodium: 136 mmol/L (ref 135–146)
TOTAL PROTEIN: 7.6 g/dL (ref 6.1–8.1)

## 2017-05-29 LAB — CBC WITH DIFFERENTIAL/PLATELET
BASOS ABS: 40 {cells}/uL (ref 0–200)
Basophils Relative: 0.8 %
EOS PCT: 1.8 %
Eosinophils Absolute: 90 cells/uL (ref 15–500)
HCT: 44 % (ref 38.5–50.0)
HEMOGLOBIN: 15 g/dL (ref 13.2–17.1)
LYMPHS ABS: 2440 {cells}/uL (ref 850–3900)
MCH: 30.8 pg (ref 27.0–33.0)
MCHC: 34.1 g/dL (ref 32.0–36.0)
MCV: 90.3 fL (ref 80.0–100.0)
MONOS PCT: 5.8 %
MPV: 9.7 fL (ref 7.5–12.5)
NEUTROS ABS: 2140 {cells}/uL (ref 1500–7800)
Neutrophils Relative %: 42.8 %
Platelets: 225 10*3/uL (ref 140–400)
RBC: 4.87 10*6/uL (ref 4.20–5.80)
RDW: 12.9 % (ref 11.0–15.0)
Total Lymphocyte: 48.8 %
WBC mixed population: 290 cells/uL (ref 200–950)
WBC: 5 10*3/uL (ref 3.8–10.8)

## 2017-05-29 LAB — C-REACTIVE PROTEIN: CRP: 0.9 mg/L (ref <8.0)

## 2017-05-29 LAB — SEDIMENTATION RATE: SED RATE: 28 mm/h — AB (ref 0–20)

## 2017-05-29 NOTE — Telephone Encounter (Signed)
-----   Message from [place], [place] sent at 05/29/2017  8:23 AM EDT ----- Please inform patient that his labs look okay. We will continue the bactrim for another month.

## 2017-05-29 NOTE — Telephone Encounter (Signed)
Yes please, sorry.

## 2017-05-29 NOTE — Telephone Encounter (Signed)
Per Marya Amsler called the patient to advise that his labs were fine and he needs to follow up in 1 month. To also continue to take the Bactrim for 1 more month. Gave appointment and mailed the information to his daughters address so she can arrange transportation.

## 2017-06-25 NOTE — Progress Notes (Deleted)
Subjective:    Patient ID: Adrian Cross, male    DOB: 03/28/1945, 72 y.o.   MRN: [id number]  No chief complaint on file.    HPI:  Adrian Cross is a 72 y.o. 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   Adrian Cross following hospital cessation for a surgical site infection and MRSA bacteremia.  He completed his course of vancomycin and rifampin as prescribed with no adverse side effects on on 1/9.  However, secondary to to missed appointments a PICC line remained in place which was removed at the office visit and he had not been on any antibiotics for 3 weeks.  He was started on doxycycline which he was unable to tolerate which was changed to Bactrim.  Currently maintained on Bactrim.  Reports taking medication as prescribed and denies adverse side effects.   No Known Allergies    Outpatient Medications Prior to Visit  Medication Sig Dispense Refill  . acetaminophen (TYLENOL) 325 MG tablet Take 2 tablets (650 mg total) by mouth every 6 (six) hours as needed for mild pain (or Fever >/= 101).    . enoxaparin (LOVENOX) 100 MG/ML injection Inject 1 mL (100 mg total) into the skin daily. 0 Syringe   . feeding supplement, GLUCERNA SHAKE, (GLUCERNA SHAKE) LIQD Take 237 mLs by mouth 2 (two) times daily between meals.  0  . gabapentin (NEURONTIN) 300 MG capsule Take 300 mg by mouth 2 (two) times daily.    . insulin aspart (NOVOLOG) 100 UNIT/ML injection Inject 0-9 Units into the skin 3 (three) times daily with meals. Sliding scale CBG 70 - 120: 0 units CBG 121 - 150: 1 unit,  CBG 151 - 200: 2 units,  CBG 201 - 250: 3 units,  CBG 251 - 300: 5 units,  CBG 301 - 350: 7 units,  CBG 351 - 400: 9 units   CBG > 400: 9 units and notify your MD 10 mL 11  . insulin aspart protamine- aspart (NOVOLOG MIX 70/30) (70-30) 100 UNIT/ML injection Inject 0.2 mLs (20 Units total) into the skin 2 (two) times daily with a meal. 10 mL  11  . lisinopril (PRINIVIL,ZESTRIL) 40 MG tablet Take 40 mg by mouth daily.    . methocarbamol (ROBAXIN) 500 MG tablet Take 1 tablet (500 mg total) by mouth every 6 (six) hours as needed for muscle spasms. 30 tablet 1  . nicotine (NICODERM CQ - DOSED IN MG/24 HOURS) 14 mg/24hr patch Place 1 patch (14 mg total) onto the skin daily. 28 patch 0  . pantoprazole (PROTONIX) 40 MG tablet Take 1 tablet (40 mg total) by mouth 2 (two) times daily. 60 tablet 1  . polyethylene glycol (MIRALAX / GLYCOLAX) packet Take 17 g by mouth daily as needed for moderate constipation. 14 each 0  . senna-docusate (SENOKOT-S) 8.6-50 MG tablet Take 1 tablet by mouth 2 (two) times daily.    Marland Kitchen sulfamethoxazole-trimethoprim (BACTRIM DS,SEPTRA DS) 800-160 MG tablet Take 1 tablet by mouth 2 (two) times daily. 60 tablet 2  . traMADol (ULTRAM) 50 MG tablet Take 1 tablet (50 mg total) by mouth every 6 (six) hours as needed for moderate pain or severe pain. 15 tablet 0   No facility-administered medications prior to visit.      Past Medical History:  Diagnosis Date  . Arthritis    "all over" (02/01/2017)  . Chronic back pain   .  GERD (gastroesophageal reflux disease)   . Hypertension   . Omental mass 2015   abd abscess  . Pinched nerve    "back" (10/30/2013)  . Stomach ulcer   . Type II diabetes mellitus ([place])      Past Surgical History:  Procedure Laterality Date  . CARPAL TUNNEL RELEASE Left 03/2013  . ESOPHAGOGASTRODUODENOSCOPY N/A 11/30/2016   Procedure: ESOPHAGOGASTRODUODENOSCOPY (EGD);  Surgeon: Rogene Houston, MD;  [facility];  Service: Endoscopy;  Laterality: N/A;  . INCISION AND DRAINAGE ABSCESS POSTERIOR CERVICALSPINE  01/2017   growing MRSA/notes 02/01/2017  . KNEE ARTHROSCOPY Left 1980's  . LAPAROTOMY N/A 11/03/2013   Procedure: EXPLORATORY LAPAROTOMY for resection of omental mass;  Surgeon: Imogene Burn. Georgette Dover, MD;  Location: [facility];  Service: General;  Laterality: N/A;  . PICC LINE INSERTION   01/2017   Archie Endo 02/01/2017  . POSTERIOR FUSION CERVICAL SPINE  01/08/2017   C3-C4, C4-C5, C5-C6 posterior fusion and C3, C4, C5 cervical laminectomy/notes 01/22/2017  . TEE WITHOUT CARDIOVERSION N/A 02/05/2017   Procedure: TRANSESOPHAGEAL ECHOCARDIOGRAM (TEE);  Surgeon: Jerline Pain, MD;  Location: [facility] ENDOSCOPY;  Service: Cardiovascular;  Laterality: N/A;       Review of Systems  Constitutional: Negative for chills, fever and unexpected weight change.  Respiratory: Negative for chest tightness and shortness of breath.   Cardiovascular: Negative for chest pain, palpitations and leg swelling.  Musculoskeletal: Positive for neck pain and neck stiffness.      Objective:    There were no vitals taken for this visit. Nursing note and vital signs reviewed.  Physical Exam  Constitutional: He is oriented to person, place, and time. He appears well-developed and well-nourished. No distress.  Cardiovascular: Normal rate, regular rhythm, normal heart sounds and intact distal pulses.  Pulmonary/Chest: Effort normal and breath sounds normal.  Neurological: He is alert and oriented to person, place, and time.  Skin: Skin is warm and dry.  Psychiatric: He has a normal mood and affect. His behavior is normal. Judgment and thought content normal.       Assessment & Plan:   Problem List Items Addressed This Visit    None       I am having Adrian Cross maintain his lisinopril, gabapentin, pantoprazole, methocarbamol, acetaminophen, feeding supplement (GLUCERNA SHAKE), insulin aspart protamine- aspart, insulin aspart, nicotine, polyethylene glycol, traMADol, senna-docusate, enoxaparin, and sulfamethoxazole-trimethoprim.   No orders of the defined types were placed in this encounter.    Follow-up: No follow-ups on file.   Terri Piedra, MSN, [facility] Disease

## 2017-06-27 ENCOUNTER — Telehealth: Payer: Self-pay

## 2017-06-27 NOTE — Telephone Encounter (Signed)
Pt's daughter called today with questions on pts appt with Terri Piedra, NP.  On the 15th. Ms. Dalbert Batman was wondering what all would be happening during the appt and if it was something that could be done over the phone. She stated that its too much work for the pt and the daughter to get ready for the appt. She stated it takes them 45 mins to get ready and travel to the clinic. If possible the pt's daughter would like for the visit to be done Via phone call.. Will inform Marya Amsler and see what he would like to do for the pt. [place]

## 2017-07-02 ENCOUNTER — Ambulatory Visit: Payer: Medicare Other | Admitting: Family

## 2017-07-23 ENCOUNTER — Other Ambulatory Visit: Payer: Self-pay | Admitting: Family

## 2017-07-27 ENCOUNTER — Other Ambulatory Visit: Payer: Self-pay | Admitting: Family

## 2017-09-17 DEATH — deceased

## 2018-06-27 IMAGING — CT CT NECK W/ CM
3 of 4 series · 13 of 33 positions shown, 16 images · IV contrast (APPLIED)
Comparison: Prior CT from 01/23/2017.

CLINICAL DATA: Initial evaluation for bacteremia, back pain,
infection of the neck at site of incision.

EXAM:
CT NECK WITH CONTRAST
TECHNIQUE: Multidetector CT imaging of the neck was performed using the
standard protocol following the bolus administration of intravenous
contrast.
CONTRAST:  <See Chart> ZOQYLG-QXX IOPAMIDOL (ZOQYLG-QXX) INJECTION
61%

[Series 3: neck 2.0 i31s 3 · axial · 0.47mm/px · z∈[-261,-87]mm · 5 of 131 slices shown, 7 images]
[im 22/131  soft-tissue]
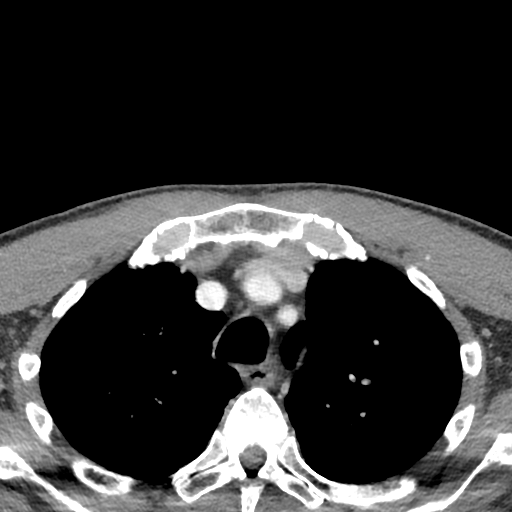
[im 22/131  bone]
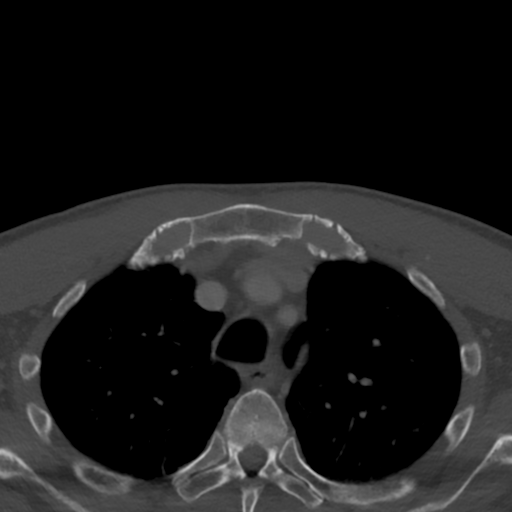
[im 44/131  bone]
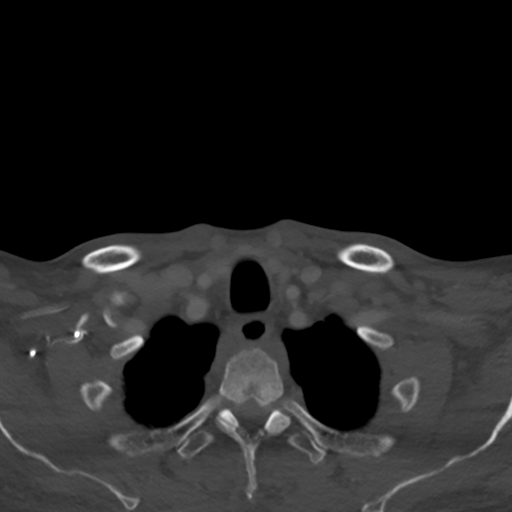
[im 66/131  bone]
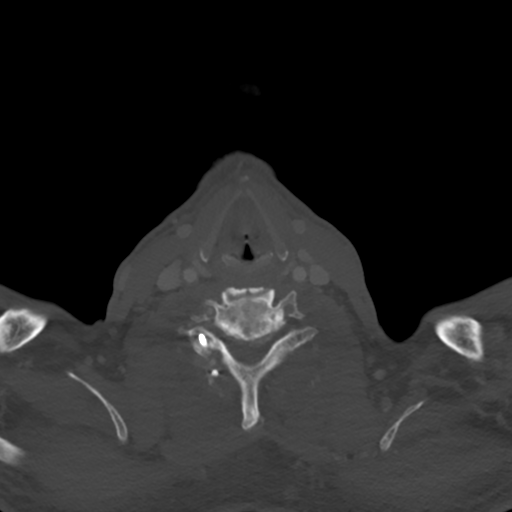
[im 87/131  bone]
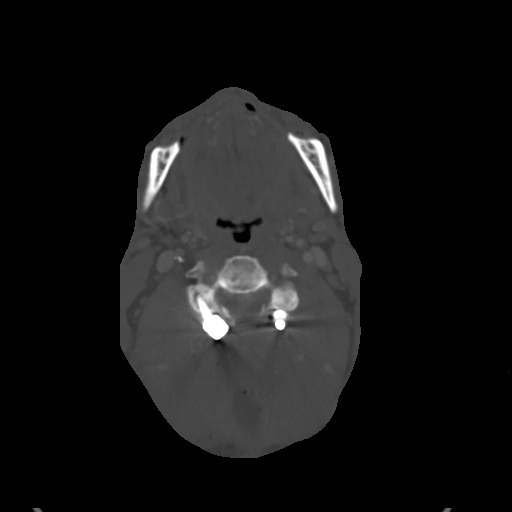
[im 109/131  soft-tissue]
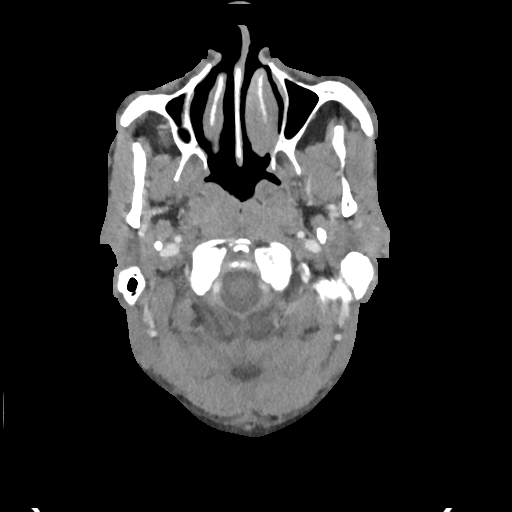
[im 109/131  bone]
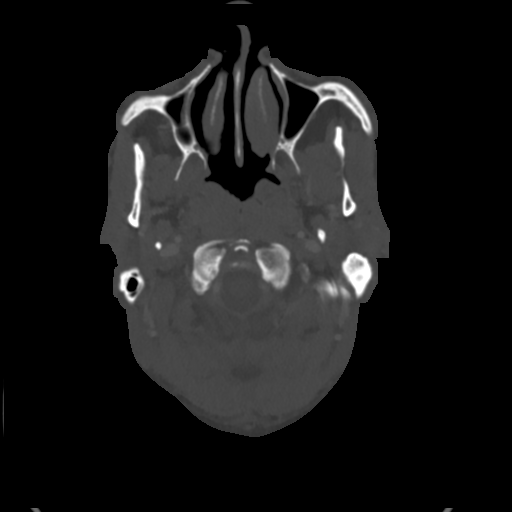

[Series 6: coronal st · coronal · 0.39mm/px · 3 of 132 slices shown]
[im 27/132  bone]
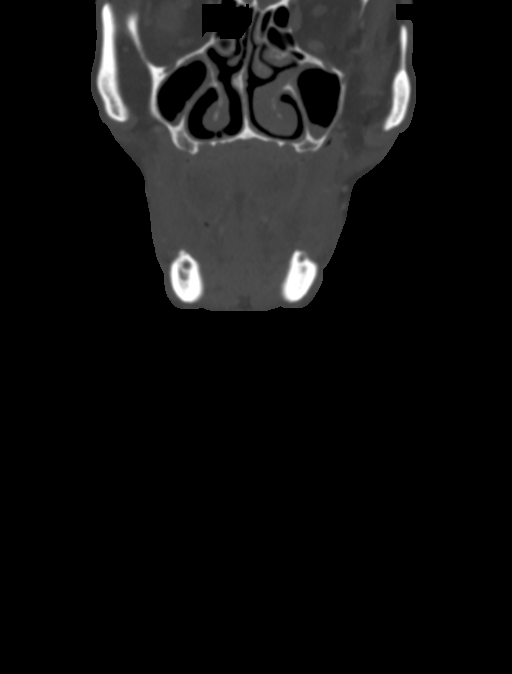
[im 53/132  bone]
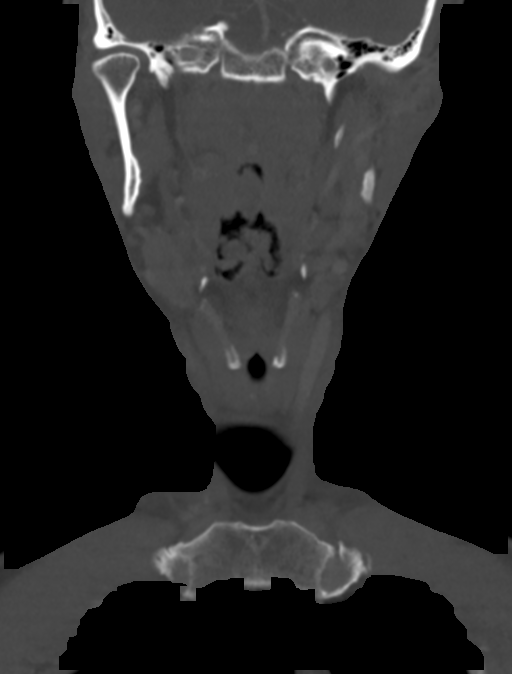
[im 79/132  bone]
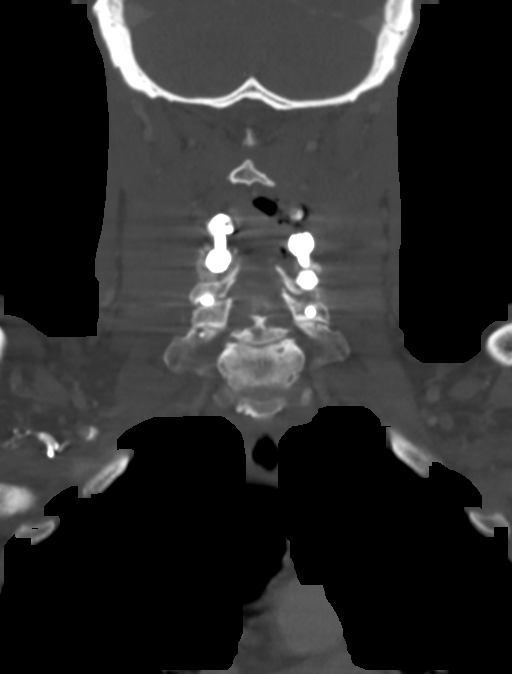

[Series 7: sagittal st · sagittal · 0.51mm/px · 5 of 101 slices shown, 6 images]
[im 34/101  bone]
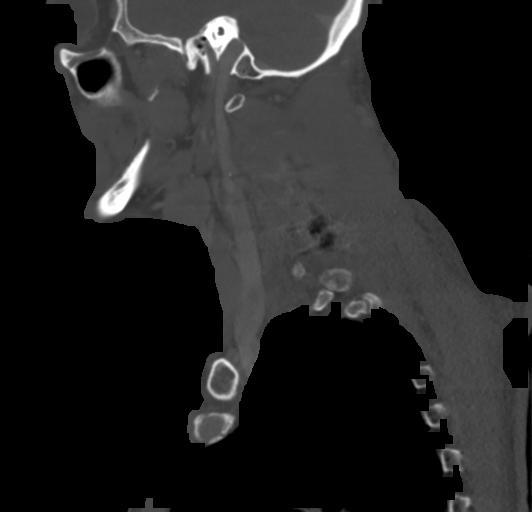
[im 42/101  bone]
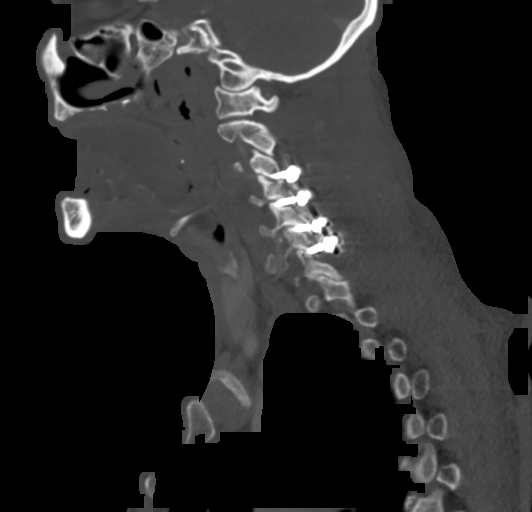
[im 51/101  soft-tissue]
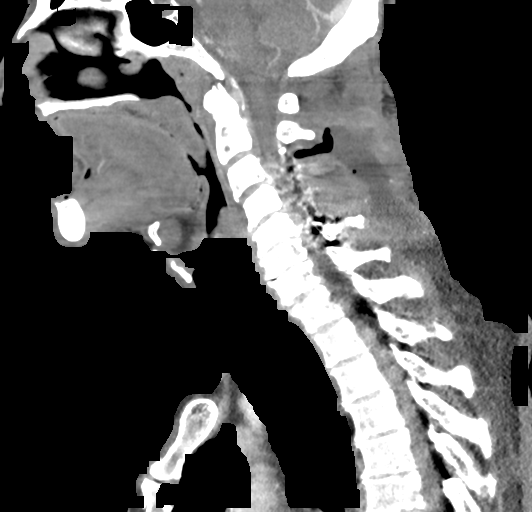
[im 51/101  bone]
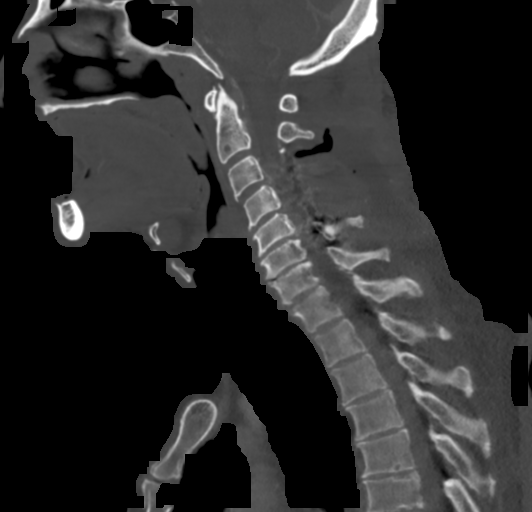
[im 59/101  bone]
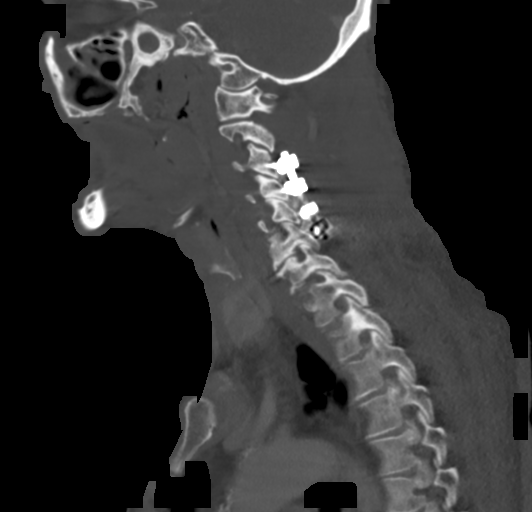
[im 67/101  bone]
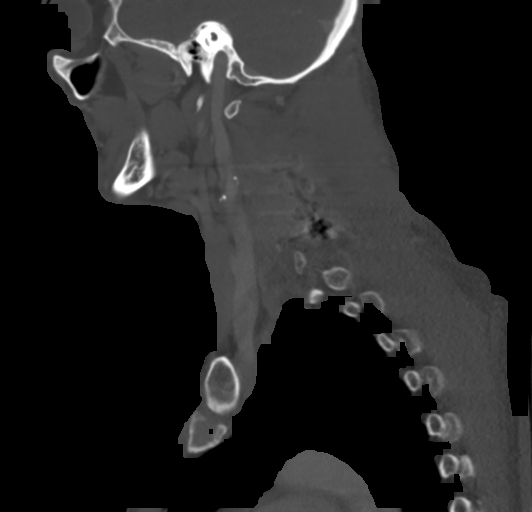

[13 of 33 positions shown; findings below may reference images not displayed]

FINDINGS: Pharynx and larynx: Oral cavity within normal limits. Patient is
edentulous. Palatine tonsils symmetric and within normal limits.
Calcified tonsilliths noted. Remainder of the oropharynx and
nasopharynx within normal limits. Retropharyngeal soft tissues
within normal limits. Epiglottis normal. Vallecula clear. Remainder
of the hypopharynx and supraglottic larynx normal. Subglottic airway
clear.

Salivary glands: Unremarkable.

Thyroid: Unremarkable.

Lymph nodes: No pathologically enlarged lymph nodes identified
within the neck.

Vascular: Aortic atherosclerosis with prominent carotid bifurcation
calcifications. Carotid siphon calcifications noted as well.

Limited intracranial: Unremarkable.

Visualized orbits: Visualized globes and orbital soft tissues within
normal limits.

Mastoids and visualized paranasal sinuses: Chronic left sphenoid
sinusitis. Mastoids and middle ear cavities are clear.

Skeleton: Postoperative changes from prior laminectomies at C3
through C5, with posterior instrumentation at C3 through C6.
Hardware well positioned without complication. Multilevel
degenerative spondylolysis again noted

Upper chest: Visualized upper mediastinum within normal limits.
Severe centrilobular and bullous emphysema.

Other: Irregular gas and fluid collection along the mid midline
posterior incision measures 4.8 x 1.1 x 6.8 cm (AP by transverse by
craniocaudad). This lies at the level of C3 through C7. Mildly
thickened rim enhancement about this collection as compared to
previous adjacent paraspinous muscular edema slightly more prominent
as compared to previous exam.
IMPRESSION: 1. Persistent 4.8 x 1.1 x 6.8 cm gas and fluid containing paraspinal
collection along the posterior surgical approach. Again, finding may
reflect persistent postoperative seroma or possibly abscess.
Percutaneous sampling recommended if concern for infection.
2. Status post C3 through C5 laminectomy with posterior
instrumentation at C3 through C6.
3. Atherosclerosis with severe emphysema.
